# Patient Record
Sex: Male | Born: 1965 | Race: Black or African American | Hispanic: No | Marital: Married | Smoking: Never smoker
Health system: Southern US, Community
[De-identification: ages and names within clinical notes are randomized; demographics above are authoritative.]

## PROBLEM LIST (undated history)

## (undated) DIAGNOSIS — I10 Essential (primary) hypertension: Secondary | ICD-10-CM

## (undated) DIAGNOSIS — S83249A Other tear of medial meniscus, current injury, unspecified knee, initial encounter: Secondary | ICD-10-CM

## (undated) DIAGNOSIS — K219 Gastro-esophageal reflux disease without esophagitis: Secondary | ICD-10-CM

## (undated) HISTORY — PX: WISDOM TOOTH EXTRACTION: SHX21

---

## 2005-02-21 ENCOUNTER — Emergency Department (HOSPITAL_COMMUNITY): Admission: EM | Admit: 2005-02-21 | Discharge: 2005-02-21 | Payer: Self-pay | Admitting: Emergency Medicine
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
## 2007-05-17 ENCOUNTER — Ambulatory Visit: Payer: Self-pay | Admitting: Family Medicine

## 2007-05-17 DIAGNOSIS — G43009 Migraine without aura, not intractable, without status migrainosus: Secondary | ICD-10-CM | POA: Insufficient documentation

## 2009-08-15 ENCOUNTER — Ambulatory Visit: Payer: Self-pay | Admitting: Family Medicine

## 2012-02-21 ENCOUNTER — Emergency Department (HOSPITAL_COMMUNITY)
Admission: EM | Admit: 2012-02-21 | Discharge: 2012-02-21 | Disposition: A | Discharge status: Home / Self Care | Payer: BC Managed Care – PPO | Source: Home / Self Care | Attending: Emergency Medicine | Admitting: Emergency Medicine

## 2012-02-21 ENCOUNTER — Encounter (HOSPITAL_COMMUNITY): Payer: Self-pay | Admitting: *Deleted

## 2012-02-21 DIAGNOSIS — S40269A Insect bite (nonvenomous) of unspecified shoulder, initial encounter: Secondary | ICD-10-CM

## 2012-02-21 DIAGNOSIS — Z0000 Encounter for general adult medical examination without abnormal findings: Secondary | ICD-10-CM

## 2012-02-21 HISTORY — DX: Essential (primary) hypertension: I10

## 2012-02-21 NOTE — Discharge Instructions (Signed)
You do need to find a primary care physician. Below are practices accepting new patients. Return to the ED for any of the symptoms we talked about.   [facility]: [address] [place] [address] [phone]  [facility]: [address] [place] [address]   [phone]  [facility]: [address] [place] [address] [phone]  [facility] : [address] [place] [address] [phone]  [facility]: [address] [place] [address] [phone]  [facility]: [address] [place] [address] [phone]  Dr. Oneal Grout [address] [facility] [place] [address] [phone]

## 2012-02-21 NOTE — ED Notes (Signed)
Reports noticing tick to posterior right shoulder this morning "almost embedded in my skin".  States wife removed tick with tweezers.  Upon assessment of area, unable to determine exact location of tick bite.  Pt c/o some nausea today.

## 2012-02-21 NOTE — ED Provider Notes (Signed)
History     CSN: [id number]  Arrival date & time 02/21/12  1309   First MD Initiated Contact with Patient 02/21/12 1358      Chief Complaint  Patient presents with  . Tick Removal    (Consider location/radiation/quality/duration/timing/severity/associated sxs/prior treatment) HPI Comments: Patient presents for a variation of a possible tick bite. He states that he found a large, non-engorged tick embedded in his posterior right shoulder earlier today. His wife removed it with tweezers. He wants to make sure that she got "everything out". He does not know where the tick was exactly. Reports some nausea after having the tick removed, but no vomiting. No fevers, redness, pain at the site where the tick was. Tetanus is up-to-date.   ROS as noted in HPI. All other ROS negative.   The history is provided by the patient. No language interpreter was used.    Past Medical History  Diagnosis Date  . Hypertension     Was told "pre-hypertension" - no meds needed    History reviewed. No pertinent past surgical history.  History reviewed. No pertinent family history.  History  Substance Use Topics  . Smoking status: Never Smoker   . Smokeless tobacco: Not on file  . Alcohol Use: Yes     occasional      Review of Systems  Allergies  Review of patient's allergies indicates no known allergies.  Home Medications  No current outpatient prescriptions on file.  BP 130/90  Pulse 52  Temp 97.9 F (36.6 C) (Oral)  Resp 16  SpO2 98%  Physical Exam  Nursing note and vitals reviewed. Constitutional: He is oriented to person, place, and time. He appears well-developed and well-nourished.  HENT:  Head: Normocephalic and atraumatic.  Eyes: Conjunctivae and EOM are normal.  Neck: Normal range of motion.  Cardiovascular: Normal rate.   Pulmonary/Chest: Effort normal. No respiratory distress.  Abdominal: He exhibits no distension.  Musculoskeletal: Normal range of motion.   Right shoulder: Normal.       Skin intact, no signs of tick bite of right shoulder, or in right upper back  Neurological: He is alert and oriented to person, place, and time.  Skin: Skin is warm and dry.  Psychiatric: He has a normal mood and affect. His behavior is normal.    ED Course  Procedures (including critical care time)  Labs Reviewed - No data to display No results found.   1. General medical exam      MDM  Unable to find evidence of tick bite. Skin intact. Pt otherwise well. Discussed signs/symotoims that should prompt his return. Patient agrees with plan.  Luiz Blare, MD 02/21/12 [phone]

## 2015-12-13 ENCOUNTER — Ambulatory Visit (HOSPITAL_COMMUNITY)
Admission: EM | Admit: 2015-12-13 | Discharge: 2015-12-13 | Disposition: A | Discharge status: Home / Self Care | Payer: BLUE CROSS/BLUE SHIELD | Attending: Emergency Medicine | Admitting: Emergency Medicine

## 2015-12-13 ENCOUNTER — Encounter (HOSPITAL_COMMUNITY): Payer: Self-pay

## 2015-12-13 DIAGNOSIS — S86811A Strain of other muscle(s) and tendon(s) at lower leg level, right leg, initial encounter: Secondary | ICD-10-CM

## 2015-12-13 DIAGNOSIS — S86111A Strain of other muscle(s) and tendon(s) of posterior muscle group at lower leg level, right leg, initial encounter: Secondary | ICD-10-CM

## 2015-12-13 MED ORDER — TRAMADOL HCL 50 MG PO TABS: 50.0000 mg | ORAL_TABLET | Freq: Four times a day (QID) | ORAL | Status: DC | PRN

## 2015-12-13 NOTE — Discharge Instructions (Signed)
You have a partially torn your calf muscle. There is no sign of broken bone or completely ruptured muscle or tendon. Keep her leg elevated and apply ice as much as you can. This will help with the swelling. Take ibuprofen every 6 hours for pain. Keep it wrapped with Ace wrap for some compression. You can remove this to shower. Use the crutches for the next several days. You will likely be able to start bearing more weight in 2-3 days. This will take 2 weeks to fully heal. If things are not improving by next week, please follow-up with Dr. Lorin Mercy, an orthopedic specialist.
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
## 2016-02-14 ENCOUNTER — Encounter (HOSPITAL_COMMUNITY): Payer: Self-pay | Admitting: Emergency Medicine

## 2016-02-14 ENCOUNTER — Emergency Department (HOSPITAL_COMMUNITY)
Admission: EM | Admit: 2016-02-14 | Discharge: 2016-02-14 | Disposition: A | Discharge status: Home / Self Care | Payer: BLUE CROSS/BLUE SHIELD | Attending: Emergency Medicine | Admitting: Emergency Medicine

## 2016-02-14 DIAGNOSIS — Y92410 Unspecified street and highway as the place of occurrence of the external cause: Secondary | ICD-10-CM | POA: Insufficient documentation

## 2016-02-14 DIAGNOSIS — I10 Essential (primary) hypertension: Secondary | ICD-10-CM | POA: Insufficient documentation

## 2016-02-14 DIAGNOSIS — M542 Cervicalgia: Secondary | ICD-10-CM | POA: Insufficient documentation

## 2016-02-14 DIAGNOSIS — M25512 Pain in left shoulder: Secondary | ICD-10-CM | POA: Insufficient documentation

## 2016-02-14 DIAGNOSIS — Y939 Activity, unspecified: Secondary | ICD-10-CM | POA: Insufficient documentation

## 2016-02-14 DIAGNOSIS — Y999 Unspecified external cause status: Secondary | ICD-10-CM | POA: Diagnosis not present

## 2016-02-14 MED ORDER — METHOCARBAMOL 500 MG PO TABS: 500.0000 mg | ORAL_TABLET | Freq: Two times a day (BID) | ORAL | Status: DC

## 2016-02-14 MED ORDER — NAPROXEN 500 MG PO TABS: 500.0000 mg | ORAL_TABLET | Freq: Two times a day (BID) | ORAL | Status: DC
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
## 2017-08-12 DIAGNOSIS — M542 Cervicalgia: Secondary | ICD-10-CM | POA: Diagnosis not present

## 2017-08-12 DIAGNOSIS — M5416 Radiculopathy, lumbar region: Secondary | ICD-10-CM | POA: Diagnosis not present

## 2017-11-11 ENCOUNTER — Ambulatory Visit (INDEPENDENT_AMBULATORY_CARE_PROVIDER_SITE_OTHER): Payer: 59 | Admitting: Family Medicine

## 2017-11-11 ENCOUNTER — Encounter: Payer: Self-pay | Admitting: Family Medicine

## 2017-11-11 ENCOUNTER — Other Ambulatory Visit: Payer: Self-pay

## 2017-11-11 VITALS — BP 124/81 | HR 68 | Temp 98.7°F | Resp 16 | Ht 66.25 in | Wt 187.8 lb

## 2017-11-11 DIAGNOSIS — H1032 Unspecified acute conjunctivitis, left eye: Secondary | ICD-10-CM

## 2017-11-11 MED ORDER — POLYMYXIN B-TRIMETHOPRIM 10000-0.1 UNIT/ML-% OP SOLN: 1.0000 [drp] | Freq: Four times a day (QID) | OPHTHALMIC | 0 refills | Status: DC

## 2017-11-11 NOTE — Patient Instructions (Signed)
     IF you received an x-ray today, you will receive an invoice from [facility]. Please contact [facility] at [phone] with questions or concerns regarding your invoice.   IF you received labwork today, you will receive an invoice from [facility]. Please contact [facility] at [phone] with questions or concerns regarding your invoice.   Our billing staff will not be able to assist you with questions regarding bills from these companies.  You will be contacted with the lab results as soon as they are available. The fastest way to get your results is to activate your My Chart account. Instructions are located on the last page of this paperwork. If you have not heard from us regarding the results in 2 weeks, please contact this office.     

## 2017-11-11 NOTE — Progress Notes (Signed)
   3/14/201910:14 AM  Jared Park [birth year], 52 y.o. male [id number]  Chief Complaint  Patient presents with  . New Patient (Initial Visit)    ? conjunctivitis, st x 1 week.  no fevers noticed. Eyes red with mucus drainage this morning.    HPI:   Patient is a 52 y.o. male  who presents today for 1 day of red crusty eye. Has been having a sore throat and nasal congestion for past several days. He denies any vision changes, photophobia, pain in the eye, FB sensation. He denies any fever, chills, cough, ear pain, SOB.   Depression screen PHQ 2/9 11/11/2017  Decreased Interest 0  Down, Depressed, Hopeless 0  PHQ - 2 Score 0    No Known Allergies  Prior to Admission medications   Not on File    Past Medical History:  Diagnosis Date  . Hypertension    Was told "pre-hypertension" - no meds needed    History reviewed. No pertinent surgical history.  Social History   Tobacco Use  . Smoking status: Never Smoker  . Smokeless tobacco: Never Used  Substance Use Topics  . Alcohol use: Yes    Comment: occasional    Family History  Problem Relation Age of Onset  . Diabetes Mother   . Early death Mother   . Hyperlipidemia Mother   . Hypertension Mother   . Stroke Father     ROS Per hpi  OBJECTIVE:  Blood pressure 124/81, pulse 68, temperature 98.7 F (37.1 C), temperature source Oral, resp. rate 16, height 5' 6.25" (1.683 m), weight 187 lb 12.8 oz (85.2 kg), SpO2 96 %.   Physical Exam  Constitutional: He is oriented to person, place, and time and well-developed, well-nourished, and in no distress.  HENT:  Head: Normocephalic and atraumatic.  Right Ear: Hearing, tympanic membrane, external ear and ear canal normal.  Left Ear: Hearing, tympanic membrane, external ear and ear canal normal.  Mouth/Throat: Oropharynx is clear and moist. No oropharyngeal exudate.  Eyes: EOM are normal. Pupils are equal, round, and reactive to light. Right eye exhibits chemosis and  discharge. Left eye exhibits no chemosis and no discharge. Right conjunctiva is injected. Left conjunctiva is not injected.  Neck: Neck supple.  Cardiovascular: Normal rate and regular rhythm. Exam reveals no gallop and no friction rub.  No murmur heard. Pulmonary/Chest: Effort normal and breath sounds normal. He has no wheezes. He has no rales.  Lymphadenopathy:    He has no cervical adenopathy.  Neurological: He is alert and oriented to person, place, and time. Gait normal.  Skin: Skin is warm and dry.     ASSESSMENT and PLAN  1. Acute conjunctivitis of left eye, unspecified acute conjunctivitis type Discussed supportive measures, new meds r/se/b and RTC precautions. - trimethoprim-polymyxin b (POLYTRIM) ophthalmic solution; Place 1 drop into the left eye every 6 (six) hours.  Return if symptoms worsen or fail to improve.    Rutherford Guys, MD Primary Care at [place], [place] [address] Ph.  [phone] Fax [phone]

## 2017-11-29 ENCOUNTER — Other Ambulatory Visit: Payer: Self-pay | Admitting: Family Medicine

## 2017-11-29 MED ORDER — POLYMYXIN B-TRIMETHOPRIM 10000-0.1 UNIT/ML-% OP SOLN: 1.0000 [drp] | Freq: Four times a day (QID) | OPHTHALMIC | 1 refills | Status: DC

## 2018-02-23 ENCOUNTER — Other Ambulatory Visit: Payer: Self-pay

## 2018-02-23 ENCOUNTER — Ambulatory Visit (INDEPENDENT_AMBULATORY_CARE_PROVIDER_SITE_OTHER): Payer: 59 | Admitting: Family Medicine

## 2018-02-23 ENCOUNTER — Encounter: Payer: Self-pay | Admitting: Family Medicine

## 2018-02-23 VITALS — BP 120/86 | HR 63 | Temp 98.7°F | Ht 67.72 in | Wt 185.0 lb

## 2018-02-23 DIAGNOSIS — Z1329 Encounter for screening for other suspected endocrine disorder: Secondary | ICD-10-CM

## 2018-02-23 DIAGNOSIS — Z23 Encounter for immunization: Secondary | ICD-10-CM

## 2018-02-23 DIAGNOSIS — Z13220 Encounter for screening for lipoid disorders: Secondary | ICD-10-CM | POA: Diagnosis not present

## 2018-02-23 DIAGNOSIS — Z13228 Encounter for screening for other metabolic disorders: Secondary | ICD-10-CM

## 2018-02-23 DIAGNOSIS — Z130 Encounter for screening for diseases of the blood and blood-forming organs and certain disorders involving the immune mechanism: Secondary | ICD-10-CM

## 2018-02-23 DIAGNOSIS — Z125 Encounter for screening for malignant neoplasm of prostate: Secondary | ICD-10-CM | POA: Diagnosis not present

## 2018-02-23 DIAGNOSIS — Z0000 Encounter for general adult medical examination without abnormal findings: Secondary | ICD-10-CM | POA: Diagnosis not present

## 2018-02-23 NOTE — Progress Notes (Signed)
6/26/201910:43 AM  Jared Park 10-28-1965, 52 y.o. male [id number]  Chief Complaint  Patient presents with  . Annual Exam    HPI:   Patient is a 52 y.o. male who presents today for CPE  Colorectal Cancer Screening: not of age Prostate Cancer Screening: needs today Seasonal Influenza Vaccination: will need this season Td/Tdap Vaccination: needs today Pneumococcal Vaccination: not of age Zoster Vaccination: not of age Frequency of Dental evaluation: Q6 months Frequency of Eye evaluation: none, no eye concerns   Fall Risk  02/23/2018 11/11/2017  Falls in the past year? No No     Depression screen [facility] 2/9 02/23/2018 11/11/2017  Decreased Interest 0 0  Down, Depressed, Hopeless 0 0  PHQ - 2 Score 0 0    No Known Allergies  Prior to Admission medications   Not on File    Past Medical History:  Diagnosis Date  . Hypertension    Was told "pre-hypertension" - no meds needed    History reviewed. No pertinent surgical history.  Social History   Tobacco Use  . Smoking status: Never Smoker  . Smokeless tobacco: Never Used  Substance Use Topics  . Alcohol use: Yes    Comment: occasional    Family History  Problem Relation Age of Onset  . Diabetes Mother   . Hyperlipidemia Mother   . Hypertension Mother   . Stroke Father   . Healthy Sister   . Healthy Brother     Review of Systems  Constitutional: Negative for chills and fever.  HENT: Negative for congestion, ear pain, hearing loss and sore throat.   Eyes: Negative for blurred vision and double vision.  Respiratory: Negative for cough and shortness of breath.   Cardiovascular: Negative for chest pain, palpitations and leg swelling.  Gastrointestinal: Negative for abdominal pain, nausea and vomiting.  Genitourinary: Negative for dysuria, frequency and hematuria.  Musculoskeletal: Negative for falls, joint pain and myalgias.  Neurological: Negative for dizziness, tingling and headaches.    Psychiatric/Behavioral: Negative for depression. The patient is not nervous/anxious and does not have insomnia.   All other systems reviewed and are negative.    OBJECTIVE:  Blood pressure 120/86, pulse 63, temperature 98.7 F (37.1 C), temperature source Oral, height 5' 7.72" (1.72 m), weight 185 lb (83.9 kg), SpO2 98 %.  Body mass index is 28.36 kg/m.    Visual Acuity Screening   Right eye Left eye Both eyes  Without correction: 20/15 20/20 20/20   With correction:       Physical Exam  Constitutional: He is oriented to person, place, and time. He appears well-developed and well-nourished.  HENT:  Head: Normocephalic and atraumatic.  Right Ear: Hearing, tympanic membrane, external ear and ear canal normal.  Left Ear: Hearing, tympanic membrane, external ear and ear canal normal.  Mouth/Throat: Oropharynx is clear and moist. No oropharyngeal exudate.  Eyes: Pupils are equal, round, and reactive to light. Conjunctivae and EOM are normal.  Neck: Neck supple. No thyromegaly present.  Cardiovascular: Normal rate, regular rhythm, normal heart sounds and intact distal pulses. Exam reveals no gallop and no friction rub.  No murmur heard. Pulmonary/Chest: Effort normal and breath sounds normal. He has no wheezes. He has no rhonchi. He has no rales.  Abdominal: Soft. Bowel sounds are normal. He exhibits no distension and no mass. There is no tenderness.  Musculoskeletal: Normal range of motion. He exhibits no edema.  Lymphadenopathy:    He has no cervical adenopathy.  Neurological: He is alert  and oriented to person, place, and time. He has normal strength and normal reflexes. No cranial nerve deficit. Coordination and gait normal.  Skin: Skin is warm and dry.  Psychiatric: He has a normal mood and affect.  Nursing note and vitals reviewed.   ASSESSMENT and PLAN  1. Annual physical exam No concerns per history or exam. Routine HCM labs ordered. HCM reviewed/discussed. Anticipatory  guidance regarding healthy weight, lifestyle and choices given.   2. Screening for metabolic disorder - JPE16+KOEC  3. Screening for prostate cancer - PSA  4. Screening for lipid disorders - Lipid panel  5. Screening for deficiency anemia - CBC with Differential/Platelet  6. Screening for thyroid disorder - TSH  7. Need for vaccination - Td vaccine greater than or equal to 7yo preservative free IM  Return in about 1 year (around 02/24/2019).    Rutherford Guys, MD Primary Care at [name], [place] [address] Ph.  [phone] Fax [phone]

## 2018-02-23 NOTE — Patient Instructions (Addendum)
   IF you received an x-ray today, you will receive an invoice from [facility]. Please contact [facility] at [phone] with questions or concerns regarding your invoice.   IF you received labwork today, you will receive an invoice from [facility]. Please contact [facility] at [phone] with questions or concerns regarding your invoice.   Our billing staff will not be able to assist you with questions regarding bills from these companies.  You will be contacted with the lab results as soon as they are available. The fastest way to get your results is to activate your My Chart account. Instructions are located on the last page of this paperwork. If you have not heard from us regarding the results in 2 weeks, please contact this office.     Preventive Care 40-64 Years, Male Preventive care refers to lifestyle choices and visits with your health care provider that can promote health and wellness. What does preventive care include?  A yearly physical exam. This is also called an annual well check.  Dental exams once or twice a year.  Routine eye exams. Ask your health care provider how often you should have your eyes checked.  Personal lifestyle choices, including: ? Daily care of your teeth and gums. ? Regular physical activity. ? Eating a healthy diet. ? Avoiding tobacco and drug use. ? Limiting alcohol use. ? Practicing safe sex. ? Taking low-dose aspirin every day starting at age 50. What happens during an annual well check? The services and screenings done by your health care provider during your annual well check will depend on your age, overall health, lifestyle risk factors, and family history of disease. Counseling Your health care provider may ask you questions about your:  Alcohol use.  Tobacco use.  Drug use.  Emotional well-being.  Home and relationship well-being.  Sexual activity.  Eating habits.  Work and work  environment.  Screening You may have the following tests or measurements:  Height, weight, and BMI.  Blood pressure.  Lipid and cholesterol levels. These may be checked every 5 years, or more frequently if you are over 50 years old.  Skin check.  Lung cancer screening. You may have this screening every year starting at age 55 if you have a 30-pack-year history of smoking and currently smoke or have quit within the past 15 years.  Fecal occult blood test (FOBT) of the stool. You may have this test every year starting at age 50.  Flexible sigmoidoscopy or colonoscopy. You may have a sigmoidoscopy every 5 years or a colonoscopy every 10 years starting at age 50.  Prostate cancer screening. Recommendations will vary depending on your family history and other risks.  Hepatitis C blood test.  Hepatitis B blood test.  Sexually transmitted disease (STD) testing.  Diabetes screening. This is done by checking your blood sugar (glucose) after you have not eaten for a while (fasting). You may have this done every 1-3 years.  Discuss your test results, treatment options, and if necessary, the need for more tests with your health care provider. Vaccines Your health care provider may recommend certain vaccines, such as:  Influenza vaccine. This is recommended every year.  Tetanus, diphtheria, and acellular pertussis (Tdap, Td) vaccine. You may need a Td booster every 10 years.  Varicella vaccine. You may need this if you have not been vaccinated.  Zoster vaccine. You may need this after age 60.  Measles, mumps, and rubella (MMR) vaccine. You may need at least one dose of   MMR if you were born in 1957 or later. You may also need a second dose.  Pneumococcal 13-valent conjugate (PCV13) vaccine. You may need this if you have certain conditions and have not been vaccinated.  Pneumococcal polysaccharide (PPSV23) vaccine. You may need one or two doses if you smoke cigarettes or if you have  certain conditions.  Meningococcal vaccine. You may need this if you have certain conditions.  Hepatitis A vaccine. You may need this if you have certain conditions or if you travel or work in places where you may be exposed to hepatitis A.  Hepatitis B vaccine. You may need this if you have certain conditions or if you travel or work in places where you may be exposed to hepatitis B.  Haemophilus influenzae type b (Hib) vaccine. You may need this if you have certain risk factors.  Talk to your health care provider about which screenings and vaccines you need and how often you need them. This information is not intended to replace advice given to you by your health care provider. Make sure you discuss any questions you have with your health care provider. Document Released: 09/13/2015 Document Revised: 05/06/2016 Document Reviewed: 06/18/2015 Elsevier Interactive Patient Education  2018 [employer]  

## 2018-02-24 LAB — CMP14+EGFR
ALT: 23 IU/L (ref 0–44)
AST: 16 IU/L (ref 0–40)
Albumin/Globulin Ratio: 1.7 (ref 1.2–2.2)
Albumin: 4.2 g/dL (ref 3.5–5.5)
Alkaline Phosphatase: 72 IU/L (ref 39–117)
BUN/Creatinine Ratio: 9 (ref 9–20)
BUN: 10 mg/dL (ref 6–24)
Bilirubin Total: 0.4 mg/dL (ref 0.0–1.2)
CO2: 23 mmol/L (ref 20–29)
Calcium: 9 mg/dL (ref 8.7–10.2)
Chloride: 102 mmol/L (ref 96–106)
Creatinine, Ser: 1.11 mg/dL (ref 0.76–1.27)
GFR calc Af Amer: 90 mL/min/{1.73_m2} (ref >59)
GFR calc non Af Amer: 78 mL/min/{1.73_m2} (ref >59)
Globulin, Total: 2.5 g/dL (ref 1.5–4.5)
Glucose: 102 mg/dL — ABNORMAL HIGH (ref 65–99)
Potassium: 4.1 mmol/L (ref 3.5–5.2)
Sodium: 140 mmol/L (ref 134–144)
Total Protein: 6.7 g/dL (ref 6.0–8.5)

## 2018-02-24 LAB — CBC WITH DIFFERENTIAL/PLATELET
Basophils Absolute: 0 10*3/uL (ref 0.0–0.2)
Basos: 1 %
EOS (ABSOLUTE): 0 10*3/uL (ref 0.0–0.4)
Eos: 1 %
Hematocrit: 42 % (ref 37.5–51.0)
Hemoglobin: 13.7 g/dL (ref 13.0–17.7)
Immature Grans (Abs): 0 10*3/uL (ref 0.0–0.1)
Immature Granulocytes: 0 %
Lymphocytes Absolute: 1.5 10*3/uL (ref 0.7–3.1)
Lymphs: 39 %
MCH: 27.8 pg (ref 26.6–33.0)
MCHC: 32.6 g/dL (ref 31.5–35.7)
MCV: 85 fL (ref 79–97)
Monocytes Absolute: 0.4 10*3/uL (ref 0.1–0.9)
Monocytes: 9 %
Neutrophils Absolute: 1.9 10*3/uL (ref 1.4–7.0)
Neutrophils: 50 %
Platelets: 310 10*3/uL (ref 150–450)
RBC: 4.93 x10E6/uL (ref 4.14–5.80)
RDW: 13.8 % (ref 12.3–15.4)
WBC: 3.8 10*3/uL (ref 3.4–10.8)

## 2018-02-24 LAB — LIPID PANEL
Chol/HDL Ratio: 3.8 ratio (ref 0.0–5.0)
Cholesterol, Total: 204 mg/dL — ABNORMAL HIGH (ref 100–199)
HDL: 53 mg/dL (ref >39)
LDL Calculated: 131 mg/dL — ABNORMAL HIGH (ref 0–99)
Triglycerides: 101 mg/dL (ref 0–149)
VLDL Cholesterol Cal: 20 mg/dL (ref 5–40)

## 2018-02-24 LAB — PSA: Prostate Specific Ag, Serum: 0.4 ng/mL (ref 0.0–4.0)

## 2018-02-24 LAB — TSH: TSH: 1.45 u[IU]/mL (ref 0.450–4.500)

## 2018-03-09 ENCOUNTER — Encounter: Payer: Self-pay | Admitting: Family Medicine

## 2018-05-09 DIAGNOSIS — S335XXA Sprain of ligaments of lumbar spine, initial encounter: Secondary | ICD-10-CM | POA: Diagnosis not present

## 2018-06-28 DIAGNOSIS — M25512 Pain in left shoulder: Secondary | ICD-10-CM | POA: Diagnosis not present

## 2018-06-28 DIAGNOSIS — S139XXA Sprain of joints and ligaments of unspecified parts of neck, initial encounter: Secondary | ICD-10-CM | POA: Diagnosis not present

## 2018-08-01 DIAGNOSIS — M25522 Pain in left elbow: Secondary | ICD-10-CM | POA: Diagnosis not present

## 2018-08-08 DIAGNOSIS — M7712 Lateral epicondylitis, left elbow: Secondary | ICD-10-CM | POA: Diagnosis not present

## 2019-04-19 ENCOUNTER — Telehealth: Payer: Self-pay

## 2019-04-19 NOTE — Telephone Encounter (Signed)

## 2019-04-20 ENCOUNTER — Encounter: Payer: Self-pay | Admitting: Family Medicine

## 2019-04-20 ENCOUNTER — Encounter: Payer: Self-pay | Admitting: Gastroenterology

## 2019-04-20 ENCOUNTER — Ambulatory Visit (INDEPENDENT_AMBULATORY_CARE_PROVIDER_SITE_OTHER): Payer: 59 | Admitting: Family Medicine

## 2019-04-20 VITALS — BP 128/78 | HR 66 | Temp 96.5°F | Ht 67.72 in | Wt 191.4 lb

## 2019-04-20 DIAGNOSIS — Z23 Encounter for immunization: Secondary | ICD-10-CM

## 2019-04-20 DIAGNOSIS — Z0000 Encounter for general adult medical examination without abnormal findings: Secondary | ICD-10-CM

## 2019-04-20 LAB — CBC
HCT: 43.7 % (ref 39.0–52.0)
Hemoglobin: 13.9 g/dL (ref 13.0–17.0)
MCHC: 31.8 g/dL (ref 30.0–36.0)
MCV: 88.4 fl (ref 78.0–100.0)
Platelets: 309 10*3/uL (ref 150.0–400.0)
RBC: 4.94 Mil/uL (ref 4.22–5.81)
RDW: 14.1 % (ref 11.5–15.5)
WBC: 4.1 10*3/uL (ref 4.0–10.5)

## 2019-04-20 LAB — LIPID PANEL
Cholesterol: 251 mg/dL — ABNORMAL HIGH (ref 0–200)
HDL: 50.5 mg/dL (ref >39.00)
LDL Cholesterol: 170 mg/dL — ABNORMAL HIGH (ref 0–99)
NonHDL: 200.78
Total CHOL/HDL Ratio: 5
Triglycerides: 154 mg/dL — ABNORMAL HIGH (ref 0.0–149.0)
VLDL: 30.8 mg/dL (ref 0.0–40.0)

## 2019-04-20 LAB — COMPREHENSIVE METABOLIC PANEL
ALT: 21 U/L (ref 0–53)
AST: 14 U/L (ref 0–37)
Albumin: 4.3 g/dL (ref 3.5–5.2)
Alkaline Phosphatase: 60 U/L (ref 39–117)
BUN: 14 mg/dL (ref 6–23)
CO2: 26 mEq/L (ref 19–32)
Calcium: 9.1 mg/dL (ref 8.4–10.5)
Chloride: 103 mEq/L (ref 96–112)
Creatinine, Ser: 1.13 mg/dL (ref 0.40–1.50)
GFR: 82.83 mL/min (ref >60.00)
Glucose, Bld: 103 mg/dL — ABNORMAL HIGH (ref 70–99)
Potassium: 4.2 mEq/L (ref 3.5–5.1)
Sodium: 140 mEq/L (ref 135–145)
Total Bilirubin: 0.7 mg/dL (ref 0.2–1.2)
Total Protein: 7 g/dL (ref 6.0–8.3)

## 2019-04-20 LAB — URINALYSIS, ROUTINE W REFLEX MICROSCOPIC
Bilirubin Urine: NEGATIVE
Hgb urine dipstick: NEGATIVE
Ketones, ur: NEGATIVE
Leukocytes,Ua: NEGATIVE
Nitrite: NEGATIVE
RBC / HPF: NONE SEEN (ref 0–?)
Specific Gravity, Urine: 1.025 (ref 1.000–1.030)
Total Protein, Urine: NEGATIVE
Urine Glucose: NEGATIVE
Urobilinogen, UA: 0.2 (ref 0.0–1.0)
pH: 5.5 (ref 5.0–8.0)

## 2019-04-20 LAB — PSA: PSA: 0.36 ng/mL (ref 0.10–4.00)

## 2019-04-20 NOTE — Progress Notes (Addendum)
Established Patient Office Visit  Subjective:  Patient ID: Jared Schlabach., male    DOB: 10/22/1965  Age: 53 y.o. MRN: [id number]  CC:  Chief Complaint  Patient presents with  . Annual Exam    HPI Jared S Shisler Jr. presents for a complete physical.  He is fasting this morning.  He has no health problems that he is aware of.  He is married and lives with his wife.  Works at [employer].  Unable to see the dentist this year.  Has never had an eye check or colonoscopy.  Interested in having a flu shot this year.  Has not been exercising recently.  He does not smoke.  He does drink 2-3 beers 4 to 5 days a week.  Sometimes more on weekends.  Father is 93 and sounds like he is suffering from dementia.  Mom passed at an earlier age and had multiple core issues.  Past Medical History:  Diagnosis Date  . Hypertension    Was told "pre-hypertension" - no meds needed    History reviewed. No pertinent surgical history.  Family History  Problem Relation Age of Onset  . Diabetes Mother   . Hyperlipidemia Mother   . Hypertension Mother   . Stroke Father   . Healthy Sister   . Healthy Brother     Social History   Socioeconomic History  . Marital status: Married    Spouse name: Not on file  . Number of children: Not on file  . Years of education: Not on file  . Highest education level: Not on file  Occupational History  . Not on file  Social Needs  . Financial resource strain: Not on file  . Food insecurity    Worry: Not on file    Inability: Not on file  . Transportation needs    Medical: Not on file    Non-medical: Not on file  Tobacco Use  . Smoking status: Never Smoker  . Smokeless tobacco: Never Used  Substance and Sexual Activity  . Alcohol use: Yes    Comment: 2-3 beers 4-5 days weekly  . Drug use: No  . Sexual activity: Yes  Lifestyle  . Physical activity    Days per week: Not on file    Minutes per session: Not on file  . Stress: Not on file   Relationships  . Social Herbalist on phone: Not on file    Gets together: Not on file    Attends religious service: Not on file    Active member of club or organization: Not on file    Attends meetings of clubs or organizations: Not on file    Relationship status: Not on file  . Intimate partner violence    Fear of current or ex partner: Not on file    Emotionally abused: Not on file    Physically abused: Not on file    Forced sexual activity: Not on file  Other Topics Concern  . Not on file  Social History Narrative  . Not on file    No outpatient medications prior to visit.   No facility-administered medications prior to visit.     No Known Allergies  ROS Review of Systems  Constitutional: Negative.   HENT: Negative.   Eyes: Negative for photophobia and visual disturbance.  Respiratory: Negative.   Cardiovascular: Negative.   Gastrointestinal: Negative.   Endocrine: Negative for polyphagia and polyuria.  Genitourinary: Negative.   Musculoskeletal:  Negative.   Skin: Negative for pallor and rash.  Allergic/Immunologic: Negative for immunocompromised state.  Neurological: Negative for tremors, speech difficulty and weakness.  Hematological: Negative.   Psychiatric/Behavioral: Negative.       Objective:    Physical Exam  Constitutional: He is oriented to person, place, and time. He appears well-developed and well-nourished. No distress.  HENT:  Head: Normocephalic and atraumatic.  Right Ear: External ear normal.  Left Ear: External ear normal.  Mouth/Throat: Oropharynx is clear and moist. No oropharyngeal exudate.  Eyes: Pupils are equal, round, and reactive to light. Conjunctivae are normal. Right eye exhibits no discharge. Left eye exhibits no discharge. No scleral icterus.  Neck: Neck supple. No JVD present. No tracheal deviation present. No thyromegaly present.  Cardiovascular: Normal rate, regular rhythm and normal heart sounds.   Pulmonary/Chest: Effort normal and breath sounds normal. No stridor.  Abdominal: Soft. Bowel sounds are normal. He exhibits no distension. There is no abdominal tenderness. There is no rebound and no guarding.  Genitourinary: Rectum:     Guaiac result negative.     No rectal mass, anal fissure, tenderness, external hemorrhoid, internal hemorrhoid or abnormal anal tone.  Prostate is enlarged. Prostate is not tender.  Musculoskeletal:        General: No edema.     Right shoulder: He exhibits normal range of motion.     Left shoulder: He exhibits normal range of motion.     Lumbar back: He exhibits normal range of motion and no bony tenderness.  Lymphadenopathy:    He has no cervical adenopathy.  Neurological: He is alert and oriented to person, place, and time. He has normal strength. He is not disoriented.  Skin: Skin is warm and dry. He is not diaphoretic.  Psychiatric: He has a normal mood and affect. His behavior is normal.    BP 128/78   Pulse 66   Temp (!) 96.5 F (35.8 C) (Temporal)   Ht 5' 7.72" (1.72 m)   Wt 191 lb 6 oz (86.8 kg)   SpO2 98%   BMI 29.34 kg/m  Wt Readings from Last 3 Encounters:  04/20/19 191 lb 6 oz (86.8 kg)  02/23/18 185 lb (83.9 kg)  11/11/17 187 lb 12.8 oz (85.2 kg)   BP Readings from Last 3 Encounters:  04/20/19 128/78  02/23/18 120/86  11/11/17 124/81   Guideline developer:  UpToDate (see UpToDate for funding source) Date Released: June 2014  Health Maintenance Due  Topic Date Due  . COLONOSCOPY  06/12/2018    There are no preventive care reminders to display for this patient.  Lab Results  Component Value Date   TSH 1.450 02/23/2018   Lab Results  Component Value Date   WBC 4.1 04/20/2019   HGB 13.9 04/20/2019   HCT 43.7 04/20/2019   MCV 88.4 04/20/2019   PLT 309.0 04/20/2019   Lab Results  Component Value Date   NA 140 04/20/2019   K 4.2 04/20/2019   CO2 26 04/20/2019   GLUCOSE 103 (H) 04/20/2019   BUN 14 04/20/2019    CREATININE 1.13 04/20/2019   BILITOT 0.7 04/20/2019   ALKPHOS 60 04/20/2019   AST 14 04/20/2019   ALT 21 04/20/2019   PROT 7.0 04/20/2019   ALBUMIN 4.3 04/20/2019   CALCIUM 9.1 04/20/2019   GFR 82.83 04/20/2019   Lab Results  Component Value Date   CHOL 251 (H) 04/20/2019   Lab Results  Component Value Date   HDL 50.50 04/20/2019  Lab Results  Component Value Date   LDLCALC 170 (H) 04/20/2019   Lab Results  Component Value Date   TRIG 154.0 (H) 04/20/2019   Lab Results  Component Value Date   CHOLHDL 5 04/20/2019   No results found for: HGBA1C    Assessment & Plan:   Problem List Items Addressed This Visit    None    Visit Diagnoses    Healthcare maintenance    -  Primary   Relevant Orders   CBC (Completed)   Comprehensive metabolic panel (Completed)   Lipid panel (Completed)   PSA (Completed)   Urinalysis, Routine w reflex microscopic (Completed)   Ambulatory referral to Gastroenterology   Ambulatory referral to Ophthalmology   Need for immunization against influenza       Relevant Orders   Flu Vaccine QUAD 36+ mos IM (Completed)      No orders of the defined types were placed in this encounter.   Follow-up: Return in about 6 months (around 10/21/2019), or if symptoms worsen or fail to improve.   Patient has dental exam scheduled for November.  Encouraged him to restart his exercise program.  Discussed normal alcohol usage that would be no more than 2 servings in a given setting.  Agrees to go for colonoscopy and an eye check.  Given information on health maintenance and disease prevention.  8/21: Ldl cholesterol is significantly elevated. Have asked patient to lower fat and cholesterol in diet and fu in 6 mos for a recheck.

## 2019-04-20 NOTE — Patient Instructions (Addendum)
Health Maintenance, Male Adopting a healthy lifestyle and getting preventive care are important in promoting health and wellness. Ask your health care provider about:  The right schedule for you to have regular tests and exams.  Things you can do on your own to prevent diseases and keep yourself healthy. What should I know about diet, weight, and exercise? Eat a healthy diet   Eat a diet that includes plenty of vegetables, fruits, low-fat dairy products, and lean protein.  Do not eat a lot of foods that are high in solid fats, added sugars, or sodium. Maintain a healthy weight Body mass index (BMI) is a measurement that can be used to identify possible weight problems. It estimates body fat based on height and weight. Your health care provider can help determine your BMI and help you achieve or maintain a healthy weight. Get regular exercise Get regular exercise. This is one of the most important things you can do for your health. Most adults should:  Exercise for at least 150 minutes each week. The exercise should increase your heart rate and make you sweat (moderate-intensity exercise).  Do strengthening exercises at least twice a week. This is in addition to the moderate-intensity exercise.  Spend less time sitting. Even light physical activity can be beneficial. Watch cholesterol and blood lipids Have your blood tested for lipids and cholesterol at 51 years of age, then have this test every 5 years. You may need to have your cholesterol levels checked more often if:  Your lipid or cholesterol levels are high.  You are older than 51 years of age.  You are at high risk for heart disease. What should I know about cancer screening? Many types of cancers can be detected early and may often be prevented. Depending on your health history and family history, you may need to have cancer screening at various ages. This may include screening for:  Colorectal cancer.  Prostate cancer.   Skin cancer.  Lung cancer. What should I know about heart disease, diabetes, and high blood pressure? Blood pressure and heart disease  High blood pressure causes heart disease and increases the risk of stroke. This is more likely to develop in people who have high blood pressure readings, are of African descent, or are overweight.  Talk with your health care provider about your target blood pressure readings.  Have your blood pressure checked: ? Every 3-5 years if you are 18-39 years of age. ? Every year if you are 40 years old or older.  If you are between the ages of 65 and 75 and are a current or former smoker, ask your health care provider if you should have a one-time screening for abdominal aortic aneurysm (AAA). Diabetes Have regular diabetes screenings. This checks your fasting blood sugar level. Have the screening done:  Once every three years after age 45 if you are at a normal weight and have a low risk for diabetes.  More often and at a younger age if you are overweight or have a high risk for diabetes. What should I know about preventing infection? Hepatitis B If you have a higher risk for hepatitis B, you should be screened for this virus. Talk with your health care provider to find out if you are at risk for hepatitis B infection. Hepatitis C Blood testing is recommended for:  Everyone born from 1945 through 1965.  Anyone with known risk factors for hepatitis C. Sexually transmitted infections (STIs)  You should be screened each year   for STIs, including gonorrhea and chlamydia, if: ? You are sexually active and are younger than 51 years of age. ? You are older than 51 years of age and your health care provider tells you that you are at risk for this type of infection. ? Your sexual activity has changed since you were last screened, and you are at increased risk for chlamydia or gonorrhea. Ask your health care provider if you are at risk.  Ask your health care  provider about whether you are at high risk for HIV. Your health care provider may recommend a prescription medicine to help prevent HIV infection. If you choose to take medicine to prevent HIV, you should first get tested for HIV. You should then be tested every 3 months for as long as you are taking the medicine. Follow these instructions at home: Lifestyle  Do not use any products that contain nicotine or tobacco, such as cigarettes, e-cigarettes, and chewing tobacco. If you need help quitting, ask your health care provider.  Do not use street drugs.  Do not share needles.  Ask your health care provider for help if you need support or information about quitting drugs. Alcohol use  Do not drink alcohol if your health care provider tells you not to drink.  If you drink alcohol: ? Limit how much you have to 0-2 drinks a day. ? Be aware of how much alcohol is in your drink. In the [place] one drink equals one 12 oz bottle of beer (355 mL), one 5 oz glass of wine (148 mL), or one 1 oz glass of hard liquor (44 mL). General instructions  Schedule regular health, dental, and eye exams.  Stay current with your vaccines.  Tell your health care provider if: ? You often feel depressed. ? You have ever been abused or do not feel safe at home. Summary  Adopting a healthy lifestyle and getting preventive care are important in promoting health and wellness.  Follow your health care provider's instructions about healthy diet, exercising, and getting tested or screened for diseases.  Follow your health care provider's instructions on monitoring your cholesterol and blood pressure. This information is not intended to replace advice given to you by your health care provider. Make sure you discuss any questions you have with your health care provider. Document Released: 02/13/2008 Document Revised: 08/10/2018 Document Reviewed: 08/10/2018 Elsevier Patient Education  2020 [employer]  Preventive  Care 83-25 Years Old, Male Preventive care refers to lifestyle choices and visits with your health care provider that can promote health and wellness. This includes:  A yearly physical exam. This is also called an annual well check.  Regular dental and eye exams.  Immunizations.  Screening for certain conditions.  Healthy lifestyle choices, such as eating a healthy diet, getting regular exercise, not using drugs or products that contain nicotine and tobacco, and limiting alcohol use. What can I expect for my preventive care visit? Physical exam Your health care provider will check:  Height and weight. These may be used to calculate body mass index (BMI), which is a measurement that tells if you are at a healthy weight.  Heart rate and blood pressure.  Your skin for abnormal spots. Counseling Your health care provider may ask you questions about:  Alcohol, tobacco, and drug use.  Emotional well-being.  Home and relationship well-being.  Sexual activity.  Eating habits.  Work and work Statistician. What immunizations do I need?  Influenza (flu) vaccine  This is recommended every  year. Tetanus, diphtheria, and pertussis (Tdap) vaccine  You may need a Td booster every 10 years. Varicella (chickenpox) vaccine  You may need this vaccine if you have not already been vaccinated. Zoster (shingles) vaccine  You may need this after age 70. Measles, mumps, and rubella (MMR) vaccine  You may need at least one dose of MMR if you were born in 1957 or later. You may also need a second dose. Pneumococcal conjugate (PCV13) vaccine  You may need this if you have certain conditions and were not previously vaccinated. Pneumococcal polysaccharide (PPSV23) vaccine  You may need one or two doses if you smoke cigarettes or if you have certain conditions. Meningococcal conjugate (MenACWY) vaccine  You may need this if you have certain conditions. Hepatitis A vaccine  You may need  this if you have certain conditions or if you travel or work in places where you may be exposed to hepatitis A. Hepatitis B vaccine  You may need this if you have certain conditions or if you travel or work in places where you may be exposed to hepatitis B. Haemophilus influenzae type b (Hib) vaccine  You may need this if you have certain risk factors. Human papillomavirus (HPV) vaccine  If recommended by your health care provider, you may need three doses over 6 months. You may receive vaccines as individual doses or as more than one vaccine together in one shot (combination vaccines). Talk with your health care provider about the risks and benefits of combination vaccines. What tests do I need? Blood tests  Lipid and cholesterol levels. These may be checked every 5 years, or more frequently if you are over 25 years old.  Hepatitis C test.  Hepatitis B test. Screening  Lung cancer screening. You may have this screening every year starting at age 40 if you have a 30-pack-year history of smoking and currently smoke or have quit within the past 15 years.  Prostate cancer screening. Recommendations will vary depending on your family history and other risks.  Colorectal cancer screening. All adults should have this screening starting at age 77 and continuing until age 10. Your health care provider may recommend screening at age 33 if you are at increased risk. You will have tests every 1-10 years, depending on your results and the type of screening test.  Diabetes screening. This is done by checking your blood sugar (glucose) after you have not eaten for a while (fasting). You may have this done every 1-3 years.  Sexually transmitted disease (STD) testing. Follow these instructions at home: Eating and drinking  Eat a diet that includes fresh fruits and vegetables, whole grains, lean protein, and low-fat dairy products.  Take vitamin and mineral supplements as recommended by your health  care provider.  Do not drink alcohol if your health care provider tells you not to drink.  If you drink alcohol: ? Limit how much you have to 0-2 drinks a day. ? Be aware of how much alcohol is in your drink. In the [place] one drink equals one 12 oz bottle of beer (355 mL), one 5 oz glass of wine (148 mL), or one 1 oz glass of hard liquor (44 mL). Lifestyle  Take daily care of your teeth and gums.  Stay active. Exercise for at least 30 minutes on 5 or more days each week.  Do not use any products that contain nicotine or tobacco, such as cigarettes, e-cigarettes, and chewing tobacco. If you need help quitting, ask your health care  provider.  If you are sexually active, practice safe sex. Use a condom or other form of protection to prevent STIs (sexually transmitted infections).  Talk with your health care provider about taking a low-dose aspirin every day starting at age 19. What's next?  Go to your health care provider once a year for a well check visit.  Ask your health care provider how often you should have your eyes and teeth checked.  Stay up to date on all vaccines. This information is not intended to replace advice given to you by your health care provider. Make sure you discuss any questions you have with your health care provider. Document Released: 09/13/2015 Document Revised: 08/11/2018 Document Reviewed: 08/11/2018 Elsevier Patient Education  2020 [place] and Cholesterol Restricted Eating Plan Eating a diet that limits fat and cholesterol may help lower your risk for heart disease and other conditions. Your body needs fat and cholesterol for basic functions, but eating too much of these things can be harmful to your health. Your health care provider may order lab tests to check your blood fat (lipid) and cholesterol levels. This helps your health care provider understand your risk for certain conditions and whether you need to make diet changes. Work with  your health care provider or dietitian to make an eating plan that is right for you. Your plan includes:  Limit your fat intake to ______% or less of your total calories a day.  Limit your saturated fat intake to ______% or less of your total calories a day.  Limit the amount of cholesterol in your diet to less than _________mg a day.  Eat ___________ g of fiber a day. What are tips for following this plan? General guidelines   If you are overweight, work with your health care provider to lose weight safely. Losing just 5-10% of your body weight can improve your overall health and help prevent diseases such as diabetes and heart disease.  Avoid: ? Foods with added sugar. ? Fried foods. ? Foods that contain partially hydrogenated oils, including stick margarine, some tub margarines, cookies, crackers, and other baked goods.  Limit alcohol intake to no more than 1 drink a day for nonpregnant women and 2 drinks a day for men. One drink equals 12 oz of beer, 5 oz of wine, or 1 oz of hard liquor. Reading food labels  Check food labels for: ? Trans fats, partially hydrogenated oils, or high amounts of saturated fat. Avoid foods that contain saturated fat and trans fat. ? The amount of cholesterol in each serving. Try to eat no more than 200 mg of cholesterol each day. ? The amount of fiber in each serving. Try to eat at least 20-30 g of fiber each day.  Choose foods with healthy fats, such as: ? Monounsaturated and polyunsaturated fats. These include olive and canola oil, flaxseeds, walnuts, almonds, and seeds. ? Omega-3 fats. These are found in foods such as salmon, mackerel, sardines, tuna, flaxseed oil, and ground flaxseeds.  Choose grain products that have whole grains. Look for the word "whole" as the first word in the ingredient list. Cooking  Cook foods using methods other than frying. Baking, boiling, grilling, and broiling are some healthy options.  Eat more home-cooked food  and less restaurant, buffet, and fast food.  Avoid cooking using saturated fats. ? Animal sources of saturated fats include meats, butter, and cream. ? Plant sources of saturated fats include palm oil, palm kernel oil, and coconut oil.  Meal planning   At meals, imagine dividing your plate into fourths: ? Fill one-half of your plate with vegetables and green salads. ? Fill one-fourth of your plate with whole grains. ? Fill one-fourth of your plate with lean protein foods.  Eat fish that is high in omega-3 fats at least two times a week.  Eat more foods that contain fiber, such as whole grains, beans, apples, broccoli, carrots, peas, and barley. These foods help promote healthy cholesterol levels in the blood. Recommended foods Grains  Whole grains, such as whole wheat or whole grain breads, crackers, cereals, and pasta. Unsweetened oatmeal, bulgur, barley, quinoa, or brown rice. Corn or whole wheat flour tortillas. Vegetables  Fresh or frozen vegetables (raw, steamed, roasted, or grilled). Green salads. Fruits  All fresh, canned (in natural juice), or frozen fruits. Meats and other protein foods  Ground beef (85% or leaner), grass-fed beef, or beef trimmed of fat. Skinless chicken or [place]. Ground chicken or [place]. Pork trimmed of fat. All fish and seafood. Egg whites. Dried beans, peas, or lentils. Unsalted nuts or seeds. Unsalted canned beans. Natural nut butters without added sugar and oil. Dairy  Low-fat or nonfat dairy products, such as skim or 1% milk, 2% or reduced-fat cheeses, low-fat and fat-free ricotta or cottage cheese, or plain low-fat and nonfat yogurt. Fats and oils  Tub margarine without trans fats. Light or reduced-fat mayonnaise and salad dressings. Avocado. Olive, canola, sesame, or safflower oils. The items listed above may not be a complete list of recommended foods or beverages. Contact your dietitian for more options. Foods to avoid Grains  White bread.  White pasta. White rice. Cornbread. Bagels, pastries, and croissants. Crackers and snack foods that contain trans fat and hydrogenated oils. Vegetables  Vegetables cooked in cheese, cream, or butter sauce. Fried vegetables. Fruits  Canned fruit in heavy syrup. Fruit in cream or butter sauce. Fried fruit. Meats and other protein foods  Fatty cuts of meat. Ribs, chicken wings, bacon, sausage, bologna, salami, chitterlings, fatback, hot dogs, bratwurst, and packaged lunch meats. Liver and organ meats. Whole eggs and egg yolks. Chicken and [place] with skin. Fried meat. Dairy  Whole or 2% milk, cream, half-and-half, and cream cheese. Whole milk cheeses. Whole-fat or sweetened yogurt. Full-fat cheeses. Nondairy creamers and whipped toppings. Processed cheese, cheese spreads, and cheese curds. Beverages  Alcohol. Sugar-sweetened drinks such as sodas, lemonade, and fruit drinks. Fats and oils  Butter, stick margarine, lard, shortening, ghee, or bacon fat. Coconut, palm kernel, and palm oils. Sweets and desserts  Corn syrup, sugars, honey, and molasses. Candy. Jam and jelly. Syrup. Sweetened cereals. Cookies, pies, cakes, donuts, muffins, and ice cream. The items listed above may not be a complete list of foods and beverages to avoid. Contact your dietitian for more information. Summary  Your body needs fat and cholesterol for basic functions. However, eating too much of these things can be harmful to your health.  Work with your health care provider and dietitian to follow a diet low in fat and cholesterol. Doing this may help lower your risk for heart disease and other conditions.  Choose healthy fats, such as monounsaturated and polyunsaturated fats, and foods high in omega-3 fatty acids.  Eat fiber-rich foods, such as whole grains, beans, peas, fruits, and vegetables.  Limit or avoid alcohol, fried foods, and foods high in saturated fats, partially hydrogenated oils, and sugar. This  information is not intended to replace advice given to you by your health care provider. Make sure  you discuss any questions you have with your health care provider. Document Released: 08/17/2005 Document Revised: 07/30/2017 Document Reviewed: 05/04/2017 Elsevier Patient Education  2020 [employer].

## 2019-04-21 NOTE — Addendum Note (Signed)
Addended by: Jon Billings on: 04/21/2019 09:19 AM   Modules accepted: Miquel Dunn

## 2019-05-11 ENCOUNTER — Encounter: Payer: Self-pay | Admitting: Gastroenterology

## 2019-05-11 ENCOUNTER — Ambulatory Visit (INDEPENDENT_AMBULATORY_CARE_PROVIDER_SITE_OTHER): Payer: 59 | Admitting: *Deleted

## 2019-05-11 ENCOUNTER — Other Ambulatory Visit: Payer: Self-pay

## 2019-05-11 VITALS — Temp 98.2°F | Ht 66.0 in | Wt 190.4 lb

## 2019-05-11 DIAGNOSIS — Z1211 Encounter for screening for malignant neoplasm of colon: Secondary | ICD-10-CM

## 2019-05-11 MED ORDER — NA SULFATE-K SULFATE-MG SULF 17.5-3.13-1.6 GM/177ML PO SOLN
1.0000 | Freq: Once | ORAL | 0 refills | Status: AC
Start: 2019-05-11 — End: 2019-05-11

## 2019-05-11 NOTE — Progress Notes (Signed)
No egg or soy allergy known to patient  No issues with past sedation with any surgeries  or procedures, no intubation problems  No diet pills per patient No home 02 use per patient  No blood thinners per patient  Pt denies issues with constipation  No A fib or A flutter  EMMI video sent to pt's e mail  

## 2019-05-24 ENCOUNTER — Telehealth: Payer: Self-pay

## 2019-05-24 NOTE — Telephone Encounter (Signed)
Covid-19 screening questions   Do you now or have you had a fever in the last 14 days? NO   Do you have any respiratory symptoms of shortness of breath or cough now or in the last 14 days? NO  Do you have any family members or close contacts with diagnosed or suspected Covid-19 in the past 14 days? NO  Have you been tested for Covid-19 and found to be positive? NO        

## 2019-05-25 ENCOUNTER — Ambulatory Visit (AMBULATORY_SURGERY_CENTER): Payer: 59 | Admitting: Internal Medicine

## 2019-05-25 ENCOUNTER — Encounter: Payer: 59 | Admitting: Gastroenterology

## 2019-05-25 ENCOUNTER — Encounter: Payer: Self-pay | Admitting: Internal Medicine

## 2019-05-25 ENCOUNTER — Other Ambulatory Visit: Payer: Self-pay

## 2019-05-25 VITALS — BP 116/78 | HR 57 | Temp 98.2°F | Resp 15 | Ht 66.0 in | Wt 190.0 lb

## 2019-05-25 DIAGNOSIS — D122 Benign neoplasm of ascending colon: Secondary | ICD-10-CM

## 2019-05-25 DIAGNOSIS — D128 Benign neoplasm of rectum: Secondary | ICD-10-CM

## 2019-05-25 DIAGNOSIS — Z1211 Encounter for screening for malignant neoplasm of colon: Secondary | ICD-10-CM | POA: Diagnosis not present

## 2019-05-25 DIAGNOSIS — D123 Benign neoplasm of transverse colon: Secondary | ICD-10-CM

## 2019-05-25 DIAGNOSIS — K621 Rectal polyp: Secondary | ICD-10-CM

## 2019-05-25 MED ORDER — SODIUM CHLORIDE 0.9 % IV SOLN: 500.0000 mL | Freq: Once | INTRAVENOUS | Status: DC

## 2019-05-25 NOTE — Progress Notes (Signed)
Report to PACU, RN, vss, BBS= Clear.  

## 2019-05-25 NOTE — Progress Notes (Signed)
Called to room to assist during endoscopic procedure.  Patient ID and intended procedure confirmed with present staff. Received instructions for my participation in the procedure from the performing physician.  

## 2019-05-25 NOTE — Op Note (Signed)
[facility] Patient Name: Jared Park Procedure Date: 05/25/2019 2:49 PM MRN: [id number] Endoscopist: Docia Chuck. Henrene Pastor , MD Age: 53 Referring MD:  Date of Birth: September 11, 1965 Gender: Male Account #: [id number] Procedure:                Colonoscopy with cold snare polypectomy x 3 Indications:              Screening for colorectal malignant neoplasm Medicines:                Monitored Anesthesia Care Procedure:                Pre-Anesthesia Assessment:                           - Prior to the procedure, a History and Physical                            was performed, and patient medications and                            allergies were reviewed. The patient's tolerance of                            previous anesthesia was also reviewed. The risks                            and benefits of the procedure and the sedation                            options and risks were discussed with the patient.                            All questions were answered, and informed consent                            was obtained. Prior Anticoagulants: The patient has                            taken no previous anticoagulant or antiplatelet                            agents. ASA Grade Assessment: I - A normal, healthy                            patient. After reviewing the risks and benefits,                            the patient was deemed in satisfactory condition to                            undergo the procedure.                           After obtaining informed consent, the colonoscope  was passed under direct vision. Throughout the                            procedure, the patient's blood pressure, pulse, and                            oxygen saturations were monitored continuously. The                            Colonoscope was introduced through the anus and                            advanced to the the cecum, identified by                            appendiceal  orifice and ileocecal valve. The                            ileocecal valve, appendiceal orifice, and rectum                            were photographed. The quality of the bowel                            preparation was excellent. The colonoscopy was                            performed without difficulty. The patient tolerated                            the procedure well. The bowel preparation used was                            SUPREP via split dose instruction. Scope In: 3:06:29 PM Scope Out: 3:25:36 PM Scope Withdrawal Time: 0 hours 15 minutes 29 seconds  Total Procedure Duration: 0 hours 19 minutes 7 seconds  Findings:                 Three polyps were found in the rectum, transverse                            colon and ascending colon. The polyps were 1 to 3                            mm in size. These polyps were removed with a cold                            snare. Resection and retrieval were complete.                           A few small-mouthed diverticula were found in the                            entire colon.  The exam was otherwise without abnormality on                            direct and retroflexion views. Complications:            No immediate complications. Estimated blood loss:                            None. Estimated Blood Loss:     Estimated blood loss: none. Impression:               - Three 1 to 3 mm polyps in the rectum, in the                            transverse colon and in the ascending colon,                            removed with a cold snare. Resected and retrieved.                           - Diverticulosis in the entire examined colon.                           - The examination was otherwise normal on direct                            and retroflexion views. Recommendation:           - Repeat colonoscopy in [place] years for                            surveillance.                           - Patient has a contact  number available for                            emergencies. The signs and symptoms of potential                            delayed complications were discussed with the                            patient. Return to normal activities tomorrow.                            Written discharge instructions were provided to the                            patient.                           - Resume previous diet.                           - Continue present medications.                           -  Await pathology results. Docia Chuck. Henrene Pastor, MD 05/25/2019 3:38:40 PM This report has been signed electronically.

## 2019-05-25 NOTE — Patient Instructions (Signed)
Handouts given for polyps and diverticulosis.  YOU HAD AN ENDOSCOPIC PROCEDURE TODAY AT THE [facility]:   Refer to the procedure report that was given to you for any specific questions about what was found during the examination.  If the procedure report does not answer your questions, please call your gastroenterologist to clarify.  If you requested that your care partner not be given the details of your procedure findings, then the procedure report has been included in a sealed envelope for you to review at your convenience later.  YOU SHOULD EXPECT: Some feelings of bloating in the abdomen. Passage of more gas than usual.  Walking can help get rid of the air that was put into your GI tract during the procedure and reduce the bloating. If you had a lower endoscopy (such as a colonoscopy or flexible sigmoidoscopy) you may notice spotting of blood in your stool or on the toilet paper. If you underwent a bowel prep for your procedure, you may not have a normal bowel movement for a few days.  Please Note:  You might notice some irritation and congestion in your nose or some drainage.  This is from the oxygen used during your procedure.  There is no need for concern and it should clear up in a day or so.  SYMPTOMS TO REPORT IMMEDIATELY:   Following lower endoscopy (colonoscopy or flexible sigmoidoscopy):  Excessive amounts of blood in the stool  Significant tenderness or worsening of abdominal pains  Swelling of the abdomen that is new, acute  Fever of 100F or higher  For urgent or emergent issues, a gastroenterologist can be reached at any hour by calling [phone].   DIET:  We do recommend a small meal at first, but then you may proceed to your regular diet.  Drink plenty of fluids but you should avoid alcoholic beverages for 24 hours.  ACTIVITY:  You should plan to take it easy for the rest of today and you should NOT DRIVE or use heavy machinery until tomorrow (because of  the sedation medicines used during the test).    FOLLOW UP: Our staff will call the number listed on your records 48-72 hours following your procedure to check on you and address any questions or concerns that you may have regarding the information given to you following your procedure. If we do not reach you, we will leave a message.  We will attempt to reach you two times.  During this call, we will ask if you have developed any symptoms of COVID 19. If you develop any symptoms (ie: fever, flu-like symptoms, shortness of breath, cough etc.) before then, please call [phone].  If you test positive for Covid 19 in the 2 weeks post procedure, please call and report this information to us.    If any biopsies were taken you will be contacted by phone or by letter within the next 1-3 weeks.  Please call us at [phone] if you have not heard about the biopsies in 3 weeks.    SIGNATURES/CONFIDENTIALITY: You and/or your care partner have signed paperwork which will be entered into your electronic medical record.  These signatures attest to the fact that that the information above on your After Visit Summary has been reviewed and is understood.  Full responsibility of the confidentiality of this discharge information lies with you and/or your care-partner. 

## 2019-05-25 NOTE — Progress Notes (Signed)
Pt's states no medical or surgical changes since previsit or office visit.  Covid- [place]

## 2019-05-29 ENCOUNTER — Telehealth: Payer: Self-pay

## 2019-05-29 NOTE — Telephone Encounter (Signed)
  Follow up Call-  Call back number 05/25/2019  Post procedure Call Back phone  # [phone]  Permission to leave phone message Yes  Some recent data might be hidden     Patient questions:  Do you have a fever, pain , or abdominal swelling? No. Pain Score  0 *  Have you tolerated food without any problems? Yes.    Have you been able to return to your normal activities? Yes.    Do you have any questions about your discharge instructions: Diet   No. Medications  No. Follow up visit  No.  Do you have questions or concerns about your Care? No.  Actions: * If pain score is 4 or above: No action needed, pain <4.  1. Have you developed a fever since your procedure? no  2.   Have you had an respiratory symptoms (SOB or cough) since your procedure? no  3.   Have you tested positive for COVID 19 since your procedure no  4.   Have you had any family members/close contacts diagnosed with the COVID 19 since your procedure?  no   If yes to any of these questions please route to Joylene John, RN and Alphonsa Gin, Therapist, sports.

## 2019-05-30 ENCOUNTER — Encounter: Payer: Self-pay | Admitting: Internal Medicine

## 2019-06-29 DIAGNOSIS — S93402A Sprain of unspecified ligament of left ankle, initial encounter: Secondary | ICD-10-CM | POA: Diagnosis not present

## 2019-10-18 DIAGNOSIS — M25461 Effusion, right knee: Secondary | ICD-10-CM | POA: Diagnosis not present

## 2019-10-18 DIAGNOSIS — M25561 Pain in right knee: Secondary | ICD-10-CM | POA: Diagnosis not present

## 2020-02-15 ENCOUNTER — Ambulatory Visit: Payer: 59 | Admitting: Family Medicine

## 2020-03-11 ENCOUNTER — Other Ambulatory Visit: Payer: Self-pay

## 2020-03-12 ENCOUNTER — Ambulatory Visit (INDEPENDENT_AMBULATORY_CARE_PROVIDER_SITE_OTHER): Payer: 59

## 2020-03-12 ENCOUNTER — Ambulatory Visit (INDEPENDENT_AMBULATORY_CARE_PROVIDER_SITE_OTHER): Payer: 59 | Admitting: Family Medicine

## 2020-03-12 ENCOUNTER — Other Ambulatory Visit: Payer: Self-pay

## 2020-03-12 ENCOUNTER — Encounter: Payer: Self-pay | Admitting: Family Medicine

## 2020-03-12 VITALS — BP 122/76 | HR 60 | Temp 97.0°F | Ht 66.0 in | Wt 187.4 lb

## 2020-03-12 DIAGNOSIS — S161XXA Strain of muscle, fascia and tendon at neck level, initial encounter: Secondary | ICD-10-CM | POA: Insufficient documentation

## 2020-03-12 DIAGNOSIS — E7800 Pure hypercholesterolemia, unspecified: Secondary | ICD-10-CM | POA: Diagnosis not present

## 2020-03-12 LAB — LDL CHOLESTEROL, DIRECT: Direct LDL: 132 mg/dL

## 2020-03-12 IMAGING — DX DG CERVICAL SPINE COMPLETE 4+V
5 series · 5 of 5 positions shown · non-contrast
Comparison: Remote radiograph [DATE]

CLINICAL DATA: Cervical neck pain with spasm. Neck pain for 1
month. No known injury.

EXAM:
CERVICAL SPINE - COMPLETE 4+ VIEW

[cervical spine ap]
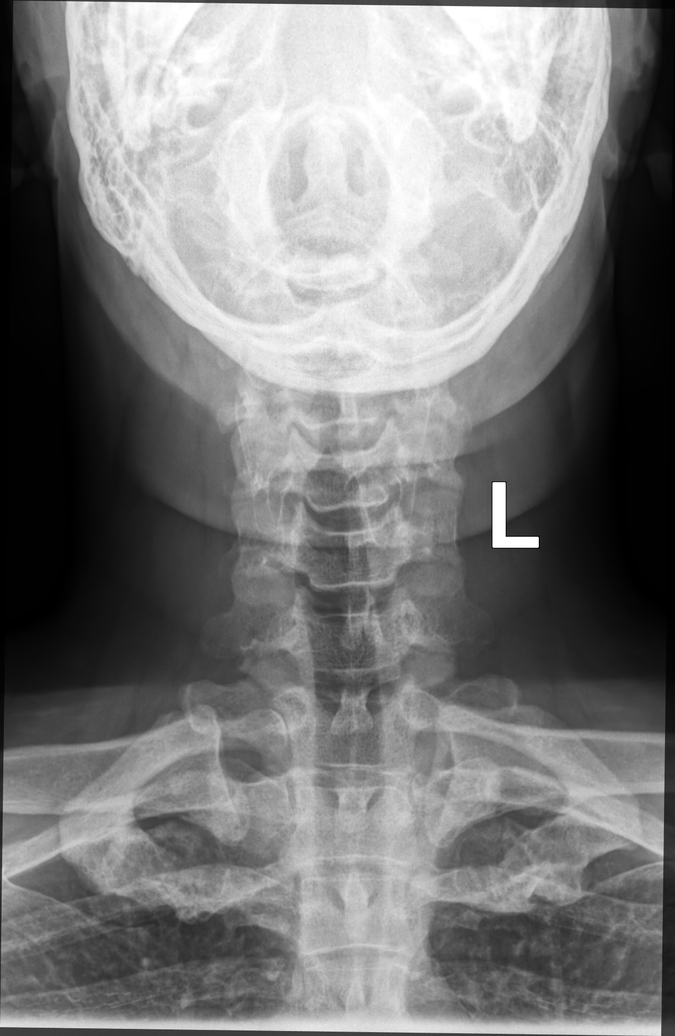

[cervical spine oblique (1 of 2)]
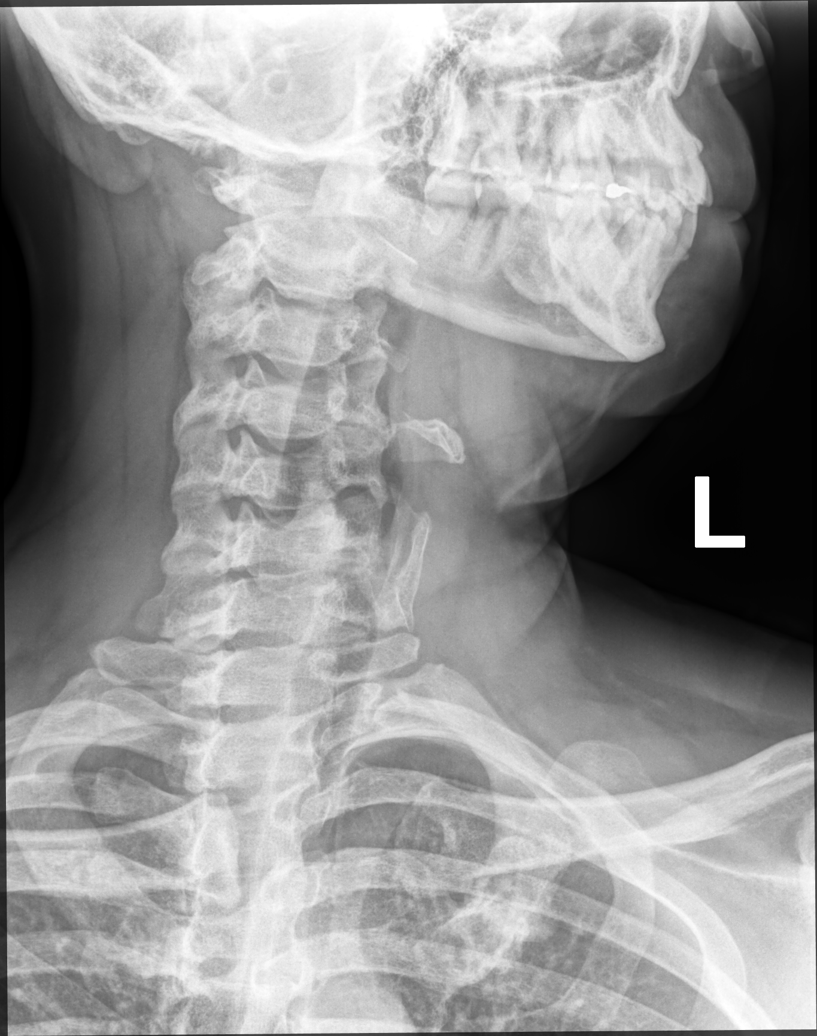

[cervical spine oblique (2 of 2)]
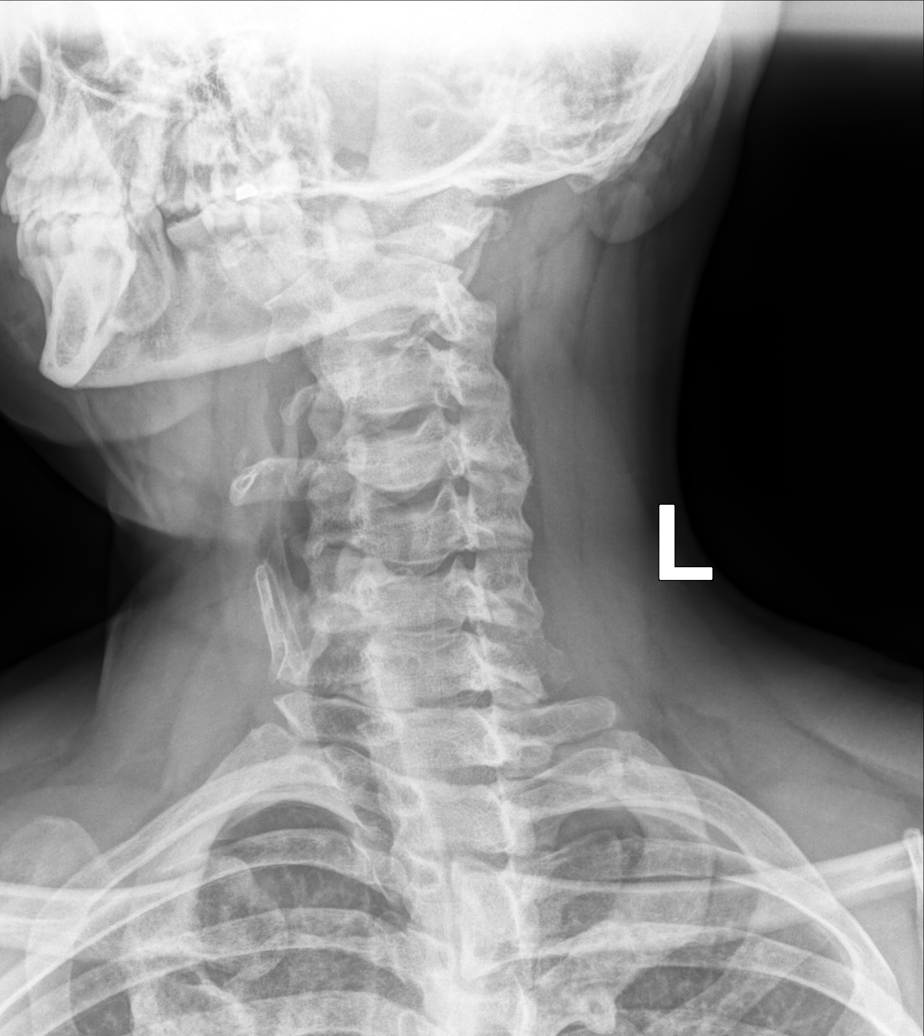

[cervical spine lat]
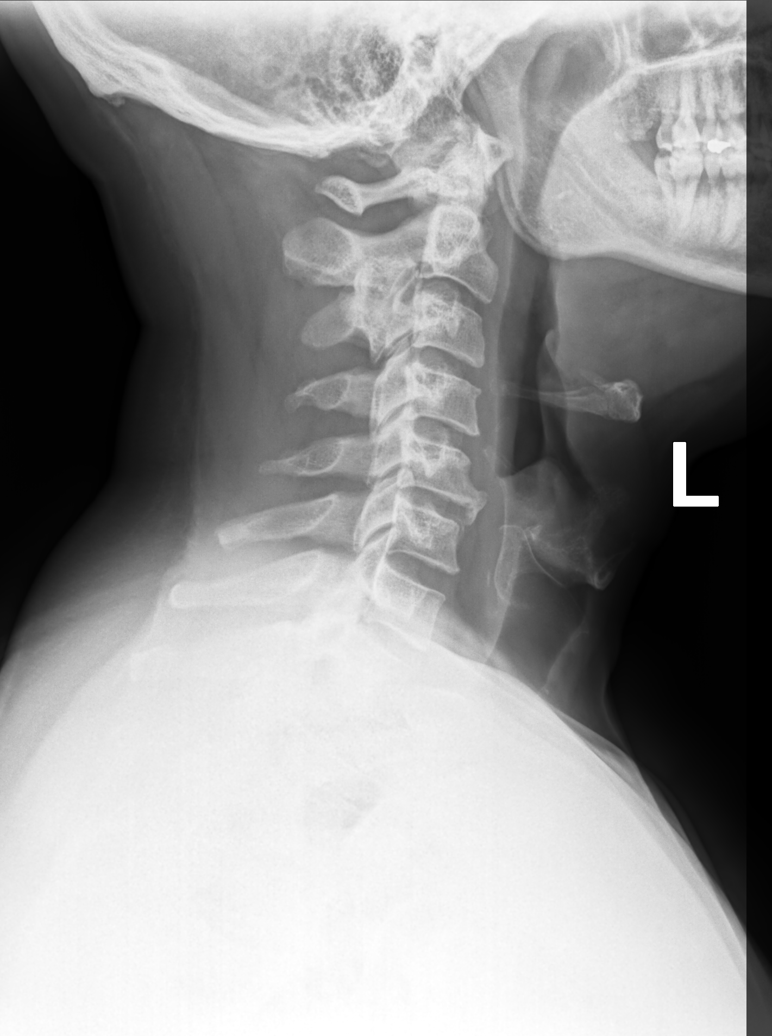

[cervical spine open mouth ap]
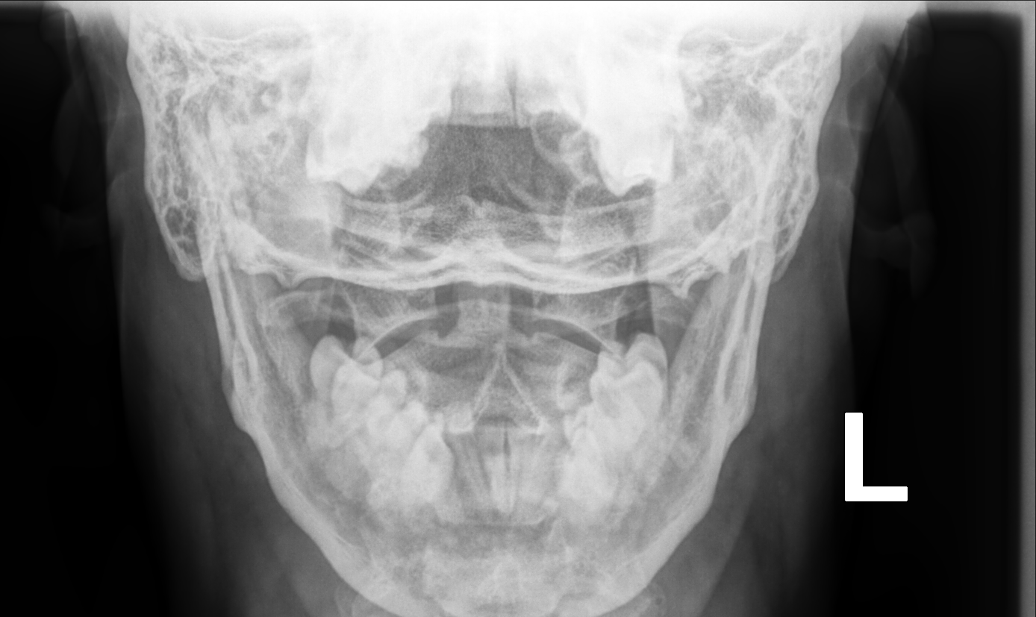

[5 of 5 positions shown; findings below may reference images not displayed]

FINDINGS: No listhesis. Slight straightening of normal lordosis which is
similar to prior exam. Vertebral body heights are preserved. Disc
spaces are preserved, there is endplate spurring at C5-C6 and to a
lesser extent additional levels. Lateral masses of C1 well aligned
on C2. Limited neural foraminal assessment due to positioning.
Suspect mild scattered facet hypertrophy. No prevertebral soft
tissue edema. No evidence of focal bone lesion.
IMPRESSION: 1. Degenerative endplate spurring at C5-C6 and to a lesser extent
additional levels. Scattered facet hypertrophy.
2. Slight straightening of normal lordosis, may be muscle spasm or
positioning.

## 2020-03-12 MED ORDER — METHOCARBAMOL 500 MG PO TABS: 500.0000 mg | ORAL_TABLET | Freq: Three times a day (TID) | ORAL | 0 refills | Status: DC | PRN

## 2020-03-12 MED ORDER — MELOXICAM 7.5 MG PO TABS: 7.5000 mg | ORAL_TABLET | Freq: Every day | ORAL | 0 refills | Status: DC

## 2020-03-12 NOTE — Progress Notes (Signed)
Established Patient Office Visit  Subjective:  Patient ID: Jared Louvier., male    DOB: 06-07-1966  Age: 54 y.o. MRN: [id number]  CC:  Chief Complaint  Patient presents with  . Pain    c/o neck pain x 1 month pt states that he can not sleep due to the pain.     HPI Jared S Ceballos Jr. presents for for neck pain.  This is been going on for about a month now.  Denies injury fevers or prior history of neck problems.  Denies stress.  Has bought new pillows to try to help.  There is pain with range of motion to the left and to the right.  Pain seems to be in the muscles in the back of the neck on either side.  Denies any weakness or paresthesias in his upper extremities.  Past Medical History:  Diagnosis Date  . Hypertension    Was told "pre-hypertension" - no meds needed    Past Surgical History:  Procedure Laterality Date  . WISDOM TOOTH EXTRACTION     age 75    Family History  Problem Relation Age of Onset  . Diabetes Mother   . Hyperlipidemia Mother   . Hypertension Mother   . Stroke Father   . Healthy Sister   . Healthy Brother   . Colon polyps Neg Hx   . Colon cancer Neg Hx   . Esophageal cancer Neg Hx   . Rectal cancer Neg Hx   . Stomach cancer Neg Hx     Social History   Socioeconomic History  . Marital status: Married    Spouse name: Not on file  . Number of children: Not on file  . Years of education: Not on file  . Highest education level: Not on file  Occupational History  . Not on file  Tobacco Use  . Smoking status: Never Smoker  . Smokeless tobacco: Never Used  Substance and Sexual Activity  . Alcohol use: Yes    Comment: 2-3 beers 4-5 days weekly  . Drug use: No  . Sexual activity: Yes  Other Topics Concern  . Not on file  Social History Narrative  . Not on file   Social Determinants of Health   Financial Resource Strain:   . Difficulty of Paying Living Expenses:   Food Insecurity:   . Worried About Charity fundraiser in the Last  Year:   . Arboriculturist in the Last Year:   Transportation Needs:   . Film/video editor (Medical):   Marland Kitchen Lack of Transportation (Non-Medical):   Physical Activity:   . Days of Exercise per Week:   . Minutes of Exercise per Session:   Stress:   . Feeling of Stress :   Social Connections:   . Frequency of Communication with Friends and Family:   . Frequency of Social Gatherings with Friends and Family:   . Attends Religious Services:   . Active Member of Clubs or Organizations:   . Attends Archivist Meetings:   Marland Kitchen Marital Status:   Intimate Partner Violence:   . Fear of Current or Ex-Partner:   . Emotionally Abused:   Marland Kitchen Physically Abused:   . Sexually Abused:     No outpatient medications prior to visit.   No facility-administered medications prior to visit.    No Known Allergies  ROS Review of Systems  Constitutional: Negative.   HENT: Negative.   Eyes: Negative for photophobia and  visual disturbance.  Respiratory: Negative.   Cardiovascular: Negative.   Gastrointestinal: Negative.   Musculoskeletal: Positive for myalgias, neck pain and neck stiffness.  Neurological: Negative for weakness and numbness.  Psychiatric/Behavioral: Negative.       Objective:    Physical Exam Vitals and nursing note reviewed.  Constitutional:      General: He is not in acute distress.    Appearance: Normal appearance. He is not ill-appearing, toxic-appearing or diaphoretic.  HENT:     Head: Normocephalic and atraumatic.     Right Ear: External ear normal.     Left Ear: External ear normal.  Eyes:     General: No scleral icterus.       Right eye: No discharge.        Left eye: No discharge.     Conjunctiva/sclera: Conjunctivae normal.  Pulmonary:     Effort: Pulmonary effort is normal.  Musculoskeletal:     Right shoulder: Normal.     Left shoulder: Normal.     Cervical back: No swelling, deformity, signs of trauma, spasms, tenderness or bony tenderness. Pain  with movement present. Decreased range of motion (near full range of motion with discomfort).  Neurological:     Mental Status: He is alert.     Motor: No weakness or atrophy.     Deep Tendon Reflexes:     Reflex Scores:      Tricep reflexes are 1+ on the right side and 1+ on the left side.      Bicep reflexes are 1+ on the right side and 1+ on the left side.      Brachioradialis reflexes are 1+ on the right side and 1+ on the left side.    BP 122/76   Pulse 60   Temp (!) 97 F (36.1 C) (Tympanic)   Ht 5\' 6"  (1.676 m)   Wt 187 lb 6.4 oz (85 kg)   SpO2 96%   BMI 30.25 kg/m  Wt Readings from Last 3 Encounters:  03/12/20 187 lb 6.4 oz (85 kg)  05/25/19 190 lb (86.2 kg)  05/11/19 190 lb 6.4 oz (86.4 kg)     Health Maintenance Due  Topic Date Due  . Hepatitis C Screening  Never done  . COVID-19 Vaccine (1) Never done    There are no preventive care reminders to display for this patient.  Lab Results  Component Value Date   TSH 1.450 02/23/2018   Lab Results  Component Value Date   WBC 4.1 04/20/2019   HGB 13.9 04/20/2019   HCT 43.7 04/20/2019   MCV 88.4 04/20/2019   PLT 309.0 04/20/2019   Lab Results  Component Value Date   NA 140 04/20/2019   K 4.2 04/20/2019   CO2 26 04/20/2019   GLUCOSE 103 (H) 04/20/2019   BUN 14 04/20/2019   CREATININE 1.13 04/20/2019   BILITOT 0.7 04/20/2019   ALKPHOS 60 04/20/2019   AST 14 04/20/2019   ALT 21 04/20/2019   PROT 7.0 04/20/2019   ALBUMIN 4.3 04/20/2019   CALCIUM 9.1 04/20/2019   GFR 82.83 04/20/2019   Lab Results  Component Value Date   CHOL 251 (H) 04/20/2019   Lab Results  Component Value Date   HDL 50.50 04/20/2019   Lab Results  Component Value Date   LDLCALC 170 (H) 04/20/2019   Lab Results  Component Value Date   TRIG 154.0 (H) 04/20/2019   Lab Results  Component Value Date   CHOLHDL 5 04/20/2019  No results found for: HGBA1C    Assessment & Plan:   Problem List Items Addressed This Visit       Musculoskeletal and Integument   Cervical strain   Relevant Medications   meloxicam (MOBIC) 7.5 MG tablet   methocarbamol (ROBAXIN) 500 MG tablet   Other Relevant Orders   DG Cervical Spine Complete     Other   Elevated LDL cholesterol level - Primary   Relevant Orders   LDL cholesterol, direct      Meds ordered this encounter  Medications  . meloxicam (MOBIC) 7.5 MG tablet    Sig: Take 1 tablet (7.5 mg total) by mouth daily.    Dispense:  30 tablet    Refill:  0  . methocarbamol (ROBAXIN) 500 MG tablet    Sig: Take 1 tablet (500 mg total) by mouth every 8 (eight) hours as needed for muscle spasms.    Dispense:  40 tablet    Refill:  0    Follow-up: Return in about 3 months (around 06/12/2020), or if symptoms worsen or fail to improve, for Consider Sports med referral if not improving in a few weeks on given therapy. .  Given information also on managing elevated cholesterol.     [name], MD

## 2020-03-12 NOTE — Patient Instructions (Signed)
Preventing High Cholesterol Cholesterol is a white, waxy substance similar to fat that the human body needs to help build cells. The liver makes all the cholesterol that a person's body needs. Having high cholesterol (hypercholesterolemia) increases a person's risk for heart disease and stroke. Extra (excess) cholesterol comes from the food the person eats. High cholesterol can often be prevented with diet and lifestyle changes. If you already have high cholesterol, you can control it with diet and lifestyle changes and with medicine. How can high cholesterol affect me? If you have high cholesterol, deposits (plaques) may build up on the walls of your arteries. The arteries are the blood vessels that carry blood away from your heart. Plaques make the arteries narrower and stiffer. This can limit or block blood flow and cause blood clots to form. Blood clots:  Are tiny balls of cells that form in your blood.  Can move to the heart or brain, causing a heart attack or stroke. Plaques in arteries greatly increase your risk for heart attack and stroke.Making diet and lifestyle changes can reduce your risk for these conditions that may threaten your life. What can increase my risk? This condition is more likely to develop in people who:  Eat foods that are high in saturated fat or cholesterol. Saturated fat is mostly found in: ? Foods that contain animal fat, such as red meat and some dairy products. ? Certain fatty foods made from plants, such as tropical oils.  Are overweight.  Are not getting enough exercise.  Have a family history of high cholesterol. What actions can I take to prevent this? Nutrition   Eat less saturated fat.  Avoid trans fats (partially hydrogenated oils). These are often found in margarine and in some baked goods, fried foods, and snacks bought in packages.  Avoid precooked or cured meat, such as sausages or meat loaves.  Avoid foods and drinks that have added  sugars.  Eat more fruits, vegetables, and whole grains.  Choose healthy sources of protein, such as fish, poultry, lean cuts of red meat, beans, peas, lentils, and nuts.  Choose healthy sources of fat, such as: ? Nuts. ? Vegetable oils, especially olive oil. ? Fish that have healthy fats (omega-3 fatty acids), such as mackerel or salmon. The items listed above may not be a complete list of recommended foods and beverages. Contact a dietitian for more information. Lifestyle  Lose weight if you are overweight. Losing 5-10 lb (2.3-4.5 kg) can help prevent or control high cholesterol. It can also lower your risk for diabetes and high blood pressure. Ask your health care provider to help you with a diet and exercise plan to lose weight safely.  Do not use any products that contain nicotine or tobacco, such as cigarettes, e-cigarettes, and chewing tobacco. If you need help quitting, ask your health care provider.  Limit your alcohol intake. ? Do not drink alcohol if:  Your health care provider tells you not to drink.  You are pregnant, may be pregnant, or are planning to become pregnant. ? If you drink alcohol:  Limit how much you use to:  0-1 drink a day for women.  0-2 drinks a day for men.  Be aware of how much alcohol is in your drink. In the [place] one drink equals one 12 oz bottle of beer (355 mL), one 5 oz glass of wine (148 mL), or one 1 oz glass of hard liquor (44 mL). Activity   Get enough exercise. Each week, do at   least 150 minutes of exercise that takes a medium level of effort (moderate-intensity exercise). ? This is exercise that:  Makes your heart beat faster and makes you breathe harder than usual.  Allows you to still be able to talk. ? You could exercise in short sessions several times a day or longer sessions a few times a week. For example, on 5 days each week, you could walk fast or ride your bike 3 times a day for 10 minutes each time.  Do exercises as told  by your health care provider. Medicines  In addition to diet and lifestyle changes, your health care provider may recommend medicines to help lower cholesterol. This may be a medicine to lower the amount of cholesterol your liver makes. You may need medicine if: ? Diet and lifestyle changes do not lower your cholesterol enough. ? You have high cholesterol and other risk factors for heart disease or stroke.  Take over-the-counter and prescription medicines only as told by your health care provider. General information  Manage your risk factors for high cholesterol. Talk with your health care provider about all your risk factors and how to lower your risk.  Manage other conditions that you have, such as diabetes or high blood pressure (hypertension).  Have blood tests to check your cholesterol levels at regular points in time as told by your health care provider.  Keep all follow-up visits as told by your health care provider. This is important. Where to find more information  American Heart Association: www.heart.org  National Heart, Lung, and Blood Institute: https://wilson-eaton.com/ Summary  High cholesterol increases your risk for heart disease and stroke. By keeping your cholesterol level low, you can reduce your risk for these conditions.  High cholesterol can often be prevented with diet and lifestyle changes.  Work with your health care provider to manage your risk factors, and have your blood tested regularly. This information is not intended to replace advice given to you by your health care provider. Make sure you discuss any questions you have with your health care provider. Document Revised: 12/09/2018 Document Reviewed: 04/25/2016 Elsevier Patient Education  [place].  Cervical Strain and Sprain Rehab Ask your health care provider which exercises are safe for you. Do exercises exactly as told by your health care provider and adjust them as directed. It is normal to  feel mild stretching, pulling, tightness, or discomfort as you do these exercises. Stop right away if you feel sudden pain or your pain gets worse. Do not begin these exercises until told by your health care provider. Stretching and range-of-motion exercises Cervical side bending  1. Using good posture, sit on a stable chair or stand up. 2. Without moving your shoulders, slowly tilt your left / right ear to your shoulder until you feel a stretch in the opposite side neck muscles. You should be looking straight ahead. 3. Hold for __________ seconds. 4. Repeat with the other side of your neck. Repeat __________ times. Complete this exercise __________ times a day. Cervical rotation  1. Using good posture, sit on a stable chair or stand up. 2. Slowly turn your head to the side as if you are looking over your left / right shoulder. ? Keep your eyes level with the ground. ? Stop when you feel a stretch along the side and the back of your neck. 3. Hold for __________ seconds. 4. Repeat this by turning to your other side. Repeat __________ times. Complete this exercise __________ times a day. Thoracic  extension and pectoral stretch 1. Roll a towel or a small blanket so it is about 4 inches (10 cm) in diameter. 2. Lie down on your back on a firm surface. 3. Put the towel lengthwise, under your spine in the middle of your back. It should not be under your shoulder blades. The towel should line up with your spine from your middle back to your lower back. 4. Put your hands behind your head and let your elbows fall out to your sides. 5. Hold for __________ seconds. Repeat __________ times. Complete this exercise __________ times a day. Strengthening exercises Isometric upper cervical flexion 1. Lie on your back with a thin pillow behind your head and a small rolled-up towel under your neck. 2. Gently tuck your chin toward your chest and nod your head down to look toward your feet. Do not lift your  head off the pillow. 3. Hold for __________ seconds. 4. Release the tension slowly. Relax your neck muscles completely before you repeat this exercise. Repeat __________ times. Complete this exercise __________ times a day. Isometric cervical extension  1. Stand about 6 inches (15 cm) away from a wall, with your back facing the wall. 2. Place a soft object, about 6-8 inches (15-20 cm) in diameter, between the back of your head and the wall. A soft object could be a small pillow, a ball, or a folded towel. 3. Gently tilt your head back and press into the soft object. Keep your jaw and forehead relaxed. 4. Hold for __________ seconds. 5. Release the tension slowly. Relax your neck muscles completely before you repeat this exercise. Repeat __________ times. Complete this exercise __________ times a day. Posture and body mechanics Body mechanics refers to the movements and positions of your body while you do your daily activities. Posture is part of body mechanics. Good posture and healthy body mechanics can help to relieve stress in your body's tissues and joints. Good posture means that your spine is in its natural S-curve position (your spine is neutral), your shoulders are pulled back slightly, and your head is not tipped forward. The following are general guidelines for applying improved posture and body mechanics to your everyday activities. Sitting  1. When sitting, keep your spine neutral and keep your feet flat on the floor. Use a footrest, if necessary, and keep your thighs parallel to the floor. Avoid rounding your shoulders, and avoid tilting your head forward. 2. When working at a desk or a computer, keep your desk at a height where your hands are slightly lower than your elbows. Slide your chair under your desk so you are close enough to maintain good posture. 3. When working at a computer, place your monitor at a height where you are looking straight ahead and you do not have to tilt  your head forward or downward to look at the screen. Standing   When standing, keep your spine neutral and keep your feet about hip-width apart. Keep a slight bend in your knees. Your ears, shoulders, and hips should line up.  When you do a task in which you stand in one place for a long time, place one foot up on a stable object that is 2-4 inches (5-10 cm) high, such as a footstool. This helps keep your spine neutral. Resting When lying down and resting, avoid positions that are most painful for you. Try to support your neck in a neutral position. You can use a contour pillow or a small rolled-up towel. Your  pillow should support your neck but not push on it. This information is not intended to replace advice given to you by your health care provider. Make sure you discuss any questions you have with your health care provider. Document Revised: 12/07/2018 Document Reviewed: 05/18/2018 Elsevier Patient Education  [place].

## 2020-03-31 DIAGNOSIS — U071 COVID-19: Secondary | ICD-10-CM

## 2020-03-31 HISTORY — DX: COVID-19: U07.1

## 2020-04-20 ENCOUNTER — Encounter (HOSPITAL_COMMUNITY): Payer: Self-pay

## 2020-04-20 ENCOUNTER — Emergency Department (HOSPITAL_COMMUNITY)
Admission: EM | Admit: 2020-04-20 | Discharge: 2020-04-20 | Disposition: A | Discharge status: Home / Self Care | Payer: 59 | Attending: Emergency Medicine | Admitting: Emergency Medicine

## 2020-04-20 ENCOUNTER — Other Ambulatory Visit: Payer: Self-pay

## 2020-04-20 DIAGNOSIS — Z5321 Procedure and treatment not carried out due to patient leaving prior to being seen by health care provider: Secondary | ICD-10-CM | POA: Diagnosis not present

## 2020-04-20 DIAGNOSIS — R509 Fever, unspecified: Secondary | ICD-10-CM | POA: Diagnosis not present

## 2020-04-20 DIAGNOSIS — U071 COVID-19: Secondary | ICD-10-CM | POA: Diagnosis not present

## 2020-04-20 DIAGNOSIS — R519 Headache, unspecified: Secondary | ICD-10-CM | POA: Diagnosis present

## 2020-04-20 NOTE — ED Notes (Signed)
Pt called multiple times, no answer 

## 2020-04-20 NOTE — ED Triage Notes (Signed)
Patient complains of ongoing headache and fever since being diagnosed with Covid on Tuesday. Alert and oriented, NAD

## 2020-04-20 NOTE — ED Notes (Signed)
Pt called for recheck vitals, no answer 

## 2020-04-21 ENCOUNTER — Emergency Department (HOSPITAL_BASED_OUTPATIENT_CLINIC_OR_DEPARTMENT_OTHER): Payer: 59

## 2020-04-21 ENCOUNTER — Emergency Department (HOSPITAL_BASED_OUTPATIENT_CLINIC_OR_DEPARTMENT_OTHER)
Admission: EM | Admit: 2020-04-21 | Discharge: 2020-04-21 | Disposition: A | Discharge status: Home / Self Care | Payer: 59 | Attending: Emergency Medicine | Admitting: Emergency Medicine

## 2020-04-21 ENCOUNTER — Encounter (HOSPITAL_BASED_OUTPATIENT_CLINIC_OR_DEPARTMENT_OTHER): Payer: Self-pay | Admitting: Emergency Medicine

## 2020-04-21 DIAGNOSIS — U071 COVID-19: Secondary | ICD-10-CM | POA: Insufficient documentation

## 2020-04-21 DIAGNOSIS — Z79899 Other long term (current) drug therapy: Secondary | ICD-10-CM | POA: Diagnosis not present

## 2020-04-21 DIAGNOSIS — R519 Headache, unspecified: Secondary | ICD-10-CM

## 2020-04-21 DIAGNOSIS — R0602 Shortness of breath: Secondary | ICD-10-CM

## 2020-04-21 DIAGNOSIS — I10 Essential (primary) hypertension: Secondary | ICD-10-CM | POA: Diagnosis not present

## 2020-04-21 LAB — URINALYSIS, ROUTINE W REFLEX MICROSCOPIC
Bilirubin Urine: NEGATIVE
Glucose, UA: 100 mg/dL — AB
Hgb urine dipstick: NEGATIVE
Ketones, ur: NEGATIVE mg/dL
Leukocytes,Ua: NEGATIVE
Nitrite: NEGATIVE
Protein, ur: 100 mg/dL — AB
Specific Gravity, Urine: 1.025 (ref 1.005–1.030)
pH: 6 (ref 5.0–8.0)

## 2020-04-21 LAB — CBC WITH DIFFERENTIAL/PLATELET
Abs Immature Granulocytes: 0.05 10*3/uL (ref 0.00–0.07)
Basophils Absolute: 0 10*3/uL (ref 0.0–0.1)
Basophils Relative: 0 %
Eosinophils Absolute: 0 10*3/uL (ref 0.0–0.5)
Eosinophils Relative: 0 %
HCT: 45.1 % (ref 39.0–52.0)
Hemoglobin: 14.4 g/dL (ref 13.0–17.0)
Immature Granulocytes: 1 %
Lymphocytes Relative: 15 %
Lymphs Abs: 0.5 10*3/uL — ABNORMAL LOW (ref 0.7–4.0)
MCH: 27.8 pg (ref 26.0–34.0)
MCHC: 31.9 g/dL (ref 30.0–36.0)
MCV: 87.1 fL (ref 80.0–100.0)
Monocytes Absolute: 0.1 10*3/uL (ref 0.1–1.0)
Monocytes Relative: 3 %
Neutro Abs: 2.9 10*3/uL (ref 1.7–7.7)
Neutrophils Relative %: 81 %
Platelets: 207 10*3/uL (ref 150–400)
RBC: 5.18 MIL/uL (ref 4.22–5.81)
RDW: 13.3 % (ref 11.5–15.5)
Smear Review: NORMAL
WBC: 3.5 10*3/uL — ABNORMAL LOW (ref 4.0–10.5)
nRBC: 0 % (ref 0.0–0.2)

## 2020-04-21 LAB — URINALYSIS, MICROSCOPIC (REFLEX)

## 2020-04-21 LAB — COMPREHENSIVE METABOLIC PANEL
ALT: 72 U/L — ABNORMAL HIGH (ref 0–44)
AST: 75 U/L — ABNORMAL HIGH (ref 15–41)
Albumin: 3.6 g/dL (ref 3.5–5.0)
Alkaline Phosphatase: 70 U/L (ref 38–126)
Anion gap: 10 (ref 5–15)
BUN: 10 mg/dL (ref 6–20)
CO2: 25 mmol/L (ref 22–32)
Calcium: 8.3 mg/dL — ABNORMAL LOW (ref 8.9–10.3)
Chloride: 98 mmol/L (ref 98–111)
Creatinine, Ser: 1.01 mg/dL (ref 0.61–1.24)
GFR calc Af Amer: >60 mL/min (ref >60)
GFR calc non Af Amer: >60 mL/min (ref >60)
Glucose, Bld: 114 mg/dL — ABNORMAL HIGH (ref 70–99)
Potassium: 3.8 mmol/L (ref 3.5–5.1)
Sodium: 133 mmol/L — ABNORMAL LOW (ref 135–145)
Total Bilirubin: 0.5 mg/dL (ref 0.3–1.2)
Total Protein: 7.5 g/dL (ref 6.5–8.1)

## 2020-04-21 LAB — LACTIC ACID, PLASMA: Lactic Acid, Venous: 1.1 mmol/L (ref 0.5–1.9)

## 2020-04-21 IMAGING — DX DG CHEST 1V PORT
1 series · 1 of 1 positions shown · non-contrast
Comparison: No priors.

CLINICAL DATA: 53-year-old male with history of fever and shortness
of breath. Tested positive for COVID six days ago.

EXAM:
PORTABLE CHEST 1 VIEW

[chest ap]
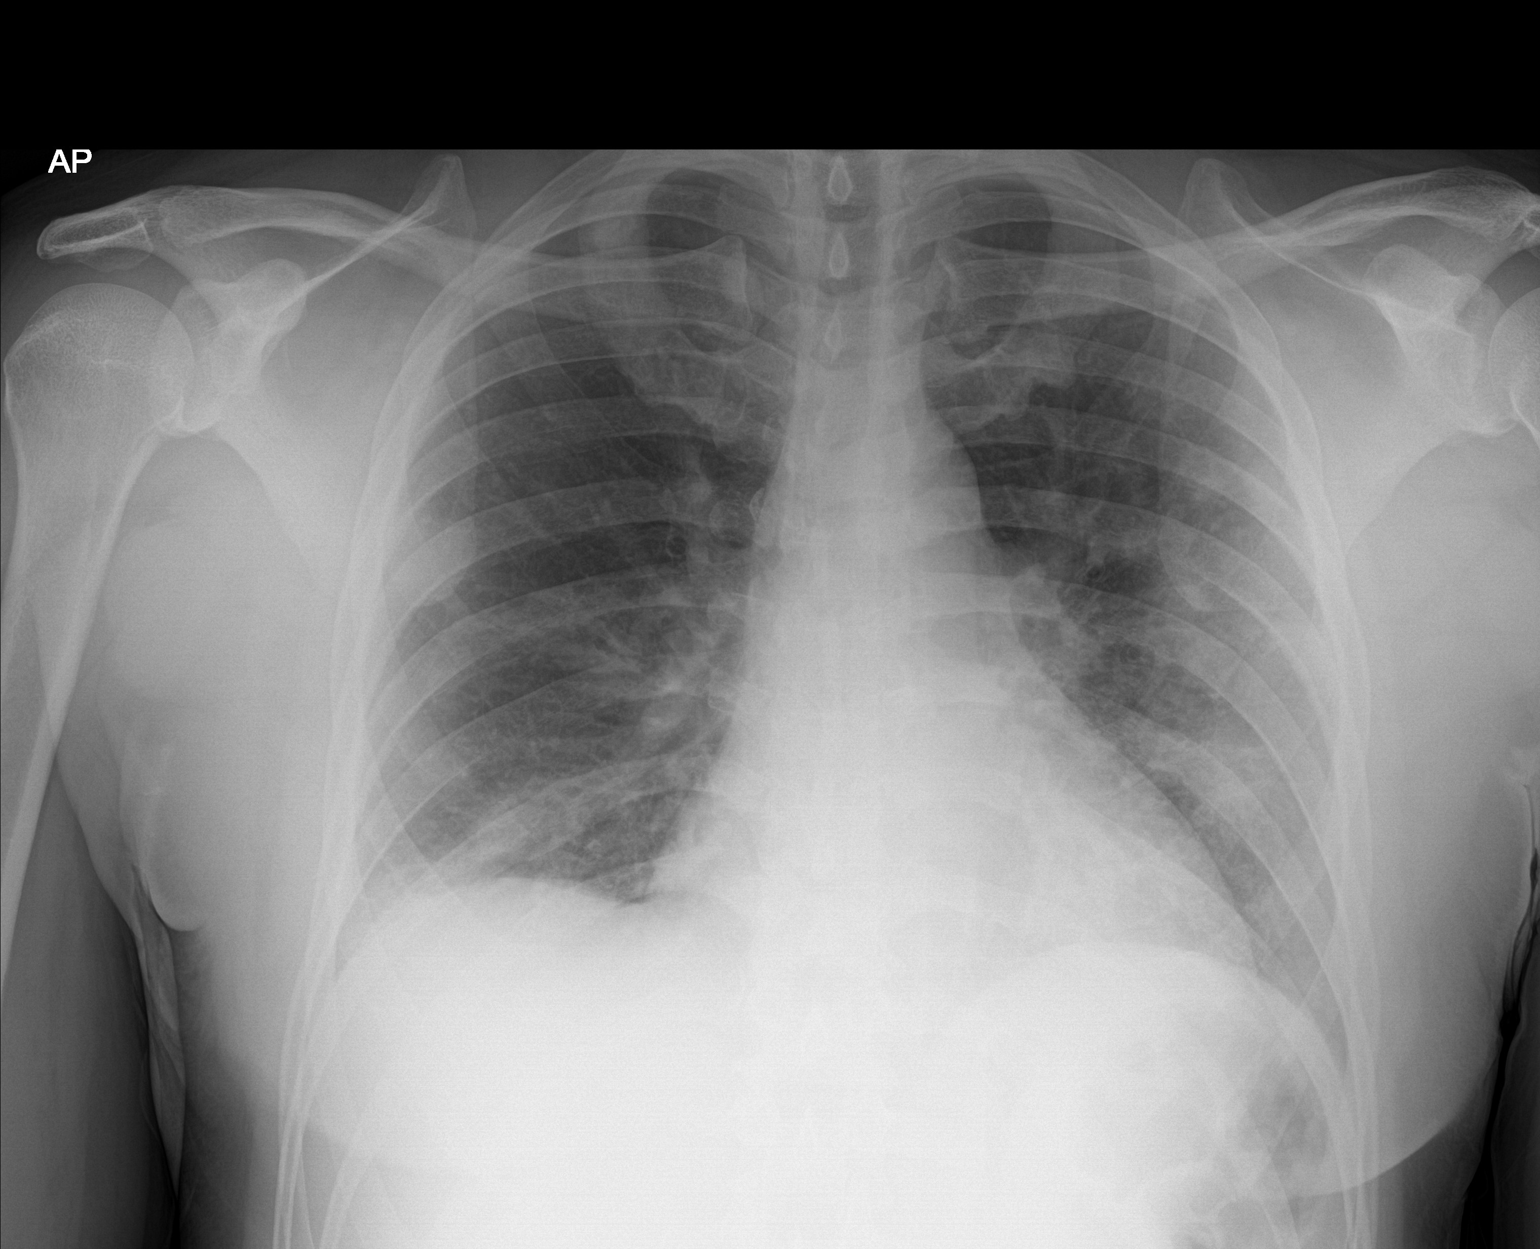

[1 of 1 positions shown; findings below may reference images not displayed]

FINDINGS: Patchy areas of interstitial prominence an ill-defined opacities are
noted throughout the mid to lower lungs bilaterally, compatible with
multilobar bilateral pneumonia. No pleural effusions. No evidence of
pulmonary edema. Heart size is normal. Upper mediastinal contours
are within normal limits.
IMPRESSION: 1. The appearance of the chest is compatible with multilobar
bilateral pneumonia, as above.

## 2020-04-21 MED ORDER — ACETAMINOPHEN 325 MG PO TABS
650.0000 mg | ORAL_TABLET | Freq: Once | ORAL | Status: AC | PRN
  Administered 2020-04-21: 650 mg via ORAL
  Filled 2020-04-21: qty 2

## 2020-04-21 MED ORDER — SODIUM CHLORIDE 0.9 % IV BOLUS
1000.0000 mL | Freq: Once | INTRAVENOUS | Status: AC
  Administered 2020-04-21: 1000 mL via INTRAVENOUS

## 2020-04-21 MED ORDER — PROCHLORPERAZINE EDISYLATE 10 MG/2ML IJ SOLN
10.0000 mg | Freq: Once | INTRAMUSCULAR | Status: AC
  Administered 2020-04-21: 10 mg via INTRAVENOUS
  Filled 2020-04-21: qty 2

## 2020-04-21 MED ORDER — DIPHENHYDRAMINE HCL 50 MG/ML IJ SOLN
25.0000 mg | Freq: Once | INTRAMUSCULAR | Status: AC
  Administered 2020-04-21: 25 mg via INTRAVENOUS
  Filled 2020-04-21: qty 1

## 2020-04-21 NOTE — ED Notes (Signed)
ED Provider at bedside. 

## 2020-04-21 NOTE — ED Provider Notes (Signed)
Jared Park   CSN: [id number] Arrival date & time: 04/21/20  5956     History Chief Complaint  Patient presents with  . Shortness of Breath    COVID+    Jared Dave. is a 54 y.o. male.  The history is provided by the patient and medical records. No language interpreter was used.  Shortness of Breath Severity:  Moderate Onset quality:  Gradual Duration:  3 days Timing:  Constant Progression:  Waxing and waning Context: URI   Relieved by:  Nothing Worsened by:  Coughing Ineffective treatments:  None tried Associated symptoms: cough, fever, headaches and sputum production   Associated symptoms: no abdominal pain, no chest pain, no diaphoresis, no neck pain, no rash, no syncope, no vomiting and no wheezing        Past Medical History:  Diagnosis Date  . Hypertension    Was told "pre-hypertension" - no meds needed    Patient Active Problem List   Diagnosis Date Noted  . Elevated LDL cholesterol level 03/12/2020  . Cervical strain 03/12/2020  . COMMON MIGRAINE 05/17/2007    Past Surgical History:  Procedure Laterality Date  . WISDOM TOOTH EXTRACTION     age 68       Family History  Problem Relation Age of Onset  . Diabetes Mother   . Hyperlipidemia Mother   . Hypertension Mother   . Stroke Father   . Healthy Sister   . Healthy Brother   . Colon polyps Neg Hx   . Colon cancer Neg Hx   . Esophageal cancer Neg Hx   . Rectal cancer Neg Hx   . Stomach cancer Neg Hx     Social History   Tobacco Use  . Smoking status: Never Smoker  . Smokeless tobacco: Never Used  Substance Use Topics  . Alcohol use: Yes    Comment: 2-3 beers 4-5 days weekly  . Drug use: No    Home Medications Prior to Admission medications   Medication Sig Start Date End Date Taking? Authorizing Provider  meloxicam (MOBIC) 7.5 MG tablet Take 1 tablet (7.5 mg total) by mouth daily. 03/12/20   Jared Maw, Jared Park    methocarbamol (ROBAXIN) 500 MG tablet Take 1 tablet (500 mg total) by mouth every 8 (eight) hours as needed for muscle spasms. 03/12/20   Jared Maw, Jared Park    Allergies    Patient has no known allergies.  Review of Systems   Review of Systems  Constitutional: Positive for chills, fatigue and fever. Negative for diaphoresis.  HENT: Negative for congestion and rhinorrhea.   Eyes: Negative for visual disturbance.  Respiratory: Positive for cough, sputum production and shortness of breath. Negative for chest tightness and wheezing.   Cardiovascular: Negative for chest pain, palpitations, leg swelling and syncope.  Gastrointestinal: Negative for abdominal pain, constipation, diarrhea, nausea and vomiting.  Genitourinary: Negative for dysuria, flank pain and frequency.  Musculoskeletal: Negative for back pain, neck pain and neck stiffness.  Skin: Negative for rash and wound.  Neurological: Positive for headaches. Negative for dizziness and light-headedness.  Psychiatric/Behavioral: Negative for agitation and confusion.  All other systems reviewed and are negative.   Physical Exam Updated Vital Signs BP 129/72 (BP Location: Right Arm)   Pulse 84   Temp 100 F (37.8 C) (Oral)   Resp 20   Ht 5\' 6"  (1.676 m)   Wt 84.4 kg   SpO2 96%   BMI 30.02 kg/m  Physical Exam Vitals and nursing Park reviewed.  Constitutional:      General: He is not in acute distress.    Appearance: He is well-developed. He is not ill-appearing, toxic-appearing or diaphoretic.  HENT:     Head: Normocephalic and atraumatic.  Eyes:     Conjunctiva/sclera: Conjunctivae normal.     Pupils: Pupils are equal, round, and reactive to light.  Cardiovascular:     Rate and Rhythm: Normal rate and regular rhythm.     Heart sounds: No murmur heard.   Pulmonary:     Effort: Pulmonary effort is normal. No tachypnea or respiratory distress.     Breath sounds: Normal breath sounds. No decreased breath sounds,  wheezing, rhonchi or rales.  Chest:     Chest wall: No tenderness.  Abdominal:     Palpations: Abdomen is soft.     Tenderness: There is no abdominal tenderness.  Musculoskeletal:     Cervical back: Neck supple.     Right lower leg: No tenderness. No edema.     Left lower leg: No tenderness. No edema.  Skin:    General: Skin is warm and dry.     Capillary Refill: Capillary refill takes less than 2 seconds.     Findings: No erythema.  Neurological:     General: No focal deficit present.     Mental Status: He is alert.  Psychiatric:        Mood and Affect: Mood normal.     ED Results / Procedures / Treatments   Labs (all labs ordered are listed, but only abnormal results are displayed) Labs Reviewed  COMPREHENSIVE METABOLIC PANEL - Abnormal; Notable for the following components:      Result Value   Sodium 133 (*)    Glucose, Bld 114 (*)    Calcium 8.3 (*)    AST 75 (*)    ALT 72 (*)    All other components within normal limits  CBC WITH DIFFERENTIAL/PLATELET - Abnormal; Notable for the following components:   WBC 3.5 (*)    Lymphs Abs 0.5 (*)    All other components within normal limits  URINALYSIS, ROUTINE W REFLEX MICROSCOPIC - Abnormal; Notable for the following components:   Color, Urine AMBER (*)    Glucose, UA 100 (*)    Protein, ur 100 (*)    All other components within normal limits  URINALYSIS, MICROSCOPIC (REFLEX) - Abnormal; Notable for the following components:   Bacteria, UA FEW (*)    All other components within normal limits  LACTIC ACID, PLASMA  LACTIC ACID, PLASMA    EKG EKG Interpretation  Date/Time:  Sunday April 21 2020 09:06:58 EDT Ventricular Rate:  78 PR Interval:  166 QRS Duration: 86 QT Interval:  366 QTC Calculation: 417 R Axis:   84 Text Interpretation: Normal sinus rhythm Nonspecific T wave abnormality Abnormal ECG No prior ECG for comparison. NO STEMI Confirmed by Jared Park [phone]) on 04/21/2020 11:13:25 AM   Radiology DG  Chest Portable 1 View  Result Date: 04/21/2020 CLINICAL DATA:  54 year old male with history of fever and shortness of breath. Tested positive for COVID six days ago. EXAM: PORTABLE CHEST 1 VIEW COMPARISON:  No priors. FINDINGS: Patchy areas of interstitial prominence an ill-defined opacities are noted throughout the mid to lower lungs bilaterally, compatible with multilobar bilateral pneumonia. No pleural effusions. No evidence of pulmonary edema. Heart size is normal. Upper mediastinal contours are within normal limits. IMPRESSION: 1. The appearance of the chest is  compatible with multilobar bilateral pneumonia, as above. Electronically Signed   By: Jared Langton M.D.   On: 04/21/2020 10:19    Procedures Procedures (including critical care time)  Medications Ordered in ED Medications  acetaminophen (TYLENOL) tablet 650 mg (650 mg Oral Given 04/21/20 0829)  prochlorperazine (COMPAZINE) injection 10 mg (10 mg Intravenous Given 04/21/20 1259)  diphenhydrAMINE (BENADRYL) injection 25 mg (25 mg Intravenous Given 04/21/20 1257)  sodium chloride 0.9 % bolus 1,000 mL ( Intravenous Stopped 04/21/20 1405)   Jared Park. was evaluated in Emergency Department on 04/21/2020 for the symptoms described in the history of present illness. He was evaluated in the context of the global COVID-19 pandemic, which necessitated consideration that the patient might be at risk for infection with the SARS-CoV-2 virus that causes COVID-19. Institutional protocols and algorithms that pertain to the evaluation of patients at risk for COVID-19 are in a state of rapid change based on information released by regulatory bodies including the CDC and federal and state organizations. These policies and algorithms were followed during the patient's care in the ED.   ED Course  I have reviewed the triage vital signs and the nursing notes.  Pertinent labs & imaging results that were available during my care of the patient were  reviewed by me and considered in my medical decision making (see chart for details).    MDM Rules/Calculators/A&P                          Tighe Gitto. is a 54 y.o. male with a past medical history significant for hypertension and migraines who presents with shortness of breath, cough, fevers, chills, myalgias, and headache.  Patient reports he was diagnosed with COVID-19 last week and has been feeling worse over the last few days.  He reports he is having some shortness of breath and productive cough with a phlegm-like sputum.  He has been treating his fevers at home with Motrin and Tylenol.  He says that he has been having headaches over the last few days that feels similar to prior headaches.  He does not report any neck pain or neck stiffness.  He reports some nausea no vomiting.  He denies any GI symptoms otherwise.  He denies any leg pain or leg swelling or history of DVT or PE.  He reports that with his shortness of breath worsening and the myalgias and headache he wanted to get evaluated.  On exam, lungs have coarseness bilaterally.  No wheezing.  Chest and abdomen are nontender.  No focal neurologic deficits on initial exam.  Patient resting comfortably.  I ambulated patient with a normal gait and his oxygen saturations did not drop below 91% on room air.  Patient had imaging and labs initially showing he does have what appears to be multifocal pneumonia with no effusions or pneumothorax.  I suspect this is Covid pneumonia given his normal lactic acid and nonelevated white blood cell count.  No anemia seen.  Normal kidney function.  Given his reassuring exam and lack of hypoxia, I do feel he will be safe for discharge home for outpatient management of COVID-19.  We will give him a headache cocktail to try to help his headache and then reassess.  Dissipate discharge after reassessment.  3:21 PM Patient's headache improved drastically with headache cocktail.  His work-up remains reassuring  for COVID-19 infection.  His oxygen saturations remain over 94% on room air and we feel  he is safe for discharge home given improved symptoms.  He will follow up with PCP and understands return precautions.  He had no other questions or concerns and was discharged in good condition with improving symptoms.   Final Clinical Impression(s) / ED Diagnoses Final diagnoses:  COVID-19  Shortness of breath  Acute nonintractable headache, unspecified headache type    Clinical Impression: 1. COVID-19   2. Shortness of breath   3. Acute nonintractable headache, unspecified headache type     Disposition: Discharge  Condition: Good  I have discussed the results, Dx and Tx plan with the pt(& family if present). He/she/they expressed understanding and agree(s) with the plan. Discharge instructions discussed at great length. Strict return precautions discussed and pt &/or family have verbalized understanding of the instructions. No further questions at time of discharge.    New Prescriptions   No medications on file    Follow Up: Jared Maw, Jared Park [place] [address] [phone]     [facility] [address] [id number] [address] [place] [phone]        Jared Park, Jared Allegra, Jared Park 04/21/20 [phone]

## 2020-04-21 NOTE — Discharge Instructions (Signed)
Your work-up today was consistent with shortness of breath from your COVID-19 infection.  The x-ray showed inflammation and likely Covid pneumonia.  Your oxygen saturations remained above 90 even with ambulation and so we feel you are safe for discharge home.  Please rest and stay hydrated and follow-up with your primary doctor.  Please stay isolated so as not to spread the infection.  Your headache improved with medications, please use over-the-counter anti-inflammatory medication help with your symptoms.  If any symptoms change or worsen, please return to nearest emergency department.

## 2020-04-21 NOTE — ED Notes (Signed)
Pt made aware of the need for a urine specimen. States he went in the waiting room so unable to void at this moment.

## 2020-04-21 NOTE — ED Notes (Signed)
While doing labs, pt, got very diaphoretic, had a near syncopal episode with urine incontinence, became very dizzy, got "real hot".  I finished labwork, then performed EKG and was given to Dr. Sherry Ruffing.  Had pt change into paper scrubs, told him that if anything changed or got worse, to let us know asap. Covid +.

## 2020-04-21 NOTE — ED Triage Notes (Signed)
Headache, fever, and SOB since being dx with COVID on Tuesday.

## 2020-04-29 ENCOUNTER — Telehealth: Payer: Self-pay

## 2020-04-29 NOTE — Telephone Encounter (Signed)
Spoke with patients wife who states that both Jared Park and Jared Park are positive for Covod JaredPark is in the hospital right now (possible going home tomorrow) per Mrs. Millhouse the provider who is seeing her recommended that Dr. Ethelene Hal sends in a order for Jared Park to receive Remdesivir injection and also steroids to help with his symptoms. Please advise.

## 2020-04-29 NOTE — Telephone Encounter (Signed)
Needs to be over 55 with chronic medical illness. Chart review shows that it is 11 days since the onset of disease for him. If he is still sick he should consider going back the ER if he is having difficulty breathing or set up a virtual with me.

## 2020-04-30 NOTE — Telephone Encounter (Signed)
Family aware and will call to schedule an appointment if no improvement

## 2020-05-17 ENCOUNTER — Telehealth (INDEPENDENT_AMBULATORY_CARE_PROVIDER_SITE_OTHER): Payer: 59 | Admitting: Family Medicine

## 2020-05-17 ENCOUNTER — Encounter: Payer: Self-pay | Admitting: Family Medicine

## 2020-05-17 VITALS — Ht 66.0 in | Wt 177.0 lb

## 2020-05-17 DIAGNOSIS — Z8616 Personal history of COVID-19: Secondary | ICD-10-CM | POA: Diagnosis not present

## 2020-05-17 NOTE — Progress Notes (Signed)
Established Patient Office Visit  Subjective:  Patient ID: Jared Cerami., male    DOB: 05/04/1966  Age: 54 y.o. MRN: [id number]  CC:  Chief Complaint  Patient presents with  . Follow-up    hospital follow up on positive COVID patient states that he still has some SOB and fatigue a lot.     HPI Jared S Noy Jr. presents for follow-up of his recent COVID-19 infection.  He was diagnosed back on August 22.  He had developed a viral pneumonia.  He was maintaining his oxygen levels and was otherwise stable.  He was able to recover at home.  He did lose his senses of taste and smell for 3 to 4 days but they have returned.  He is no longer febrile.  Denies cough or sputum production.  Remains fatigued.  Denies shortness of breath, dyspnea or chest pain.  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  Past Medical History:  Diagnosis Date  . Hypertension    Was told "pre-hypertension" - no meds needed    Past Surgical History:  Procedure Laterality Date  . WISDOM TOOTH EXTRACTION     age 16    Family History  Problem Relation Age of Onset  . Diabetes Mother   . Hyperlipidemia Mother   . Hypertension Mother   . Stroke Father   . Healthy Sister   . Healthy Brother   . Colon polyps Neg Hx   . Colon cancer Neg Hx   . Esophageal cancer Neg Hx   . Rectal cancer Neg Hx   . Stomach cancer Neg Hx     Social History   Socioeconomic History  . Marital status: Married    Spouse name: Not on file  . Number of children: Not on file  . Years of education: Not on file  . Highest education level: Not on file  Occupational History  . Not on file  Tobacco Use  . Smoking status: Never Smoker  . Smokeless tobacco: Never Used  Substance and Sexual Activity  . Alcohol use: Yes    Comment: 2-3 beers 4-5 days weekly  . Drug use: No  . Sexual activity: Yes  Other Topics Concern  . Not on file  Social History Narrative  . Not on file   Social Determinants of  Health   Financial Resource Strain:   . Difficulty of Paying Living Expenses: Not on file  Food Insecurity:   . Worried About Charity fundraiser in the Last Year: Not on file  . Ran Out of Food in the Last Year: Not on file  Transportation Needs:   . Lack of Transportation (Medical): Not on file  . Lack of Transportation (Non-Medical): Not on file  Physical Activity:   . Days of Exercise per Week: Not on file  . Minutes of Exercise per Session: Not on file  Stress:   . Feeling of Stress : Not on file  Social Connections:   . Frequency of Communication with Friends and Family: Not on file  . Frequency of Social Gatherings with Friends and Family: Not on file  . Attends Religious Services: Not on file  . Active Member of Clubs or Organizations: Not on file  . Attends Archivist Meetings: Not on file  . Marital Status: Not on file  Intimate Partner Violence:   . Fear of Current or Ex-Partner: Not on file  . Emotionally Abused: Not on file  .  Physically Abused: Not on file  . Sexually Abused: Not on file    Outpatient Medications Prior to Visit  Medication Sig Dispense Refill  . meloxicam (MOBIC) 7.5 MG tablet Take 1 tablet (7.5 mg total) by mouth daily. (Patient not taking: Reported on 05/17/2020) 30 tablet 0  . methocarbamol (ROBAXIN) 500 MG tablet Take 1 tablet (500 mg total) by mouth every 8 (eight) hours as needed for muscle spasms. (Patient not taking: Reported on 05/17/2020) 40 tablet 0   No facility-administered medications prior to visit.    No Known Allergies  ROS Review of Systems  Constitutional: Negative for chills, diaphoresis, fatigue, fever and unexpected weight change.  HENT: Negative.   Eyes: Negative for photophobia and visual disturbance.  Respiratory: Negative.  Negative for cough, chest tightness and shortness of breath.   Cardiovascular: Negative.   Gastrointestinal: Negative.   Genitourinary: Negative.   Psychiatric/Behavioral: Negative.        Objective:    Physical Exam Vitals and nursing note reviewed.  Constitutional:      General: He is not in acute distress.    Appearance: Normal appearance. He is not ill-appearing, toxic-appearing or diaphoretic.  HENT:     Right Ear: External ear normal.     Left Ear: External ear normal.  Eyes:     General: No scleral icterus.       Right eye: No discharge.        Left eye: No discharge.     Conjunctiva/sclera: Conjunctivae normal.  Pulmonary:     Effort: Pulmonary effort is normal.  Neurological:     Mental Status: He is alert and oriented to person, place, and time.  Psychiatric:        Mood and Affect: Mood normal.        Behavior: Behavior normal.     Ht 5\' 6"  (1.676 m)   Wt 177 lb (80.3 kg)   BMI 28.57 kg/m  Wt Readings from Last 3 Encounters:  05/17/20 177 lb (80.3 kg)  04/21/20 186 lb (84.4 kg)  04/20/20 186 lb (84.4 kg)     Health Maintenance Due  Topic Date Due  . Hepatitis C Screening  Never done  . COVID-19 Vaccine (1) Never done  . INFLUENZA VACCINE  03/31/2020    There are no preventive care reminders to display for this patient.  Lab Results  Component Value Date   TSH 1.450 02/23/2018   Lab Results  Component Value Date   WBC 3.5 (L) 04/21/2020   HGB 14.4 04/21/2020   HCT 45.1 04/21/2020   MCV 87.1 04/21/2020   PLT 207 04/21/2020   Lab Results  Component Value Date   NA 133 (L) 04/21/2020   K 3.8 04/21/2020   CO2 25 04/21/2020   GLUCOSE 114 (H) 04/21/2020   BUN 10 04/21/2020   CREATININE 1.01 04/21/2020   BILITOT 0.5 04/21/2020   ALKPHOS 70 04/21/2020   AST 75 (H) 04/21/2020   ALT 72 (H) 04/21/2020   PROT 7.5 04/21/2020   ALBUMIN 3.6 04/21/2020   CALCIUM 8.3 (L) 04/21/2020   ANIONGAP 10 04/21/2020   GFR 82.83 04/20/2019   Lab Results  Component Value Date   CHOL 251 (H) 04/20/2019   Lab Results  Component Value Date   HDL 50.50 04/20/2019   Lab Results  Component Value Date   LDLCALC 170 (H) 04/20/2019    Lab Results  Component Value Date   TRIG 154.0 (H) 04/20/2019   Lab Results  Component Value  Date   CHOLHDL 5 04/20/2019   No results found for: HGBA1C    Assessment & Plan:   Problem List Items Addressed This Visit    None    Visit Diagnoses    History of COVID-19    -  Primary      No orders of the defined types were placed in this encounter.   Follow-up: Return if symptoms worsen or fail to improve.  Believe that he should remain at home to rest and recuperate.  Return to work on October 4.  He will go ahead and obtain his Covid vaccine.  Virtual Visit via Video Note  I connected with Trail on 05/17/20 at  3:00 PM EDT by a video enabled telemedicine application and verified that I am speaking with the correct person using two identifiers.  Location: Patient: home with wife.  Provider:    I discussed the limitations of evaluation and management by telemedicine and the availability of in person appointments. The patient expressed understanding and agreed to proceed.  History of Present Illness:    Observations/Objective:   Assessment and Plan:   Follow Up Instructions:    I discussed the assessment and treatment plan with the patient. The patient was provided an opportunity to ask questions and all were answered. The patient agreed with the plan and demonstrated an understanding of the instructions.   The patient was advised to call back or seek an in-person evaluation if the symptoms worsen or if the condition fails to improve as anticipated.  I provided 20 minutes of non-face-to-face time during this encounter.   [name], MD   [name], MD

## 2020-05-30 ENCOUNTER — Telehealth: Payer: Self-pay | Admitting: Family Medicine

## 2020-05-30 NOTE — Telephone Encounter (Signed)
Patient calling states that he still does not feel well after COVID he still has frequent shortness of breath and does not feel like he is able to return to work just yet. Would like a note to be out of work for a little longer. Please advise.

## 2020-05-30 NOTE — Telephone Encounter (Signed)
Patient states he is still short of breath and not able to go back to work next. He would like an updated letter regarding being out of work. Please call patient to advise.

## 2020-05-30 NOTE — Telephone Encounter (Signed)
Okay to go back Monday.

## 2020-06-10 ENCOUNTER — Other Ambulatory Visit: Payer: Self-pay

## 2020-06-11 ENCOUNTER — Encounter: Payer: Self-pay | Admitting: Family Medicine

## 2020-06-11 ENCOUNTER — Ambulatory Visit (INDEPENDENT_AMBULATORY_CARE_PROVIDER_SITE_OTHER): Payer: 59 | Admitting: Family Medicine

## 2020-06-11 ENCOUNTER — Ambulatory Visit (INDEPENDENT_AMBULATORY_CARE_PROVIDER_SITE_OTHER): Payer: 59

## 2020-06-11 VITALS — BP 120/72 | HR 84 | Temp 97.1°F | Ht 66.0 in | Wt 179.4 lb

## 2020-06-11 DIAGNOSIS — R7989 Other specified abnormal findings of blood chemistry: Secondary | ICD-10-CM | POA: Diagnosis not present

## 2020-06-11 DIAGNOSIS — R0600 Dyspnea, unspecified: Secondary | ICD-10-CM

## 2020-06-11 DIAGNOSIS — R0609 Other forms of dyspnea: Secondary | ICD-10-CM

## 2020-06-11 LAB — HEPATIC FUNCTION PANEL
ALT: 20 U/L (ref 0–53)
AST: 14 U/L (ref 0–37)
Albumin: 4.1 g/dL (ref 3.5–5.2)
Alkaline Phosphatase: 65 U/L (ref 39–117)
Bilirubin, Direct: 0.1 mg/dL (ref 0.0–0.3)
Total Bilirubin: 0.7 mg/dL (ref 0.2–1.2)
Total Protein: 7.1 g/dL (ref 6.0–8.3)

## 2020-06-11 LAB — BASIC METABOLIC PANEL
BUN: 12 mg/dL (ref 6–23)
CO2: 30 mEq/L (ref 19–32)
Calcium: 9.4 mg/dL (ref 8.4–10.5)
Chloride: 104 mEq/L (ref 96–112)
Creatinine, Ser: 1.08 mg/dL (ref 0.40–1.50)
GFR: 78.5 mL/min (ref >60.00)
Glucose, Bld: 80 mg/dL (ref 70–99)
Potassium: 4.2 mEq/L (ref 3.5–5.1)
Sodium: 140 mEq/L (ref 135–145)

## 2020-06-11 LAB — CBC
HCT: 41 % (ref 39.0–52.0)
Hemoglobin: 13.3 g/dL (ref 13.0–17.0)
MCHC: 32.4 g/dL (ref 30.0–36.0)
MCV: 86.6 fl (ref 78.0–100.0)
Platelets: 342 10*3/uL (ref 150.0–400.0)
RBC: 4.73 Mil/uL (ref 4.22–5.81)
RDW: 13.8 % (ref 11.5–15.5)
WBC: 4.2 10*3/uL (ref 4.0–10.5)

## 2020-06-11 IMAGING — DX DG CHEST 2V
2 series · 2 of 2 positions shown · non-contrast
Comparison: [DATE]

CLINICAL DATA: Shortness of breath with exertion

EXAM:
CHEST - 2 VIEW

[chest pa]
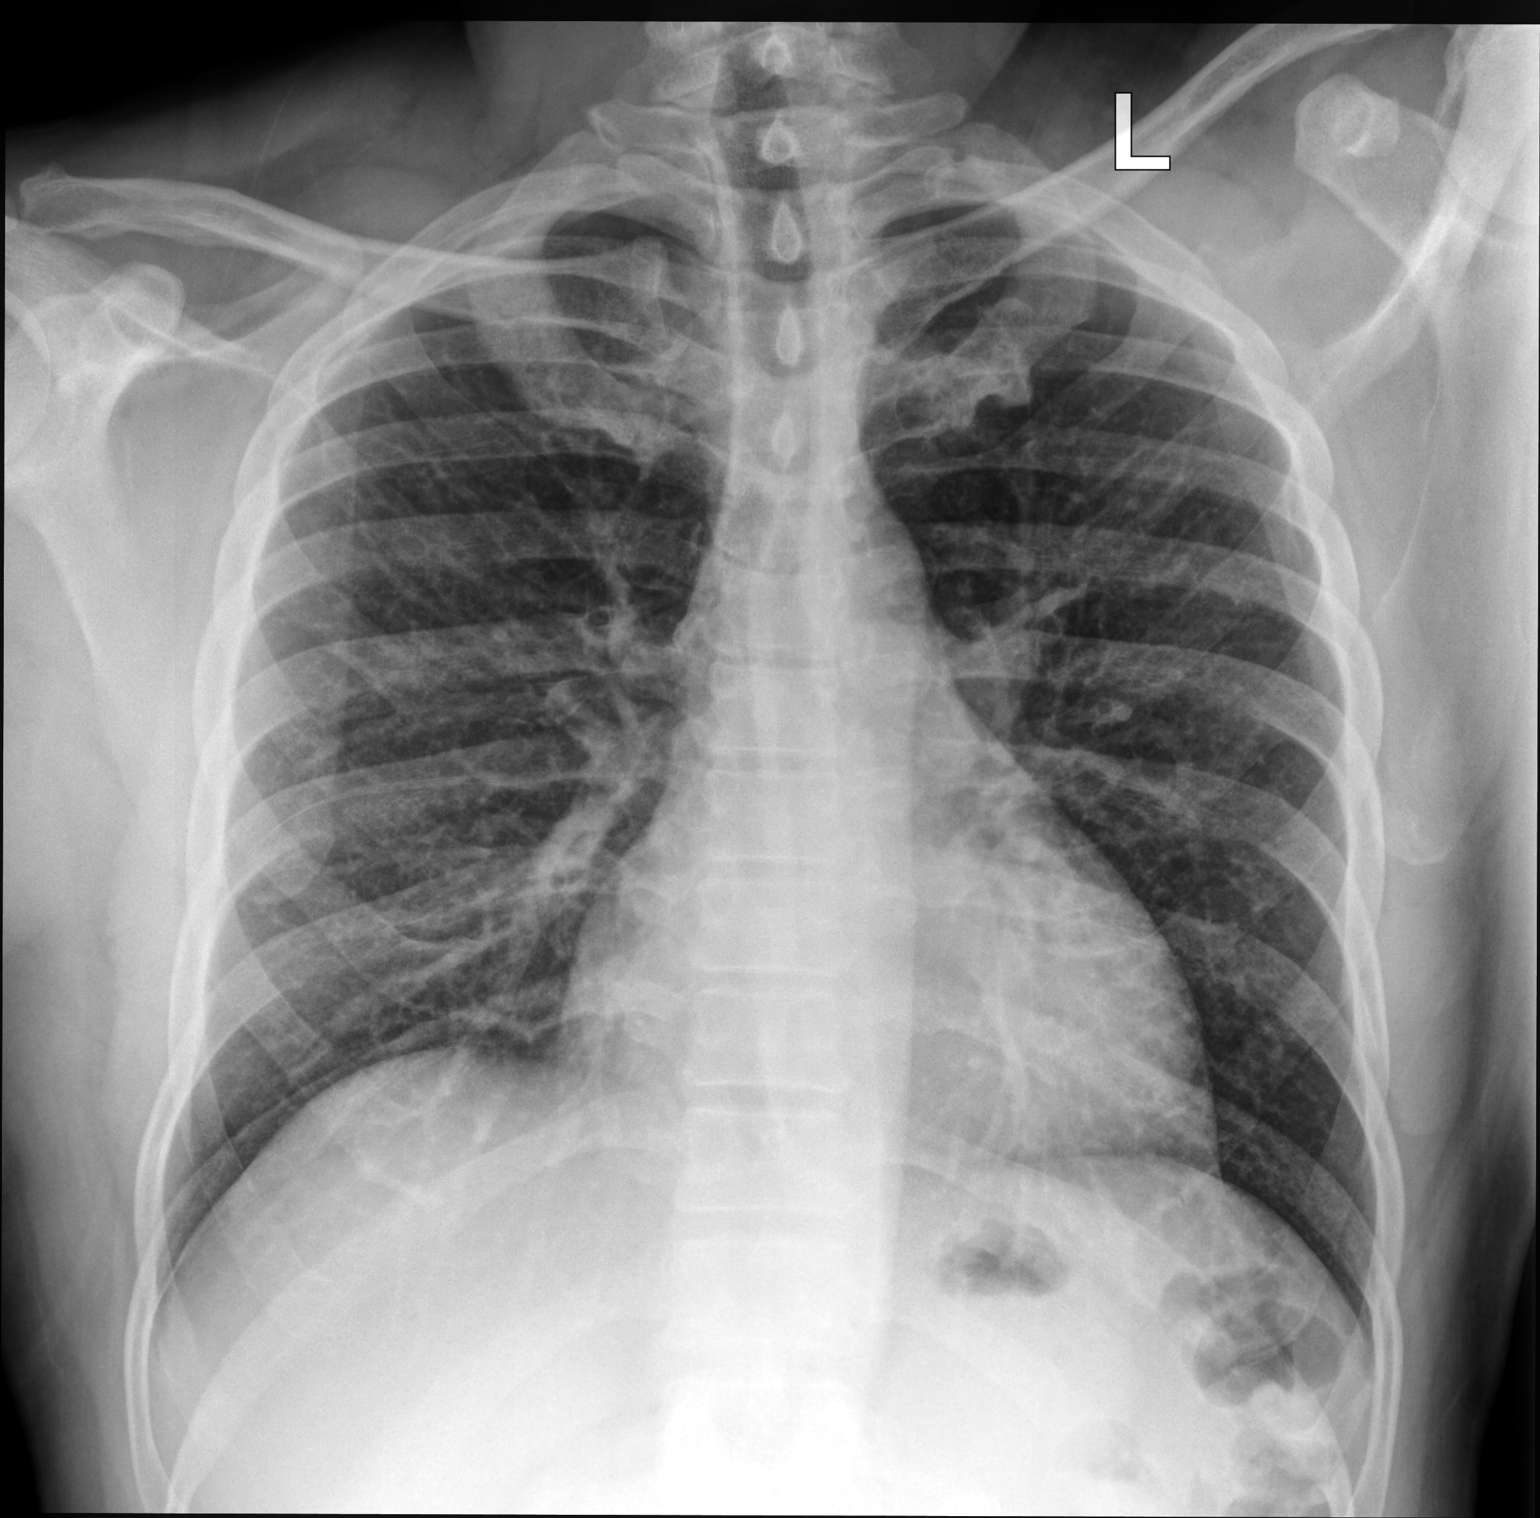

[chest lat]
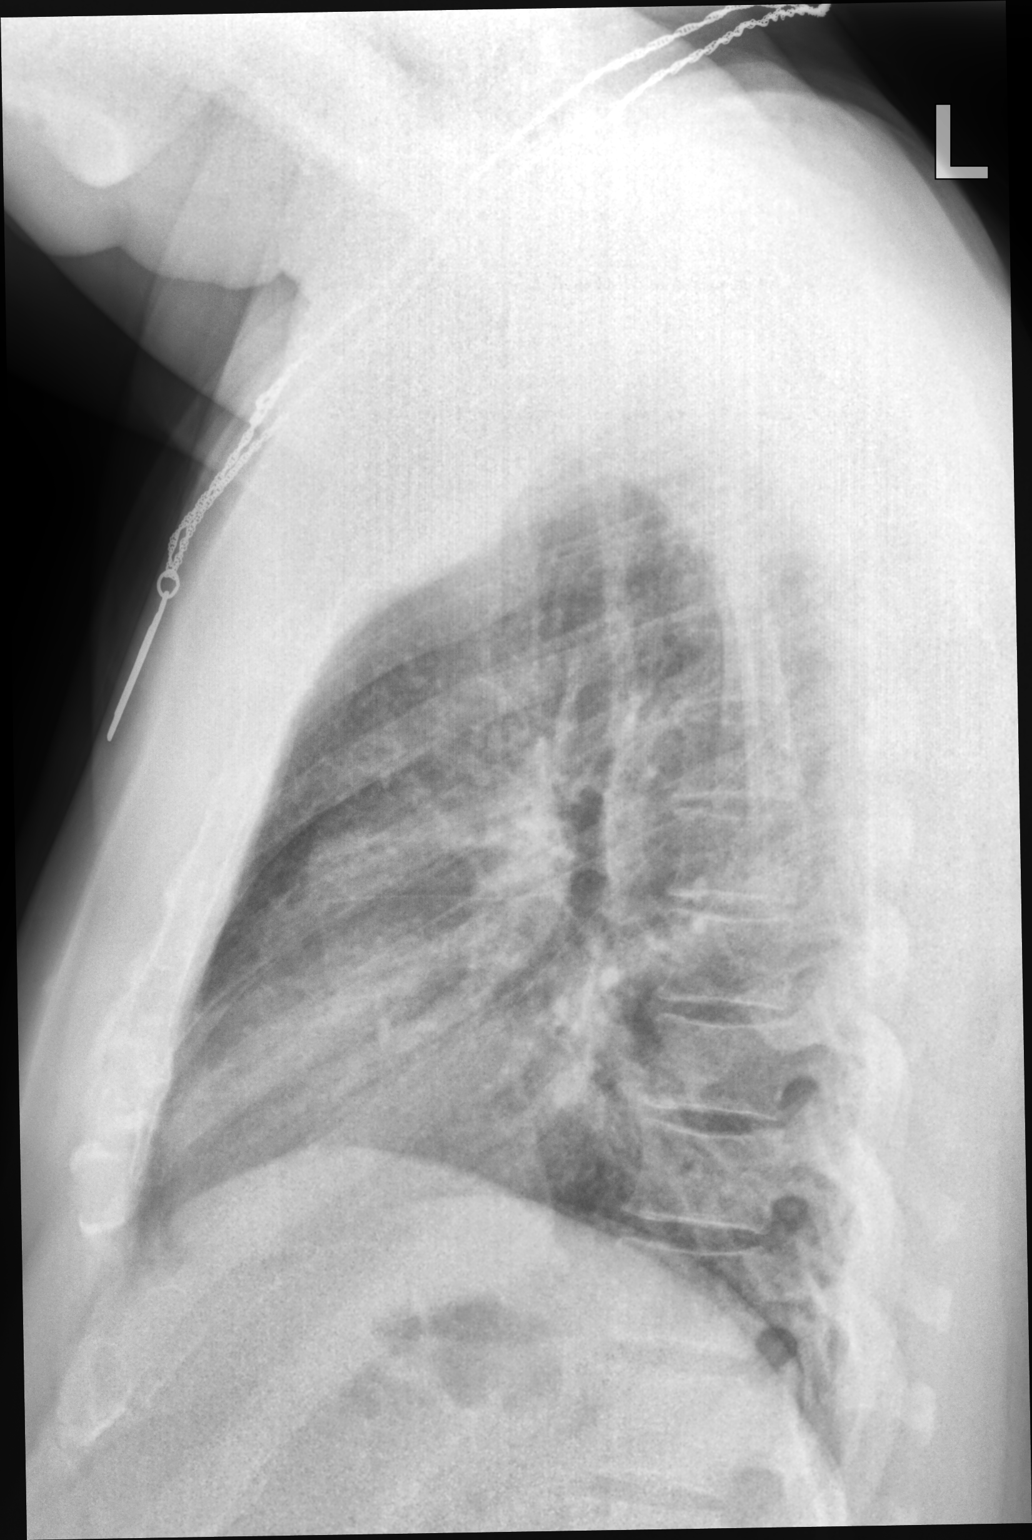

[2 of 2 positions shown; findings below may reference images not displayed]

FINDINGS: Heart and mediastinal contours are within normal limits. No focal
opacities or effusions. No acute bony abnormality.
IMPRESSION: No active cardiopulmonary disease.

## 2020-06-11 NOTE — Progress Notes (Signed)
Established Patient Office Visit  Subjective:  Patient ID: Jared Lupinacci., male    DOB: [birth year]  Age: 54 y.o. MRN: [id number]  CC:  Chief Complaint  Patient presents with  . Follow-up    follow up on post covid per patient he feels SOB a lot when active.     HPI Jared S Issac Jr. presents for ongoing dyspnea on exertion status post Covid in action this past August.  Chest x-ray obtained on August 22 showed multilobular bilateral pneumonia.  He had been diagnosed with Covid 6 days prior.  He has no history of asthma.  He does not smoke.  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  He denies chest pain nausea or diaphoresis.  Review chemistry panel obtained back in August did show elevated liver enzymes.  Denies any alcohol use since his diagnosis of Covid.  No history of MI.  Past Medical History:  Diagnosis Date  . Hypertension    Was told "pre-hypertension" - no meds needed    Past Surgical History:  Procedure Laterality Date  . WISDOM TOOTH EXTRACTION     age 39    Family History  Problem Relation Age of Onset  . Diabetes Mother   . Hyperlipidemia Mother   . Hypertension Mother   . Stroke Father   . Healthy Sister   . Healthy Brother   . Colon polyps Neg Hx   . Colon cancer Neg Hx   . Esophageal cancer Neg Hx   . Rectal cancer Neg Hx   . Stomach cancer Neg Hx     Social History   Socioeconomic History  . Marital status: Married    Spouse name: Not on file  . Number of children: Not on file  . Years of education: Not on file  . Highest education level: Not on file  Occupational History  . Not on file  Tobacco Use  . Smoking status: Never Smoker  . Smokeless tobacco: Never Used  Substance and Sexual Activity  . Alcohol use: Yes    Comment: 2-3 beers 4-5 days weekly  . Drug use: No  . Sexual activity: Yes  Other Topics Concern  . Not on file  Social History Narrative  . Not on file   Social Determinants of Health    Financial Resource Strain:   . Difficulty of Paying Living Expenses: Not on file  Food Insecurity:   . Worried About Charity fundraiser in the Last Year: Not on file  . Ran Out of Food in the Last Year: Not on file  Transportation Needs:   . Lack of Transportation (Medical): Not on file  . Lack of Transportation (Non-Medical): Not on file  Physical Activity:   . Days of Exercise per Week: Not on file  . Minutes of Exercise per Session: Not on file  Stress:   . Feeling of Stress : Not on file  Social Connections:   . Frequency of Communication with Friends and Family: Not on file  . Frequency of Social Gatherings with Friends and Family: Not on file  . Attends Religious Services: Not on file  . Active Member of Clubs or Organizations: Not on file  . Attends Archivist Meetings: Not on file  . Marital Status: Not on file  Intimate Partner Violence:   . Fear of Current or Ex-Partner: Not on file  . Emotionally Abused: Not on file  . Physically  Abused: Not on file  . Sexually Abused: Not on file    Outpatient Medications Prior to Visit  Medication Sig Dispense Refill  . meloxicam (MOBIC) 7.5 MG tablet Take 1 tablet (7.5 mg total) by mouth daily. (Patient not taking: Reported on 05/17/2020) 30 tablet 0  . methocarbamol (ROBAXIN) 500 MG tablet Take 1 tablet (500 mg total) by mouth every 8 (eight) hours as needed for muscle spasms. (Patient not taking: Reported on 05/17/2020) 40 tablet 0   No facility-administered medications prior to visit.    No Known Allergies  ROS Review of Systems  Constitutional: Negative for chills, diaphoresis, fatigue, fever and unexpected weight change.  HENT: Negative.   Eyes: Negative for photophobia and visual disturbance.  Respiratory: Positive for shortness of breath. Negative for chest tightness and wheezing.   Cardiovascular: Negative for chest pain, palpitations and leg swelling.  Gastrointestinal: Negative for diarrhea and  vomiting.  Genitourinary: Negative.   Musculoskeletal: Negative for gait problem.  Skin: Negative for pallor and rash.  Allergic/Immunologic: Negative for immunocompromised state.  Neurological: Negative for speech difficulty and headaches.  Psychiatric/Behavioral: Negative.       Objective:    Physical Exam Vitals and nursing note reviewed.  Constitutional:      General: He is not in acute distress.    Appearance: Normal appearance. He is not ill-appearing or toxic-appearing.  HENT:     Head: Atraumatic.     Right Ear: External ear normal.     Left Ear: External ear normal.     Mouth/Throat:     Mouth: Mucous membranes are moist.     Pharynx: Oropharynx is clear. No oropharyngeal exudate or posterior oropharyngeal erythema.  Eyes:     General: No scleral icterus.       Right eye: No discharge.        Left eye: No discharge.  Cardiovascular:     Rate and Rhythm: Normal rate and regular rhythm.  Pulmonary:     Effort: Pulmonary effort is normal. No respiratory distress.     Breath sounds: Normal breath sounds. No stridor. No wheezing, rhonchi or rales.  Abdominal:     General: Bowel sounds are normal.  Musculoskeletal:     Cervical back: No rigidity or tenderness.     Right lower leg: No edema.     Left lower leg: No edema.  Lymphadenopathy:     Cervical: No cervical adenopathy.  Neurological:     General: No focal deficit present.     Mental Status: He is alert and oriented to person, place, and time.  Psychiatric:        Mood and Affect: Mood normal.        Behavior: Behavior normal.     BP 120/72   Pulse 84   Temp (!) 97.1 F (36.2 C) (Tympanic)   Ht 5\' 6"  (1.676 m)   Wt 179 lb 6.4 oz (81.4 kg)   SpO2 95%   BMI 28.96 kg/m  Wt Readings from Last 3 Encounters:  06/11/20 179 lb 6.4 oz (81.4 kg)  05/17/20 177 lb (80.3 kg)  04/21/20 186 lb (84.4 kg)     Health Maintenance Due  Topic Date Due  . Hepatitis C Screening  Never done  . INFLUENZA VACCINE   03/31/2020    There are no preventive care reminders to display for this patient.  Lab Results  Component Value Date   TSH 1.450 02/23/2018   Lab Results  Component Value Date   WBC  3.5 (L) 04/21/2020   HGB 14.4 04/21/2020   HCT 45.1 04/21/2020   MCV 87.1 04/21/2020   PLT 207 04/21/2020   Lab Results  Component Value Date   NA 133 (L) 04/21/2020   K 3.8 04/21/2020   CO2 25 04/21/2020   GLUCOSE 114 (H) 04/21/2020   BUN 10 04/21/2020   CREATININE 1.01 04/21/2020   BILITOT 0.5 04/21/2020   ALKPHOS 70 04/21/2020   AST 75 (H) 04/21/2020   ALT 72 (H) 04/21/2020   PROT 7.5 04/21/2020   ALBUMIN 3.6 04/21/2020   CALCIUM 8.3 (L) 04/21/2020   ANIONGAP 10 04/21/2020   GFR 82.83 04/20/2019   Lab Results  Component Value Date   CHOL 251 (H) 04/20/2019   Lab Results  Component Value Date   HDL 50.50 04/20/2019   Lab Results  Component Value Date   LDLCALC 170 (H) 04/20/2019   Lab Results  Component Value Date   TRIG 154.0 (H) 04/20/2019   Lab Results  Component Value Date   CHOLHDL 5 04/20/2019   No results found for: HGBA1C    Assessment & Plan:   Problem List Items Addressed This Visit      Other   Elevated LFTs   Relevant Orders   CBC   Hepatic function panel   Hepatitis C antibody   Urinalysis, Routine w reflex microscopic   Hepatitis B Surface AntiGEN   DOE (dyspnea on exertion) - Primary   Relevant Orders   DG Chest 2 View   EKG 12-Lead (Completed)   CBC   Basic metabolic panel   Ambulatory referral to Pulmonology      No orders of the defined types were placed in this encounter.   Follow-up: No follow-ups on file.   Rechecking chest x-ray.  Pulmonology consultation for evaluation of DOE.  EKG has shown poor R wave progression in the lateral leads.  Patient has no history of MI.  [name], MD

## 2020-06-12 LAB — URINALYSIS, ROUTINE W REFLEX MICROSCOPIC
Bilirubin Urine: NEGATIVE
Hgb urine dipstick: NEGATIVE
Ketones, ur: NEGATIVE
Leukocytes,Ua: NEGATIVE
Nitrite: NEGATIVE
RBC / HPF: NONE SEEN (ref 0–?)
Specific Gravity, Urine: 1.02 (ref 1.000–1.030)
Total Protein, Urine: NEGATIVE
Urine Glucose: NEGATIVE
Urobilinogen, UA: 0.2 (ref 0.0–1.0)
pH: 8 (ref 5.0–8.0)

## 2020-06-12 LAB — HEPATITIS C ANTIBODY
Hepatitis C Ab: NONREACTIVE
SIGNAL TO CUT-OFF: 0.01 (ref <1.00)

## 2020-06-12 LAB — HEPATITIS B SURFACE ANTIGEN: Hepatitis B Surface Ag: NONREACTIVE

## 2020-06-14 ENCOUNTER — Ambulatory Visit: Payer: 59 | Admitting: Family Medicine

## 2020-06-20 ENCOUNTER — Ambulatory Visit (INDEPENDENT_AMBULATORY_CARE_PROVIDER_SITE_OTHER): Payer: 59 | Admitting: Pulmonary Disease

## 2020-06-20 ENCOUNTER — Encounter: Payer: Self-pay | Admitting: Pulmonary Disease

## 2020-06-20 ENCOUNTER — Other Ambulatory Visit: Payer: Self-pay

## 2020-06-20 VITALS — BP 116/80 | HR 68 | Temp 97.3°F | Ht 67.0 in | Wt 179.4 lb

## 2020-06-20 DIAGNOSIS — R0600 Dyspnea, unspecified: Secondary | ICD-10-CM | POA: Diagnosis not present

## 2020-06-20 DIAGNOSIS — R0609 Other forms of dyspnea: Secondary | ICD-10-CM

## 2020-06-20 NOTE — Progress Notes (Signed)
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 [DISCONTINUED] meloxicam (MOBIC) 7.5 MG tablet Take 1 tablet (7.5 mg total) by mouth daily. (Patient not taking: Reported on 05/17/2020)  . [DISCONTINUED] methocarbamol (ROBAXIN) 500 MG tablet Take 1 tablet (500 mg total) by mouth every 8 (eight) hours as needed for muscle spasms. (Patient not taking: Reported on 05/17/2020)   No facility-administered encounter medications on file as of 06/20/2020.    Allergies as of 06/20/2020  . (No Known Allergies)    Past Medical History:  Diagnosis Date  . Hypertension    Was told "pre-hypertension" - no meds needed    Past Surgical History:  Procedure Laterality Date  . WISDOM TOOTH EXTRACTION     age 13    Family History  Problem Relation Age of Onset  . Diabetes Mother   . Hyperlipidemia Mother   . Hypertension Mother   . Stroke Father   . Healthy Sister   . Healthy Brother   . Colon polyps Neg Hx   . Colon cancer Neg Hx   . Esophageal cancer Neg Hx   . Rectal cancer Neg Hx   . Stomach cancer Neg Hx     Social History   Socioeconomic History  . Marital status: Married    Spouse name: Not on file  . Number of  children: Not on file  . Years of education: Not on file  . Highest education level: Not on file  Occupational History  . Not on file  Tobacco Use  . Smoking status: Never Smoker  . Smokeless tobacco: Never Used  Substance and Sexual Activity  . Alcohol use: Yes    Comment: 2-3 beers 4-5 days weekly  . Drug use: No  . Sexual activity: Yes  Other Topics Concern  . Not on file  Social History Narrative  . Not on file   Social Determinants of Health   Financial Resource Strain:   . Difficulty of Paying Living Expenses: Not on file  Food Insecurity:   . Worried About Charity fundraiser in the Last Year: Not on file  . Ran Out of Food in the Last Year: Not on file  Transportation Needs:   . Lack of Transportation (Medical): Not on file  . Lack of Transportation (Non-Medical): Not on file  Physical Activity:   . Days of Exercise per Week: Not on file  . Minutes of Exercise per Session: Not on file  Stress:   . Feeling of Stress : Not on file  Social Connections:   . Frequency of Communication with Friends and Family: Not on file  . Frequency of Social Gatherings with Friends and Family: Not on file  . Attends  Religious Services: Not on file  . Active Member of Clubs or Organizations: Not on file  . Attends Archivist Meetings: Not on file  . Marital Status: Not on file  Intimate Partner Violence:   . Fear of Current or Ex-Partner: Not on file  . Emotionally Abused: Not on file  . Physically Abused: Not on file  . Sexually Abused: Not on file    Review of Systems  Respiratory: Positive for shortness of breath.     Vitals:   06/20/20 1004  BP: 116/80  Pulse: 68  Temp: (!) 97.3 F (36.3 C)  SpO2: 98%     Physical Exam Constitutional:      Appearance: Normal appearance.  HENT:     Mouth/Throat:     Mouth: Mucous membranes are moist.  Eyes:     General:        Right eye: No discharge.        Left eye: No discharge.  Cardiovascular:     Rate and  Rhythm: Normal rate and regular rhythm.     Heart sounds: No murmur heard.  No friction rub.  Pulmonary:     Effort: No respiratory distress.     Breath sounds: No stridor. No wheezing or rhonchi.  Musculoskeletal:     Cervical back: No rigidity or tenderness.  Neurological:     Mental Status: He is alert.  Psychiatric:        Mood and Affect: Mood normal.      Data Reviewed:  Chest x-ray reviewed by myself performed 04/21/2020-possible bibasilar pneumonia  06/14/2020-increased pulmonary vascular markings  Assessment:  Post Covid syndrome  Shortness of breath on exertion  Patient has no underlying lung disease  Plan/Recommendations:  With persistence of symptoms will order pulmonary function test  Repeat chest x-ray in 6 weeks  Regarding being able to go back to work-from a respiratory perspective I will say yes he may be able to go back to work however, he feels he still has significant enough limitations that he may not be able to function well at his current job -Should be able to return to work fine beginning of the month  I will follow up on chest x-ray and PFT   Sherrilyn Rist MD [facility] 06/20/2020, 10:34 AM  CC: [name],*

## 2020-06-20 NOTE — Patient Instructions (Addendum)
Shortness of breath Covid infection in August  .  Obtain breathing study .  Obtain chest x-ray in 6 weeks  .  Follow-up in 6 to 8 weeks  .  No treatment to put in place at present apart from just keep an eye on your symptoms. Your symptoms are improving, no indication for any treatment at present   May go back to work [employer]

## 2020-08-05 ENCOUNTER — Encounter (HOSPITAL_COMMUNITY): Payer: Self-pay

## 2020-08-05 ENCOUNTER — Ambulatory Visit (HOSPITAL_COMMUNITY)
Admission: EM | Admit: 2020-08-05 | Discharge: 2020-08-05 | Disposition: A | Discharge status: Home / Self Care | Payer: 59 | Attending: Internal Medicine | Admitting: Internal Medicine

## 2020-08-05 ENCOUNTER — Other Ambulatory Visit: Payer: Self-pay

## 2020-08-05 DIAGNOSIS — T148XXA Other injury of unspecified body region, initial encounter: Secondary | ICD-10-CM

## 2020-08-05 MED ORDER — IBUPROFEN 600 MG PO TABS: 600.0000 mg | ORAL_TABLET | Freq: Four times a day (QID) | ORAL | 0 refills | Status: DC | PRN

## 2020-08-05 MED ORDER — CYCLOBENZAPRINE HCL 10 MG PO TABS: 10.0000 mg | ORAL_TABLET | Freq: Two times a day (BID) | ORAL | 0 refills | Status: DC | PRN
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
## 2020-08-07 ENCOUNTER — Ambulatory Visit: Payer: 59 | Admitting: Pulmonary Disease
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
## 2020-10-04 ENCOUNTER — Telehealth: Payer: Self-pay | Admitting: Family Medicine

## 2020-10-04 NOTE — Telephone Encounter (Signed)
Pt wants to know if he can transfer care to [facility], Is this okay?

## 2020-10-05 NOTE — Telephone Encounter (Signed)
I have a new volume of new patients, so I am unable to accept transfers at this time

## 2020-10-07 NOTE — Telephone Encounter (Signed)
Pt said they are just going to find a different provider

## 2020-10-24 ENCOUNTER — Other Ambulatory Visit: Payer: Self-pay

## 2020-10-25 ENCOUNTER — Ambulatory Visit (INDEPENDENT_AMBULATORY_CARE_PROVIDER_SITE_OTHER): Payer: 59 | Admitting: Family Medicine

## 2020-10-25 ENCOUNTER — Encounter: Payer: Self-pay | Admitting: Family Medicine

## 2020-10-25 VITALS — BP 110/80 | HR 66 | Temp 96.9°F | Ht 67.0 in | Wt 179.0 lb

## 2020-10-25 DIAGNOSIS — N529 Male erectile dysfunction, unspecified: Secondary | ICD-10-CM | POA: Diagnosis not present

## 2020-10-25 DIAGNOSIS — S63502D Unspecified sprain of left wrist, subsequent encounter: Secondary | ICD-10-CM | POA: Diagnosis not present

## 2020-10-25 DIAGNOSIS — M2391 Unspecified internal derangement of right knee: Secondary | ICD-10-CM | POA: Diagnosis not present

## 2020-10-25 MED ORDER — TADALAFIL 20 MG PO TABS: 10.0000 mg | ORAL_TABLET | ORAL | 11 refills | Status: DC | PRN

## 2020-10-25 NOTE — Patient Instructions (Signed)
Health Maintenance Due  Topic Date Due  . INFLUENZA VACCINE  03/31/2020  . COVID-19 Vaccine (2 - Pfizer 3-dose series) 06/21/2020    Depression screen PHQ 2/9 02/23/2018 11/11/2017  Decreased Interest 0 0  Down, Depressed, Hopeless 0 0  PHQ - 2 Score 0 0
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
## 2020-10-30 ENCOUNTER — Telehealth: Payer: Self-pay | Admitting: Family Medicine

## 2020-10-30 DIAGNOSIS — M2391 Unspecified internal derangement of right knee: Secondary | ICD-10-CM

## 2020-10-30 NOTE — Telephone Encounter (Signed)
I called and discussed with Jared Park. He will cancel his appointment for the MRI in light of the lack of insurance authorization. I will refer him to orthopedics for their evaluation.

## 2020-10-31 ENCOUNTER — Ambulatory Visit (INDEPENDENT_AMBULATORY_CARE_PROVIDER_SITE_OTHER): Payer: 59 | Admitting: Rehabilitative and Restorative Service Providers"

## 2020-10-31 ENCOUNTER — Other Ambulatory Visit: Payer: Self-pay

## 2020-10-31 ENCOUNTER — Encounter: Payer: Self-pay | Admitting: Rehabilitative and Restorative Service Providers"

## 2020-10-31 DIAGNOSIS — M25532 Pain in left wrist: Secondary | ICD-10-CM

## 2020-10-31 DIAGNOSIS — M6281 Muscle weakness (generalized): Secondary | ICD-10-CM
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
## 2020-10-31 NOTE — Patient Instructions (Signed)
Access Code: [email or web address] URL: [email or web address] Date: 10/31/2020 Prepared by: Scot Jun  Exercises Forearm Supination with Resistance - 2 x daily - 7 x weekly - 3 sets - 10 reps Forearm Pronation with Resistance - 2 x daily - 7 x weekly - 3 sets - 10 reps Wrist Flexion with Resistance - 2 x daily - 7 x weekly - 3 sets - 10 reps Wrist Extension with Resistance - 2 x daily - 7 x weekly - 3 sets - 10 reps Seated Wrist Radial Deviation with Dumbbell - 2 x daily - 7 x weekly - 3 sets - 10 reps Seated Wrist Flexion Stretch - 2 x daily - 7 x weekly - 1 sets - 3-5 reps - 15 hold Seated Wrist Extension Stretch - 1 x daily - 7 x weekly - 1 sets - 3-5 reps - 15 hold

## 2020-11-04 ENCOUNTER — Ambulatory Visit: Payer: 59 | Admitting: Family Medicine

## 2020-11-07 ENCOUNTER — Ambulatory Visit (INDEPENDENT_AMBULATORY_CARE_PROVIDER_SITE_OTHER): Payer: 59

## 2020-11-07 ENCOUNTER — Encounter: Payer: Self-pay | Admitting: Orthopaedic Surgery

## 2020-11-07 ENCOUNTER — Ambulatory Visit (INDEPENDENT_AMBULATORY_CARE_PROVIDER_SITE_OTHER): Payer: 59 | Admitting: Orthopaedic Surgery

## 2020-11-07 DIAGNOSIS — G8929 Other chronic pain: Secondary | ICD-10-CM | POA: Diagnosis not present

## 2020-11-07 DIAGNOSIS — M25561 Pain in right knee: Secondary | ICD-10-CM | POA: Diagnosis not present

## 2020-11-07 MED ORDER — METHYLPREDNISOLONE ACETATE 40 MG/ML IJ SUSP
40.0000 mg | INTRAMUSCULAR | Status: AC | PRN
  Administered 2020-11-07: 40 mg via INTRA_ARTICULAR

## 2020-11-07 MED ORDER — BUPIVACAINE HCL 0.25 % IJ SOLN
2.0000 mL | INTRAMUSCULAR | Status: AC | PRN
  Administered 2020-11-07: 2 mL via INTRA_ARTICULAR

## 2020-11-07 MED ORDER — LIDOCAINE HCL 1 % IJ SOLN
2.0000 mL | INTRAMUSCULAR | Status: AC | PRN
  Administered 2020-11-07: 2 mL
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
## 2020-11-11 ENCOUNTER — Other Ambulatory Visit: Payer: 59

## 2020-11-12 ENCOUNTER — Other Ambulatory Visit: Payer: Self-pay

## 2020-11-12 ENCOUNTER — Encounter: Payer: Self-pay | Admitting: Rehabilitative and Restorative Service Providers"

## 2020-11-12 ENCOUNTER — Ambulatory Visit (INDEPENDENT_AMBULATORY_CARE_PROVIDER_SITE_OTHER): Payer: 59 | Admitting: Rehabilitative and Restorative Service Providers"

## 2020-11-12 DIAGNOSIS — M6281 Muscle weakness (generalized): Secondary | ICD-10-CM | POA: Diagnosis not present

## 2020-11-12 DIAGNOSIS — M25532 Pain in left wrist: Secondary | ICD-10-CM
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
## 2020-11-19 ENCOUNTER — Other Ambulatory Visit: Payer: Self-pay

## 2020-11-19 ENCOUNTER — Encounter: Payer: Self-pay | Admitting: Rehabilitative and Restorative Service Providers"

## 2020-11-19 ENCOUNTER — Ambulatory Visit (INDEPENDENT_AMBULATORY_CARE_PROVIDER_SITE_OTHER): Payer: 59 | Admitting: Rehabilitative and Restorative Service Providers"

## 2020-11-19 DIAGNOSIS — M25532 Pain in left wrist: Secondary | ICD-10-CM

## 2020-11-19 DIAGNOSIS — M6281 Muscle weakness (generalized): Secondary | ICD-10-CM
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
## 2020-11-25 ENCOUNTER — Ambulatory Visit: Payer: 59 | Admitting: Family Medicine

## 2020-11-26 ENCOUNTER — Ambulatory Visit (INDEPENDENT_AMBULATORY_CARE_PROVIDER_SITE_OTHER): Payer: 59 | Admitting: Physical Therapy

## 2020-11-26 ENCOUNTER — Other Ambulatory Visit: Payer: Self-pay

## 2020-11-26 ENCOUNTER — Encounter: Payer: Self-pay | Admitting: Physical Therapy

## 2020-11-26 DIAGNOSIS — M6281 Muscle weakness (generalized): Secondary | ICD-10-CM

## 2020-11-26 DIAGNOSIS — M25532 Pain in left wrist: Secondary | ICD-10-CM | POA: Diagnosis not present
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
## 2020-12-03 ENCOUNTER — Ambulatory Visit (INDEPENDENT_AMBULATORY_CARE_PROVIDER_SITE_OTHER): Payer: 59 | Admitting: Physical Therapy

## 2020-12-03 ENCOUNTER — Other Ambulatory Visit: Payer: Self-pay

## 2020-12-03 ENCOUNTER — Encounter: Payer: Self-pay | Admitting: Physical Therapy

## 2020-12-03 DIAGNOSIS — M25532 Pain in left wrist: Secondary | ICD-10-CM | POA: Diagnosis not present

## 2020-12-03 DIAGNOSIS — M6281 Muscle weakness (generalized): Secondary | ICD-10-CM
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
## 2020-12-31 ENCOUNTER — Ambulatory Visit (INDEPENDENT_AMBULATORY_CARE_PROVIDER_SITE_OTHER): Payer: 59 | Admitting: Orthopaedic Surgery

## 2020-12-31 ENCOUNTER — Other Ambulatory Visit: Payer: Self-pay

## 2020-12-31 ENCOUNTER — Encounter: Payer: Self-pay | Admitting: Orthopaedic Surgery

## 2020-12-31 DIAGNOSIS — G8929 Other chronic pain: Secondary | ICD-10-CM | POA: Diagnosis not present

## 2020-12-31 DIAGNOSIS — M25561 Pain in right knee: Secondary | ICD-10-CM | POA: Diagnosis not present

## 2020-12-31 NOTE — Progress Notes (Signed)
   Office Visit Note   Patient: Jared Park.           Date of Birth: 12/02/1965           MRN: [id number] Visit Date: 12/31/2020              Requested by: Jared Maw, MD [address] [place],  [place] [address] PCP: Jared Maw, MD   Assessment & Plan: Visit Diagnoses:  1. Chronic pain of right knee     Plan: Based on continued symptoms despite cortisone injection we will need to obtain MRI to rule out structural abnormalities mainly medial meniscal tear.  Follow-up after the MRI.  Follow-Up Instructions: Return for Follow-up after MRI..   Orders:  No orders of the defined types were placed in this encounter.  No orders of the defined types were placed in this encounter.     Procedures: No procedures performed   Clinical Data: No additional findings.   Subjective: Chief Complaint  Patient presents with  . Right Knee - Pain, Follow-up    [name] is following up today for continued right knee pain.  We saw him about 2 months ago and administered a cortisone injection.  Unfortunately the injection only helped about 2 weeks.  He continues to have pain on the medial side with mechanical symptoms.  He has also has been wearing a knee brace which has not helped.  He continues to take Motrin fairly regularly.   Review of Systems   Objective: Vital Signs: There were no vitals taken for this visit.  Physical Exam  Ortho Exam Right knee exam is unchanged. Specialty Comments:  No specialty comments available.  Imaging: No results found.   PMFS History: Patient Active Problem List   Diagnosis Date Noted  . Erectile dysfunction 10/25/2020  . Elevated LFTs 06/11/2020  . DOE (dyspnea on exertion) 06/11/2020  . Elevated LDL cholesterol level 03/12/2020  . Cervical strain 03/12/2020  . COMMON MIGRAINE 05/17/2007   Past Medical History:  Diagnosis Date  . Hypertension    Was told "pre-hypertension" - no meds needed    Family  History  Problem Relation Age of Onset  . Diabetes Mother   . Hyperlipidemia Mother   . Hypertension Mother   . Stroke Father   . Healthy Sister   . Healthy Brother   . Colon polyps Neg Hx   . Colon cancer Neg Hx   . Esophageal cancer Neg Hx   . Rectal cancer Neg Hx   . Stomach cancer Neg Hx     Past Surgical History:  Procedure Laterality Date  . WISDOM TOOTH EXTRACTION     age 62   Social History   Occupational History  . Not on file  Tobacco Use  . Smoking status: Never Smoker  . Smokeless tobacco: Never Used  Substance and Sexual Activity  . Alcohol use: Yes    Comment: 2-3 beers 4-5 days weekly  . Drug use: No  . Sexual activity: Yes

## 2020-12-31 NOTE — Addendum Note (Signed)
Addended by: Precious Bard on: 12/31/2020 03:11 PM   Modules accepted: Orders

## 2021-01-06 ENCOUNTER — Telehealth: Payer: Self-pay | Admitting: Orthopaedic Surgery

## 2021-01-06 ENCOUNTER — Other Ambulatory Visit: Payer: Self-pay

## 2021-01-06 DIAGNOSIS — M25532 Pain in left wrist: Secondary | ICD-10-CM

## 2021-01-06 NOTE — Telephone Encounter (Signed)
Order made

## 2021-01-06 NOTE — Telephone Encounter (Signed)
Ok MR arthrogram

## 2021-01-06 NOTE — Telephone Encounter (Signed)
Can this be added?

## 2021-01-06 NOTE — Telephone Encounter (Signed)
Patient called requesting that left wrist be added to MRI on next week appt. Please call patient about this matter at

## 2021-01-07 NOTE — Telephone Encounter (Signed)
Order has been sent to [facility] scheduling where they will contact pt to scheudle appt

## 2021-01-07 NOTE — Telephone Encounter (Signed)
It can be added but not sure if he can have it done same time as his appt for next week. He will have to call to reschedule to get both done

## 2021-01-08 ENCOUNTER — Telehealth: Payer: Self-pay

## 2021-01-08 ENCOUNTER — Telehealth: Payer: Self-pay | Admitting: Orthopaedic Surgery

## 2021-01-08 NOTE — Telephone Encounter (Signed)
Patient called back to set MRI. He states he iwll call back and set MRI review with Dr. Erlinda Hong after 5/24. Will wait for patient's call

## 2021-01-13 ENCOUNTER — Other Ambulatory Visit: Payer: Self-pay

## 2021-01-13 ENCOUNTER — Ambulatory Visit (HOSPITAL_COMMUNITY)
Admission: RE | Admit: 2021-01-13 | Discharge: 2021-01-13 | Disposition: A | Discharge status: Home / Self Care | Payer: 59 | Source: Ambulatory Visit | Attending: Orthopaedic Surgery | Admitting: Orthopaedic Surgery

## 2021-01-13 DIAGNOSIS — M25561 Pain in right knee: Secondary | ICD-10-CM | POA: Insufficient documentation

## 2021-01-13 DIAGNOSIS — G8929 Other chronic pain: Secondary | ICD-10-CM | POA: Diagnosis present

## 2021-01-13 IMAGING — MR MR KNEE*R* W/O CM
6 of 10 series · 22 of 40 positions shown · non-contrast
Comparison: X-ray [DATE]

CLINICAL DATA: Chronic right knee pain.  No known injury.

EXAM:
MRI OF THE RIGHT KNEE WITHOUT CONTRAST
TECHNIQUE: Multiplanar, multisequence MR imaging of the knee was performed. No
intravenous contrast was administered.

[Series 5: T2 fat-sat · axial · right · 4.0mm · 0.50mm/px · z∈[-87,+53]mm · 4 of 33 slices shown (1 of 3)]
[im 1/33]
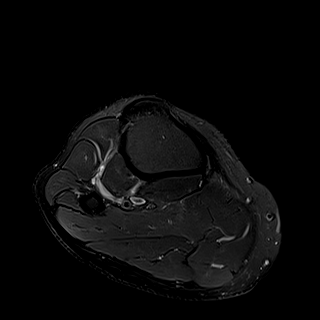
[im 11/33]
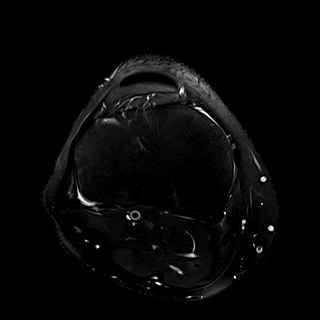
[im 22/33]
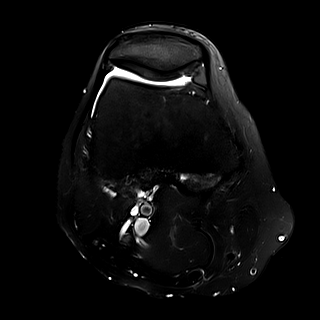
[im 33/33]
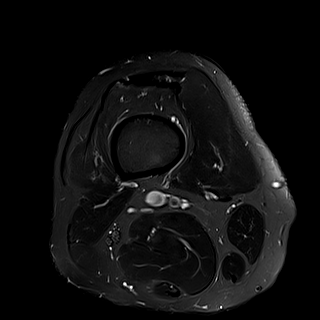

[Series 6: T1 · coronal · right · 4.0mm · 0.33mm/px · 4 of 30 slices shown]
[im 1/30]
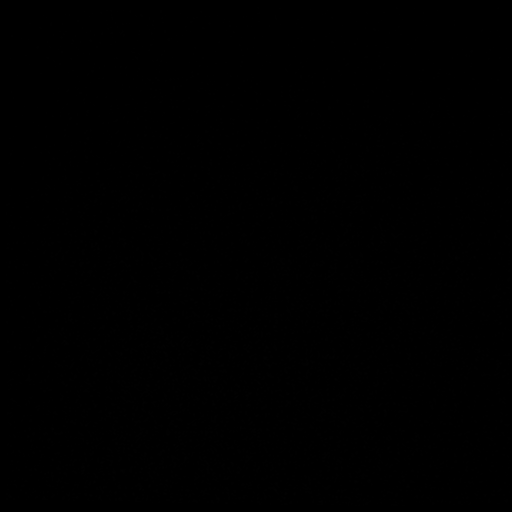
[im 10/30]
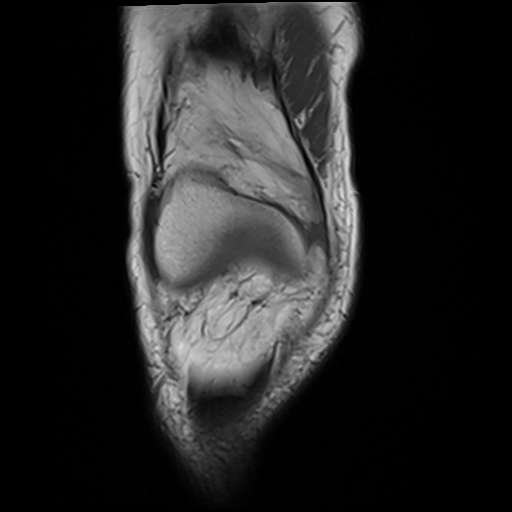
[im 20/30]
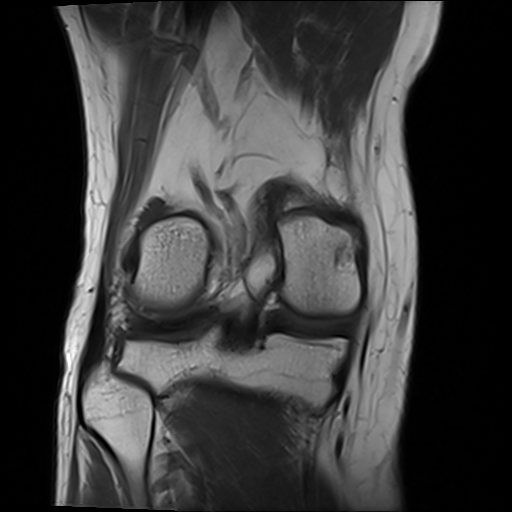
[im 30/30]
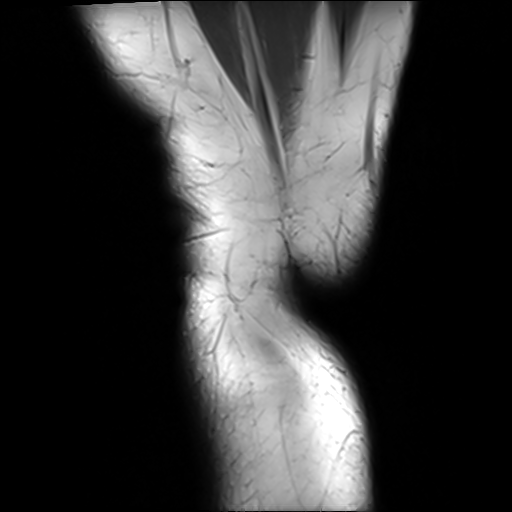

[Series 7: T2 fat-sat · coronal · right · 4.0mm · 0.59mm/px · 4 of 30 slices shown (2 of 3)]
[im 1/30]
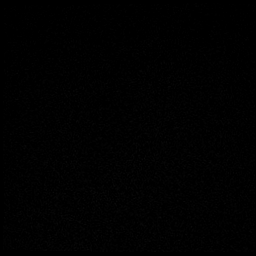
[im 10/30]
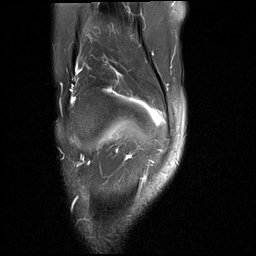
[im 20/30]
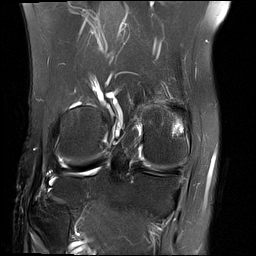
[im 30/30]
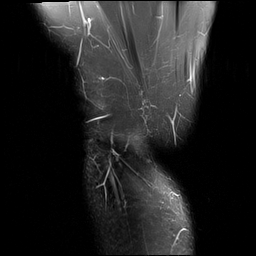

[Series 8: T2 fat-sat · sagittal · right · 4.0mm · 0.53mm/px · 3 of 25 slices shown (3 of 3)]
[im 1/25]
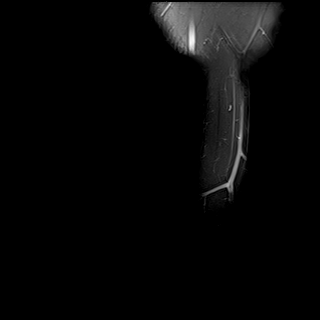
[im 13/25]
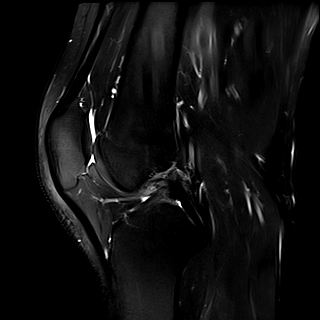
[im 25/25]
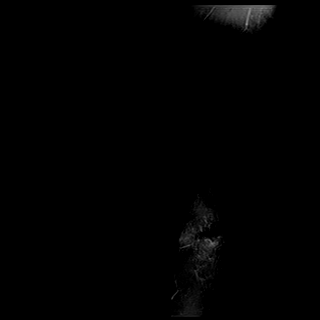

[Series 9: PD fat-sat · coronal · right · 3.0mm · 0.53mm/px · 4 of 32 slices shown (1 of 2)]
[im 1/32]
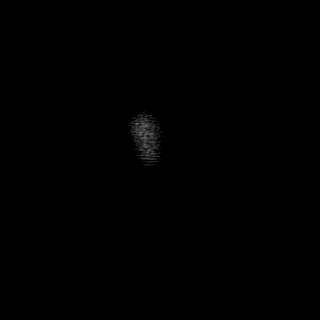
[im 11/32]
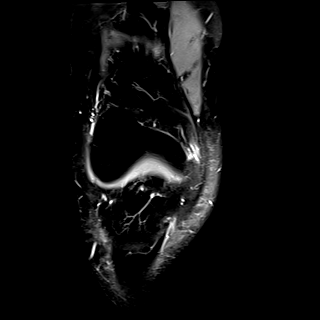
[im 21/32]
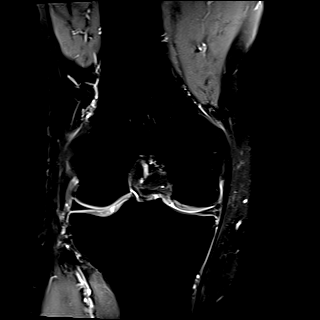
[im 32/32]
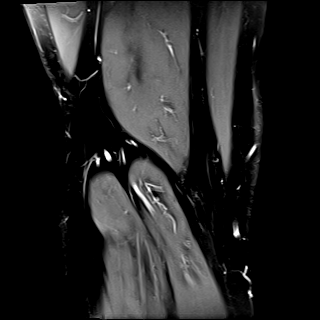

[Series 10: PD fat-sat · sagittal · right · 4.0mm · 0.53mm/px · 3 of 25 slices shown (2 of 2)]
[im 1/25]
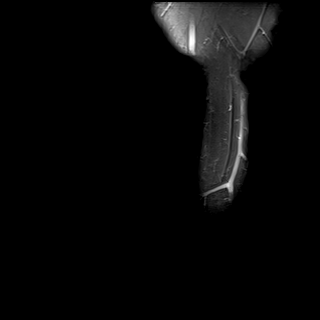
[im 13/25]
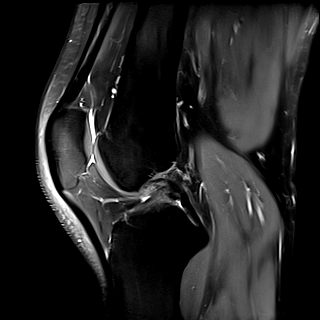
[im 25/25]
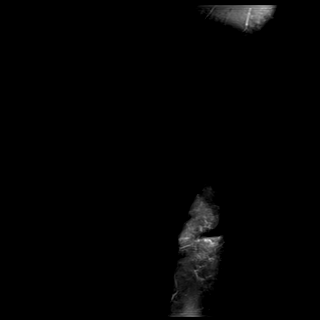

[22 of 40 positions shown; findings below may reference images not displayed]

FINDINGS: MENISCI

Medial meniscus: Complex tearing of the medial meniscal body and
posterior horn with irregular vertical component of the posterior
horn and horizontal-oblique component of the body which extends into
the posterior horn (series 10, images 7-10).

Lateral meniscus:  Intact.

LIGAMENTS

Cruciates:  Intact ACL and PCL.

Collaterals: Proximal MCL is thickened with intermediate
intrasubstance signal. No tear or periligamentous edema. Lateral
collateral ligament complex intact.

CARTILAGE

Patellofemoral: Partial-thickness chondral surface ulceration of the
medial patellar facet. No trochlear chondral defect.

Medial: Focal full-thickness cartilage fissure of the posterior
nonweightbearing medial femoral condyle with underlying subchondral
cystic change (series 5, image 16).

Lateral:  No chondral defect.

Joint:  Trace knee joint effusion.  Fat pads within normal limits.

Popliteal Fossa:  No Baker cyst. Intact popliteus tendon.

Extensor Mechanism:  Intact quadriceps tendon and patellar tendon.

Bones: Subchondral cyst formation in the posterior medial femoral
condyle. No fracture. No malalignment. No suspicious bone lesion.

Other: No soft tissue edema or fluid collection. Normal muscle bulk
and signal intensity.
IMPRESSION: 1. Complex tearing of the medial meniscal body and posterior horn.
2. Findings suggest subacute to chronic MCL sprain.
3. Focal chondral irregularities of the patella and medial femoral
condyle, as above.
4. Trace knee joint effusion.

## 2021-01-14 NOTE — Progress Notes (Signed)
Needs appt

## 2021-01-21 ENCOUNTER — Ambulatory Visit
Admission: RE | Admit: 2021-01-21 | Discharge: 2021-01-21 | Disposition: A | Discharge status: Home / Self Care | Payer: 59 | Source: Ambulatory Visit | Attending: Orthopaedic Surgery | Admitting: Orthopaedic Surgery

## 2021-01-21 DIAGNOSIS — M25532 Pain in left wrist: Secondary | ICD-10-CM
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
## 2021-01-21 IMAGING — MR MR WRIST*L* W/CM
6 series · 40 of 40 positions shown · IV contrast (agent unspecified)
Comparison: None.

CLINICAL DATA: Wrist pain, chronic, osteoarthritis suspected

EXAM:
MRI OF THE LEFT WRIST WITH CONTRAST (MR Arthrogram)
TECHNIQUE: Multiplanar, multisequence MR imaging of the wrist was performed
immediately following contrast injection into the radiocarpal joint
under fluoroscopic guidance. No intravenous contrast was
administered.

[Series 3: T1 fat-sat · oblique · left · 3.0mm · 0.31mm/px · 8 of 22 slices shown (1 of 2)]
[im 1/22]
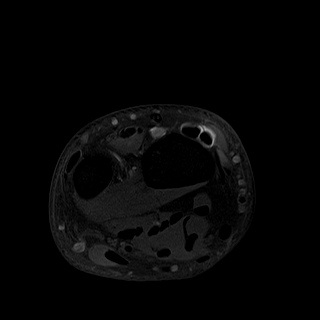
[im 4/22]
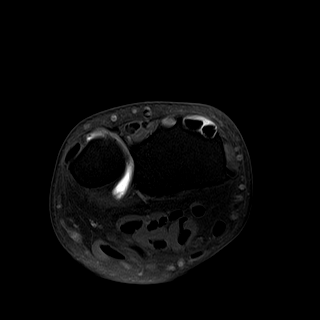
[im 7/22]
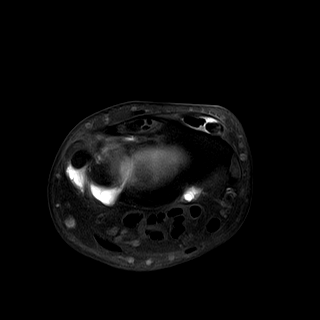
[im 10/22]
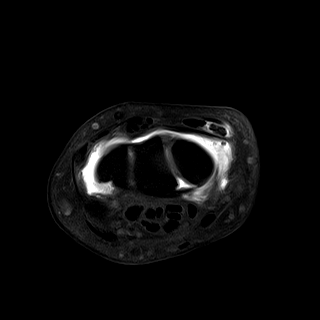
[im 13/22]
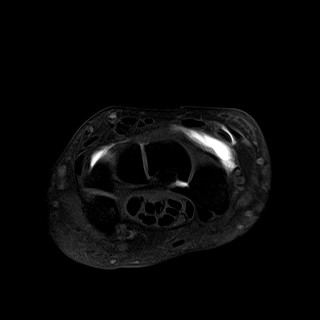
[im 16/22]
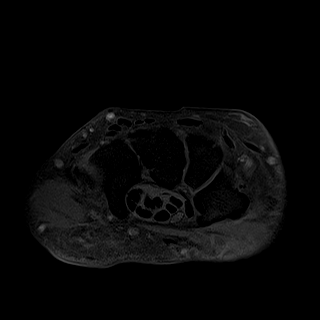
[im 19/22]
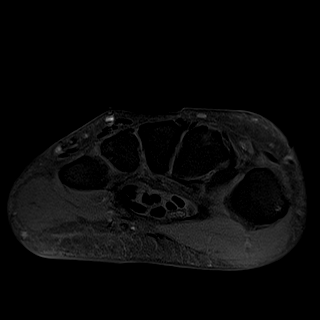
[im 22/22]
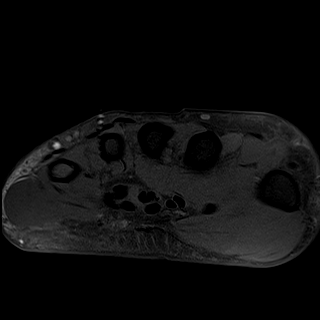

[Series 4: T2 fat-sat · oblique · left · 3.0mm · 0.31mm/px · 8 of 22 slices shown (1 of 3)]
[im 1/22]
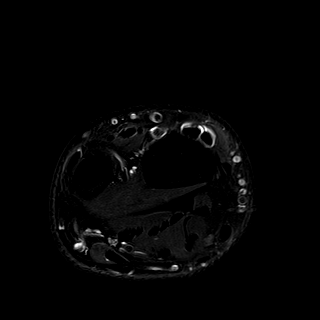
[im 4/22]
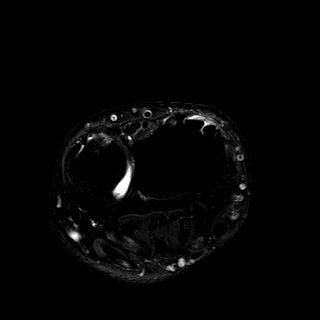
[im 7/22]
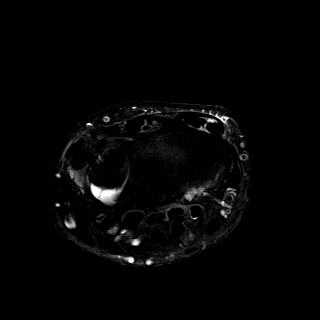
[im 10/22]
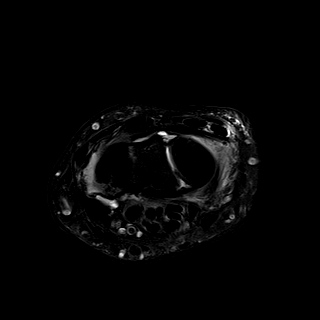
[im 13/22]
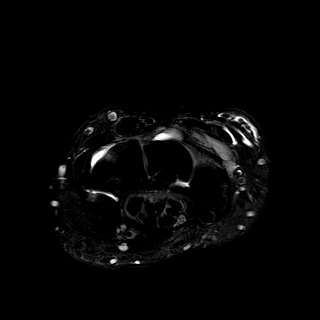
[im 16/22]
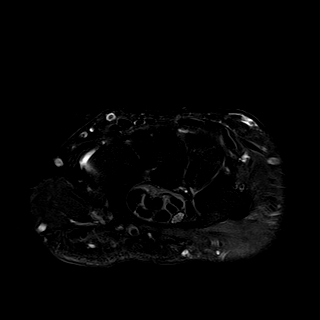
[im 19/22]
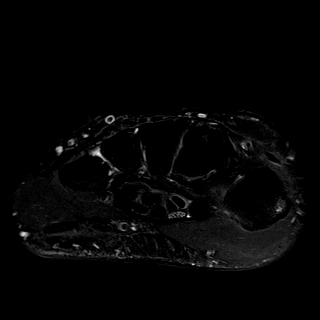
[im 22/22]
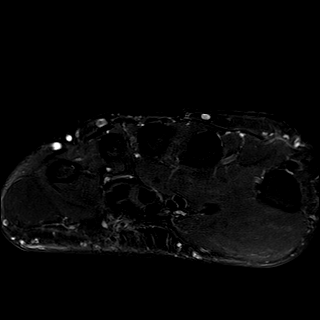

[Series 5: T1 fat-sat · coronal · left · 3.0mm · 0.31mm/px · 5 of 16 slices shown (2 of 2)]
[im 1/16]
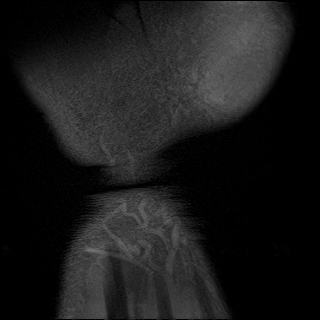
[im 4/16]
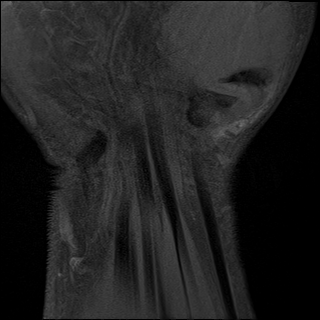
[im 8/16]
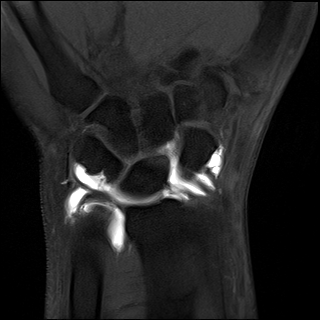
[im 12/16]
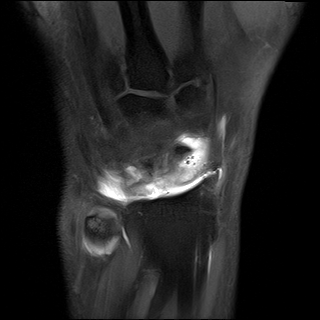
[im 16/16]
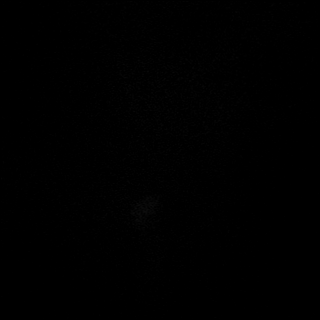

[Series 6: T1 · coronal · left · 3.0mm · 0.31mm/px · 5 of 16 slices shown]
[im 1/16]
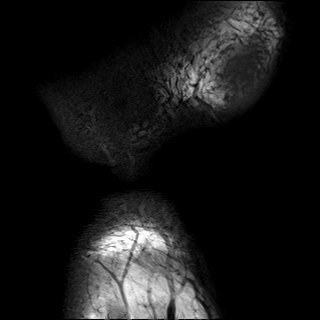
[im 4/16]
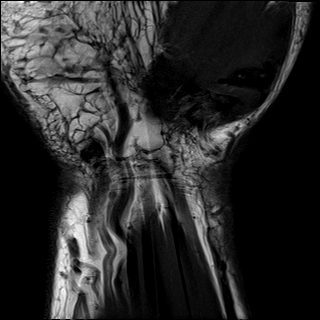
[im 8/16]
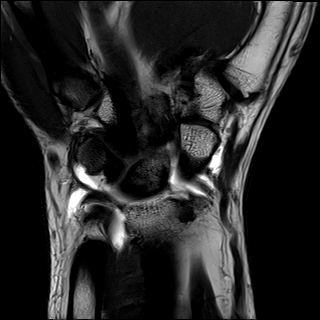
[im 12/16]
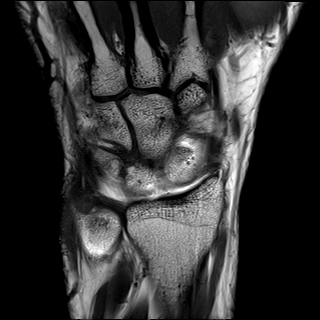
[im 16/16]
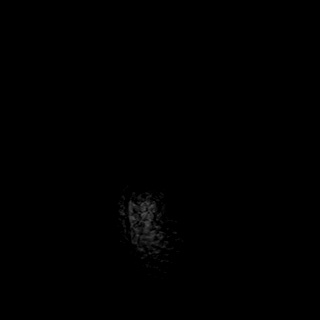

[Series 7: T2 fat-sat · coronal · left · 3.0mm · 0.31mm/px · 5 of 16 slices shown (2 of 3)]
[im 1/16]
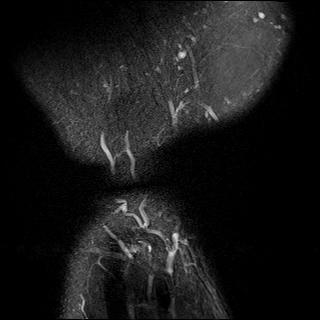
[im 4/16]
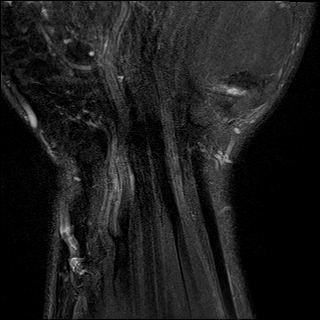
[im 8/16]
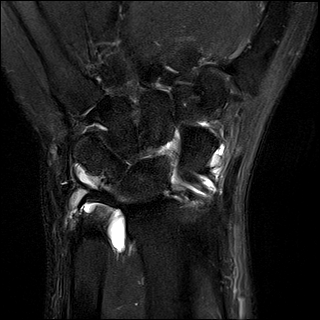
[im 12/16]
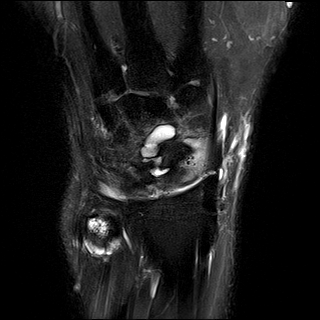
[im 16/16]
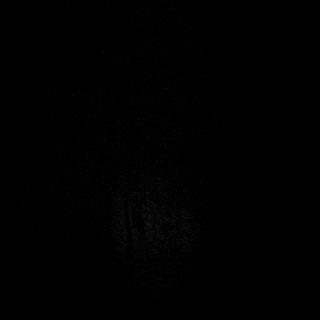

[Series 8: T2 fat-sat · oblique · left · 3.0mm · 0.31mm/px · 9 of 25 slices shown (3 of 3)]
[im 1/25]
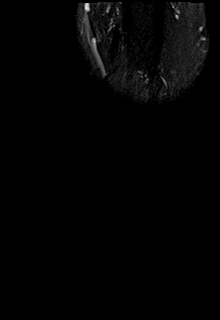
[im 4/25]
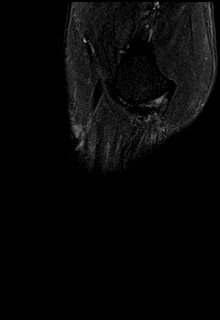
[im 7/25]
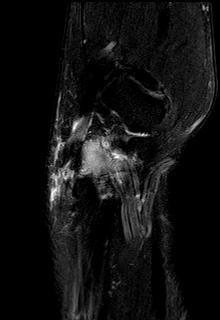
[im 10/25]
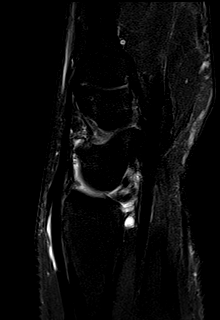
[im 13/25]
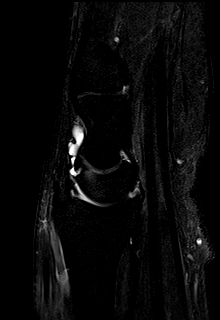
[im 16/25]
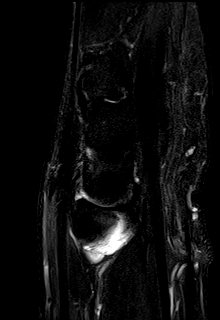
[im 19/25]
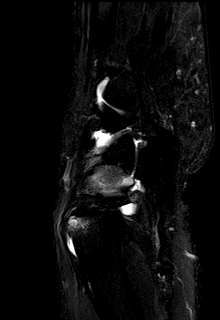
[im 22/25]
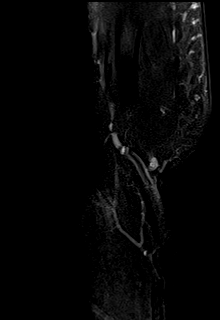
[im 25/25]
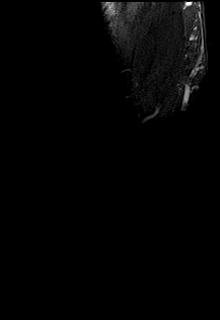

[40 of 40 positions shown; findings below may reference images not displayed]

FINDINGS: Ligaments: The scapholunate ligament is intact. There is linear
signal extending into the lunotriquetral interval, but no
significant spillage of contrast into the intercarpal joints. The
lunotriquetral ligament is therefore likely intact.

Triangular fibrocartilage: There is spillage of intra-articular
contrast into the distal radioulnar joint. Significant thinning of
the central TFCC articular disc, while no definitive defect is seen,
there is likely central perforation allowing contrast spillage into
the DRUJ.

Tendons: Flexor and extensor compartment tendons are intact without
evidence of tendon tear. There is fluid within the second and third
extensor compartments likely from injection technique.

Carpal tunnel/median nerve: Flexor retinaculum is intact. Normal
carpal tunnel without a mass. Median nerve demonstrates normal
signal and caliber.

Guyon's canal: Normal Guyon's canal. Normal ulnar nerve.

Joint/cartilage: No chondral defect. No joint effusion.

Bones/carpal alignment: No acute fracture. Nonspecific bony edema
along the dorsal aspect of the distal ulna.

Other: Muscles are normal. There is a dorsal ganglion cyst along the
lunate and capitate measuring 1.6 by 1.0 x 3.4 cm (sagittal T2 image
13, axial T2 image 10). Additional tiny ganglion along the volar
aspect of the distal radius (axial T2 image 17).
IMPRESSION: Spillage of intra-articular contrast into the distal radioulnar
joint, suggesting TFCC tear. While no definitive defect is
visualized, there is thinning of the TFCC articular disc and a
central perforation is likely.

Intact scapholunate and lunotriquetral ligaments.

Focal mild bony edema along the dorsal aspect of the distal ulna,
likely reactive.

Dorsal ganglion cyst along the lunate and capitate measuring 1.6 x
1.0 x 3.4 cm.

## 2021-01-21 MED ORDER — IOPAMIDOL (ISOVUE-M 200) INJECTION 41%
2.0000 mL | Freq: Once | INTRAMUSCULAR | Status: AC
  Administered 2021-01-21: 2 mL via INTRA_ARTICULAR

## 2021-01-24 ENCOUNTER — Other Ambulatory Visit: Payer: Self-pay

## 2021-01-24 ENCOUNTER — Encounter: Payer: Self-pay | Admitting: Orthopaedic Surgery

## 2021-01-24 ENCOUNTER — Ambulatory Visit (INDEPENDENT_AMBULATORY_CARE_PROVIDER_SITE_OTHER): Payer: 59 | Admitting: Orthopaedic Surgery

## 2021-01-24 ENCOUNTER — Ambulatory Visit: Payer: Self-pay

## 2021-01-24 VITALS — Ht 67.0 in | Wt 179.0 lb

## 2021-01-24 DIAGNOSIS — S6982XA Other specified injuries of left wrist, hand and finger(s), initial encounter: Secondary | ICD-10-CM

## 2021-01-24 DIAGNOSIS — S83241A Other tear of medial meniscus, current injury, right knee, initial encounter: Secondary | ICD-10-CM | POA: Diagnosis not present
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
## 2021-01-24 NOTE — Progress Notes (Signed)
Office Visit Note   Patient: Jared Park.           Date of Birth: [birth year]           MRN: [id number] Visit Date: 01/24/2021              Requested by: Jared Maw, MD [address] [place],  [place] [address] PCP: Jared Maw, MD   Assessment & Plan: Visit Diagnoses:  1. Acute medial meniscus tear of right knee, initial encounter   2. Injury of triangular fibrocartilage complex (TFCC) of left wrist, initial encounter     Plan: MRI of the right knee shows a complex tear of the posterior horn medial meniscus.  He does have some focal areas of advanced chondromalacia but findings are consistent with a symptomatic medial meniscal tear.  I recommended arthroscopic partial medial meniscectomy and chondroplasty and debridement as indicated.  Risk benefits rehab recovery reviewed with the patient in detail.  Jared Park will call the patient in the near future to schedule surgery.  For the left wrist it appears that he has a TFCC tear and have recommended an ultrasound-guided injection which he agreed to undergo today.  Dr. Junius Park performed this injection fluoresce under ultrasound.  We will see him back as needed for the left wrist.  Follow-Up Instructions: No follow-ups on file.   Orders:  Orders Placed This Encounter  Procedures  . US Guided Needle Placement - No Linked Charges   No orders of the defined types were placed in this encounter.     Procedures: No procedures performed   Clinical Data: No additional findings.   Subjective: Chief Complaint  Patient presents with  . Right Knee - Follow-up    MRI review    Patient returns today for MRI review of the right knee and left wrist.  The right knee continues to be symptomatic with activity and standing with mechanical symptoms.  He has constant pain and swelling in the left wrist since he had the injury.   Review of Systems  Constitutional: Negative.   All other systems reviewed and are  negative.    Objective: Vital Signs: Ht 5\' 7"  (1.702 m)   Wt 179 lb (81.2 kg)   BMI 28.04 kg/m   Physical Exam Vitals and nursing note reviewed.  Constitutional:      Appearance: He is well-developed.  Pulmonary:     Effort: Pulmonary effort is normal.  Abdominal:     Palpations: Abdomen is soft.  Skin:    General: Skin is warm.  Neurological:     Mental Status: He is alert and oriented to person, place, and time.  Psychiatric:        Behavior: Behavior normal.        Thought Content: Thought content normal.        Judgment: Judgment normal.     Ortho Exam Right knee exam shows medial joint line tenderness.  Pain with McMurray testing.  Pain on the medial side of the knee with increased flexion past 90 degrees.  Left wrist exam is unchanged. Specialty Comments:  No specialty comments available.  Imaging: US Guided Needle Placement - No Linked Charges  Result Date: 01/24/2021 Ultrasound-guided left wrist injection: After sterile prep with Betadine, injected 1 cc 1% lidocaine without epinephrine and 6 mg betamethasone.     PMFS History: Patient Active Problem List   Diagnosis Date Noted  . Erectile dysfunction 10/25/2020  . Elevated LFTs 06/11/2020  .  DOE (dyspnea on exertion) 06/11/2020  . Elevated LDL cholesterol level 03/12/2020  . Cervical strain 03/12/2020  . COMMON MIGRAINE 05/17/2007   Past Medical History:  Diagnosis Date  . Hypertension    Was told "pre-hypertension" - no meds needed    Family History  Problem Relation Age of Onset  . Diabetes Mother   . Hyperlipidemia Mother   . Hypertension Mother   . Stroke Father   . Healthy Sister   . Healthy Brother   . Colon polyps Neg Hx   . Colon cancer Neg Hx   . Esophageal cancer Neg Hx   . Rectal cancer Neg Hx   . Stomach cancer Neg Hx     Past Surgical History:  Procedure Laterality Date  . WISDOM TOOTH EXTRACTION     age 67   Social History   Occupational History  . Not on file   Tobacco Use  . Smoking status: Never Smoker  . Smokeless tobacco: Never Used  Substance and Sexual Activity  . Alcohol use: Yes    Comment: 2-3 beers 4-5 days weekly  . Drug use: No  . Sexual activity: Yes

## 2021-01-28 ENCOUNTER — Encounter (HOSPITAL_BASED_OUTPATIENT_CLINIC_OR_DEPARTMENT_OTHER): Payer: Self-pay | Admitting: Orthopaedic Surgery

## 2021-01-28 ENCOUNTER — Other Ambulatory Visit: Payer: Self-pay

## 2021-02-03 ENCOUNTER — Telehealth: Payer: Self-pay

## 2021-02-03 NOTE — Telephone Encounter (Signed)
Noted  

## 2021-02-03 NOTE — Telephone Encounter (Signed)
Patient surgery that is scheduled for Wednesday 6/8 was denied by [facility]  Rationale:no documentation of tried and failed appropriate conservative treatment consisting of 3 months of options including analgesics or prescription anti-inflammatory strength meds, or PT or home directed exercises.   I called [facility] and scheduled a P2P for tomorrow at 1:00. Gave your cell # and the triage #.  Case #:[id number]

## 2021-02-04 ENCOUNTER — Telehealth: Payer: Self-pay

## 2021-02-04 NOTE — Telephone Encounter (Signed)
FYI  Peer to Peer  scheduled today 1 PM

## 2021-02-04 NOTE — Telephone Encounter (Signed)
Peer to peer for tomorrow. See message below.

## 2021-02-04 NOTE — Telephone Encounter (Signed)
Peer to peer is now set up for tomorrow morning with the [facility]. Gave clinic times and Dr. Phoebe Sharps cell # and triage #. Jackelyn Poling has advised pt and cancelled him for tomorrow. If they approve him tomorrow after the P2P she will reschedule him at that time.

## 2021-02-05 ENCOUNTER — Ambulatory Visit (HOSPITAL_BASED_OUTPATIENT_CLINIC_OR_DEPARTMENT_OTHER): Admission: RE | Admit: 2021-02-05 | Payer: 59 | Source: Home / Self Care | Admitting: Orthopaedic Surgery

## 2021-02-05 HISTORY — DX: Other tear of medial meniscus, current injury, unspecified knee, initial encounter: S83.249A

## 2021-02-05 SURGERY — ARTHROSCOPY, KNEE, WITH MEDIAL MENISCECTOMY
Anesthesia: General | Site: Knee | Laterality: Right

## 2021-02-10 ENCOUNTER — Other Ambulatory Visit: Payer: Self-pay

## 2021-02-12 ENCOUNTER — Encounter: Payer: 59 | Admitting: Orthopaedic Surgery

## 2021-02-12 NOTE — Progress Notes (Signed)
Surgical Instructions    Your procedure is scheduled on Wednesday June 22nd.  Report to [facility] Main Entrance "A" at 12:30 P.M., then check in with the Admitting office.  Call this number if you have problems the morning of surgery:  [phone]   If you have any questions prior to your surgery date call [phone]: Open Monday-Friday 8am-4pm    Remember:  Do not eat after midnight the night before your surgery  You may drink clear liquids until 11:30am the morning of your surgery.   Clear liquids allowed are: Water, Non-Citrus Juices (without pulp), Carbonated Beverages, Clear Tea, Black Coffee Only, and Gatorade   Enhanced Recovery after Surgery for Orthopedics Enhanced Recovery after Surgery is a protocol used to improve the stress on your body and your recovery after surgery.  Patient Instructions  The day of surgery (if you do NOT have diabetes):  Drink ONE (1) Pre-Surgery Clear Ensure by __11:30___ am the morning of surgery   This drink was given to you during your hospital  pre-op appointment visit. Nothing else to drink after completing the  Pre-Surgery Clear Ensure.         If you have questions, please contact your surgeon's office.     Take these medicines the morning of surgery with A SIP OF WATER if needed acetaminophen (TYLENOL) 500 MG tablet   As of today, STOP taking any Aspirin (unless otherwise instructed by your surgeon) Aleve, Naproxen, Ibuprofen, Motrin, Advil, Goody's, BC's, all herbal medications, fish oil, and all vitamins.          Do not wear jewelry  Do not wear lotions, powders, colognes, or deodorant. Do not shave 48 hours prior to surgery.  Men may shave face and neck. Do not bring valuables to the hospital. DO Not wear nail polish, gel polish, artificial nails, or any other type of covering on  natural nails including finger and toenails. If patients have artificial nails, gel coating, etc. that need to be removed by a nail salon please  have this removed prior to surgery or surgery may need to be canceled/delayed if the surgeon/ anesthesia feels like the patient is unable to be adequately monitored.             [facility] is not responsible for any belongings or valuables.  Do NOT Smoke (Tobacco/Vaping) or drink Alcohol 24 hours prior to your procedure If you use a CPAP at night, you may bring all equipment for your overnight stay.   Contacts, glasses, dentures or bridgework may not be worn into surgery, please bring cases for these belongings   For patients admitted to the hospital, discharge time will be determined by your treatment team.   Patients discharged the day of surgery will not be allowed to drive home, and someone needs to stay with them for 24 hours.  ONLY 1 SUPPORT PERSON MAY BE PRESENT WHILE YOU ARE IN SURGERY. IF YOU ARE TO BE ADMITTED ONCE YOU ARE IN YOUR ROOM YOU WILL BE ALLOWED TWO (2) VISITORS.  Minor children may have two parents present. Special consideration for safety and communication needs will be reviewed on a case by case basis.  Special instructions:    Oral Hygiene is also important to reduce your risk of infection.  Remember - BRUSH YOUR TEETH THE MORNING OF SURGERY WITH YOUR REGULAR TOOTHPASTE   [facility]- Preparing For Surgery  Before surgery, you can play an important role. Because skin is not sterile, your skin needs to  be as free of germs as possible. You can reduce the number of germs on your skin by washing with CHG (chlorahexidine gluconate) Soap before surgery.  CHG is an antiseptic cleaner which kills germs and bonds with the skin to continue killing germs even after washing.     Please do not use if you have an allergy to CHG or antibacterial soaps. If your skin becomes reddened/irritated stop using the CHG.  Do not shave (including legs and underarms) for at least 48 hours prior to first CHG shower. It is OK to shave your face.  Please follow these instructions  carefully.     Shower the NIGHT BEFORE SURGERY and the MORNING OF SURGERY with CHG Soap.   If you chose to wash your hair, wash your hair first as usual with your normal shampoo. After you shampoo, rinse your hair and body thoroughly to remove the shampoo.  Then [employer] and genitals (private parts) with your normal soap and rinse thoroughly to remove soap.  After that Use CHG Soap as you would any other liquid soap. You can apply CHG directly to the skin and wash gently with a scrungie or a clean washcloth.   Apply the CHG Soap to your body ONLY FROM THE NECK DOWN.  Do not use on open wounds or open sores. Avoid contact with your eyes, ears, mouth and genitals (private parts). Wash Face and genitals (private parts)  with your normal soap.   Wash thoroughly, paying special attention to the area where your surgery will be performed.  Thoroughly rinse your body with warm water from the neck down.  DO NOT shower/wash with your normal soap after using and rinsing off the CHG Soap.  Pat yourself dry with a CLEAN TOWEL.  Wear CLEAN PAJAMAS to bed the night before surgery  Place CLEAN SHEETS on your bed the night before your surgery  DO NOT SLEEP WITH PETS.   Day of Surgery:  Take a shower with CHG soap. Wear Clean/Comfortable clothing the morning of surgery Do not apply any deodorants/lotions.   Remember to brush your teeth WITH YOUR REGULAR TOOTHPASTE.   Please read over the following fact sheets that you were given.

## 2021-02-13 ENCOUNTER — Other Ambulatory Visit: Payer: Self-pay

## 2021-02-13 ENCOUNTER — Encounter (HOSPITAL_COMMUNITY)
Admission: RE | Admit: 2021-02-13 | Discharge: 2021-02-13 | Disposition: A | Discharge status: Home / Self Care | Payer: 59 | Source: Ambulatory Visit | Attending: Orthopaedic Surgery | Admitting: Orthopaedic Surgery

## 2021-02-13 ENCOUNTER — Encounter (HOSPITAL_COMMUNITY): Payer: Self-pay

## 2021-02-13 DIAGNOSIS — Z01812 Encounter for preprocedural laboratory examination: Secondary | ICD-10-CM | POA: Insufficient documentation

## 2021-02-13 LAB — CBC
HCT: 44.7 % (ref 39.0–52.0)
Hemoglobin: 13.8 g/dL (ref 13.0–17.0)
MCH: 27.2 pg (ref 26.0–34.0)
MCHC: 30.9 g/dL (ref 30.0–36.0)
MCV: 88.2 fL (ref 80.0–100.0)
Platelets: 330 10*3/uL (ref 150–400)
RBC: 5.07 MIL/uL (ref 4.22–5.81)
RDW: 13.2 % (ref 11.5–15.5)
WBC: 4 10*3/uL (ref 4.0–10.5)
nRBC: 0 % (ref 0.0–0.2)

## 2021-02-13 MED ORDER — LACTATED RINGERS IV SOLN: INTRAVENOUS | Status: DC

## 2021-02-13 NOTE — Progress Notes (Signed)
PCP - Abelino Derrick Cardiologist - Denies  Chest x-ray - Not indicated EKG - Not indicated Stress Test - Denies ECHO - Denies Cardiac Cath - Denies  Sleep Study - Denies  DM - Denies  ERAS Protcol - Yes PRE-SURGERY Ensure given   Anesthesia review: No  Patient denies shortness of breath, fever, cough and chest pain at PAT appointment   All instructions explained to the patient, with a verbal understanding of the material. Patient agrees to go over the instructions while at home for a better understanding. The opportunity to ask questions was provided.

## 2021-02-19 ENCOUNTER — Ambulatory Visit (HOSPITAL_COMMUNITY): Payer: 59 | Admitting: Certified Registered Nurse Anesthetist

## 2021-02-19 ENCOUNTER — Encounter (HOSPITAL_COMMUNITY): Payer: Self-pay | Admitting: Orthopaedic Surgery

## 2021-02-19 ENCOUNTER — Encounter (HOSPITAL_COMMUNITY)
Admission: RE | Disposition: A | Discharge status: Home / Self Care | Payer: Self-pay | Source: Home / Self Care | Attending: Orthopaedic Surgery

## 2021-02-19 ENCOUNTER — Ambulatory Visit (HOSPITAL_COMMUNITY)
Admission: RE | Admit: 2021-02-19 | Discharge: 2021-02-19 | Disposition: A | Discharge status: Home / Self Care | Payer: 59 | Attending: Orthopaedic Surgery | Admitting: Orthopaedic Surgery

## 2021-02-19 DIAGNOSIS — Z8616 Personal history of COVID-19: Secondary | ICD-10-CM | POA: Diagnosis not present

## 2021-02-19 DIAGNOSIS — M659 Synovitis and tenosynovitis, unspecified: Secondary | ICD-10-CM | POA: Diagnosis not present

## 2021-02-19 DIAGNOSIS — M94261 Chondromalacia, right knee: Secondary | ICD-10-CM | POA: Diagnosis not present

## 2021-02-19 DIAGNOSIS — X58XXXA Exposure to other specified factors, initial encounter: Secondary | ICD-10-CM | POA: Insufficient documentation

## 2021-02-19 DIAGNOSIS — S83241A Other tear of medial meniscus, current injury, right knee, initial encounter: Secondary | ICD-10-CM

## 2021-02-19 DIAGNOSIS — M65969 Unspecified synovitis and tenosynovitis, unspecified lower leg: Secondary | ICD-10-CM

## 2021-02-19 HISTORY — PX: KNEE ARTHROSCOPY WITH MEDIAL MENISECTOMY: SHX5651

## 2021-02-19 SURGERY — ARTHROSCOPY, KNEE, WITH MEDIAL MENISCECTOMY
Anesthesia: General | Site: Knee | Laterality: Right

## 2021-02-19 MED ORDER — MIDAZOLAM HCL 2 MG/2ML IJ SOLN
INTRAMUSCULAR | Status: AC
  Filled 2021-02-19: qty 2

## 2021-02-19 MED ORDER — LIDOCAINE 2% (20 MG/ML) 5 ML SYRINGE
INTRAMUSCULAR | Status: AC
  Filled 2021-02-19: qty 15

## 2021-02-19 MED ORDER — PROPOFOL 10 MG/ML IV BOLUS
INTRAVENOUS | Status: DC | PRN
  Administered 2021-02-19: 200 mg via INTRAVENOUS

## 2021-02-19 MED ORDER — OXYCODONE HCL 5 MG PO TABS: 5.0000 mg | ORAL_TABLET | Freq: Once | ORAL | Status: DC | PRN

## 2021-02-19 MED ORDER — BUPIVACAINE HCL (PF) 0.25 % IJ SOLN
INTRAMUSCULAR | Status: DC | PRN
  Administered 2021-02-19: 30 mL

## 2021-02-19 MED ORDER — DEXAMETHASONE SODIUM PHOSPHATE 10 MG/ML IJ SOLN
INTRAMUSCULAR | Status: DC | PRN
  Administered 2021-02-19: 5 mg via INTRAVENOUS

## 2021-02-19 MED ORDER — ONDANSETRON HCL 4 MG/2ML IJ SOLN
INTRAMUSCULAR | Status: DC | PRN
  Administered 2021-02-19: 4 mg via INTRAVENOUS

## 2021-02-19 MED ORDER — ORAL CARE MOUTH RINSE: 15.0000 mL | Freq: Once | OROMUCOSAL | Status: AC

## 2021-02-19 MED ORDER — HYDROMORPHONE HCL 1 MG/ML IJ SOLN: 0.2500 mg | INTRAMUSCULAR | Status: DC | PRN

## 2021-02-19 MED ORDER — LACTATED RINGERS IV SOLN: INTRAVENOUS | Status: DC

## 2021-02-19 MED ORDER — FENTANYL CITRATE (PF) 250 MCG/5ML IJ SOLN
INTRAMUSCULAR | Status: AC
  Filled 2021-02-19: qty 5

## 2021-02-19 MED ORDER — LIDOCAINE 2% (20 MG/ML) 5 ML SYRINGE
INTRAMUSCULAR | Status: DC | PRN
  Administered 2021-02-19: 100 mg via INTRAVENOUS

## 2021-02-19 MED ORDER — HYDROCODONE-ACETAMINOPHEN 5-325 MG PO TABS: 1.0000 | ORAL_TABLET | Freq: Four times a day (QID) | ORAL | 0 refills | Status: DC | PRN

## 2021-02-19 MED ORDER — ONDANSETRON HCL 4 MG/2ML IJ SOLN
INTRAMUSCULAR | Status: AC
  Filled 2021-02-19: qty 2

## 2021-02-19 MED ORDER — BUPIVACAINE-EPINEPHRINE (PF) 0.25% -1:200000 IJ SOLN
INTRAMUSCULAR | Status: AC
  Filled 2021-02-19: qty 30

## 2021-02-19 MED ORDER — FENTANYL CITRATE (PF) 100 MCG/2ML IJ SOLN
INTRAMUSCULAR | Status: AC
  Filled 2021-02-19: qty 2

## 2021-02-19 MED ORDER — CEFAZOLIN SODIUM-DEXTROSE 2-4 GM/100ML-% IV SOLN
2.0000 g | INTRAVENOUS | Status: AC
  Administered 2021-02-19: 2 g via INTRAVENOUS
  Filled 2021-02-19: qty 100

## 2021-02-19 MED ORDER — PROMETHAZINE HCL 25 MG/ML IJ SOLN: 6.2500 mg | INTRAMUSCULAR | Status: DC | PRN

## 2021-02-19 MED ORDER — OXYCODONE HCL 5 MG/5ML PO SOLN: 5.0000 mg | Freq: Once | ORAL | Status: DC | PRN

## 2021-02-19 MED ORDER — ROCURONIUM BROMIDE 10 MG/ML (PF) SYRINGE
PREFILLED_SYRINGE | INTRAVENOUS | Status: AC
  Filled 2021-02-19: qty 20

## 2021-02-19 MED ORDER — DEXAMETHASONE SODIUM PHOSPHATE 10 MG/ML IJ SOLN
INTRAMUSCULAR | Status: AC
  Filled 2021-02-19: qty 1

## 2021-02-19 MED ORDER — SUCCINYLCHOLINE CHLORIDE 200 MG/10ML IV SOSY
PREFILLED_SYRINGE | INTRAVENOUS | Status: AC
  Filled 2021-02-19: qty 10

## 2021-02-19 MED ORDER — ONDANSETRON HCL 4 MG/2ML IJ SOLN
INTRAMUSCULAR | Status: AC
  Filled 2021-02-19: qty 4

## 2021-02-19 MED ORDER — FENTANYL CITRATE (PF) 250 MCG/5ML IJ SOLN
INTRAMUSCULAR | Status: DC | PRN
  Administered 2021-02-19 (×2): 50 ug via INTRAVENOUS

## 2021-02-19 MED ORDER — SODIUM CHLORIDE 0.9 % IR SOLN
Status: DC | PRN
  Administered 2021-02-19: 1000 mL

## 2021-02-19 MED ORDER — DEXAMETHASONE SODIUM PHOSPHATE 10 MG/ML IJ SOLN
INTRAMUSCULAR | Status: AC
  Filled 2021-02-19: qty 2

## 2021-02-19 MED ORDER — MIDAZOLAM HCL 2 MG/2ML IJ SOLN
INTRAMUSCULAR | Status: DC | PRN
  Administered 2021-02-19: 2 mg via INTRAVENOUS

## 2021-02-19 MED ORDER — MIDAZOLAM HCL 2 MG/2ML IJ SOLN: 0.5000 mg | Freq: Once | INTRAMUSCULAR | Status: DC | PRN

## 2021-02-19 MED ORDER — MEPERIDINE HCL 25 MG/ML IJ SOLN: 6.2500 mg | INTRAMUSCULAR | Status: DC | PRN

## 2021-02-19 MED ORDER — PROPOFOL 10 MG/ML IV BOLUS
INTRAVENOUS | Status: AC
  Filled 2021-02-19: qty 20

## 2021-02-19 MED ORDER — CHLORHEXIDINE GLUCONATE 0.12 % MT SOLN
15.0000 mL | Freq: Once | OROMUCOSAL | Status: AC
  Administered 2021-02-19: 15 mL via OROMUCOSAL
  Filled 2021-02-19: qty 15

## 2021-02-19 MED ORDER — BUPIVACAINE HCL (PF) 0.25 % IJ SOLN
INTRAMUSCULAR | Status: AC
  Filled 2021-02-19: qty 30

## 2021-02-19 SURGICAL SUPPLY — 38 items
BANDAGE ESMARK 6X9 LF (GAUZE/BANDAGES/DRESSINGS) ×1 IMPLANT
BLADE EXCALIBUR 4.0X13 (MISCELLANEOUS) ×2 IMPLANT
BLADE SHAVER TORPEDO 4X13 (MISCELLANEOUS) ×2 IMPLANT
BNDG ELASTIC 6X15 VLCR STRL LF (GAUZE/BANDAGES/DRESSINGS) ×2 IMPLANT
BNDG ELASTIC 6X5.8 VLCR STR LF (GAUZE/BANDAGES/DRESSINGS) ×4 IMPLANT
BNDG ESMARK 6X9 LF (GAUZE/BANDAGES/DRESSINGS) ×2
BURR OVAL 8 FLU 4.0X13 (MISCELLANEOUS) IMPLANT
COOLER ICEMAN CLASSIC (MISCELLANEOUS) ×2 IMPLANT
CUFF TOURN SGL QUICK 34 (TOURNIQUET CUFF) ×2
CUFF TRNQT CYL 34X4.125X (TOURNIQUET CUFF) ×1 IMPLANT
CUTTER BONE 4.0MM X 13CM (MISCELLANEOUS) IMPLANT
DRAPE ARTHROSCOPY W/POUCH 90 (DRAPES) ×2 IMPLANT
DRAPE IMP U-DRAPE 54X76 (DRAPES) ×2 IMPLANT
DRAPE U-SHAPE 47X51 STRL (DRAPES) ×2 IMPLANT
DRSG PAD ABDOMINAL 8X10 ST (GAUZE/BANDAGES/DRESSINGS) ×2 IMPLANT
DURAPREP 26ML APPLICATOR (WOUND CARE) ×4 IMPLANT
EXCALIBUR 3.8MM X 13CM (MISCELLANEOUS) IMPLANT
GAUZE SPONGE 4X4 12PLY STRL (GAUZE/BANDAGES/DRESSINGS) ×2 IMPLANT
GAUZE XEROFORM 1X8 LF (GAUZE/BANDAGES/DRESSINGS) ×2 IMPLANT
GLOVE SURG LTX SZ7 (GLOVE) ×2 IMPLANT
GLOVE SURG SYN 7.5  E (GLOVE) ×2
GLOVE SURG SYN 7.5 E (GLOVE) ×1 IMPLANT
GLOVE SURG UNDER POLY LF SZ7 (GLOVE) ×4 IMPLANT
GOWN STRL REIN XL XLG (GOWN DISPOSABLE) ×4 IMPLANT
GOWN STRL REUS W/ TWL LRG LVL3 (GOWN DISPOSABLE) ×1 IMPLANT
GOWN STRL REUS W/TWL LRG LVL3 (GOWN DISPOSABLE) ×2
MANIFOLD NEPTUNE II (INSTRUMENTS) ×2 IMPLANT
PACK ARTHROSCOPY DSU (CUSTOM PROCEDURE TRAY) ×2 IMPLANT
PACK BASIN DAY SURGERY FS (CUSTOM PROCEDURE TRAY) ×2 IMPLANT
PAD CAST 4YDX4 CTTN HI CHSV (CAST SUPPLIES) ×1 IMPLANT
PAD COLD SHLDR WRAP-ON (PAD) ×2 IMPLANT
PADDING CAST ABS 4INX4YD NS (CAST SUPPLIES) ×1
PADDING CAST ABS COTTON 4X4 ST (CAST SUPPLIES) ×1 IMPLANT
PADDING CAST COTTON 4X4 STRL (CAST SUPPLIES) ×2
SHEET MEDIUM DRAPE 40X70 STRL (DRAPES) ×2 IMPLANT
SUT ETHILON 3 0 PS 1 (SUTURE) ×2 IMPLANT
TOWEL GREEN STERILE FF (TOWEL DISPOSABLE) ×2 IMPLANT
TUBING ARTHROSCOPY IRRIG 16FT (MISCELLANEOUS) ×2 IMPLANT

## 2021-02-19 NOTE — Progress Notes (Signed)
Orthopedic Tech Progress Note Patient Details:  Jared Park 01/01/1966 [id number]  PACU RN called requesting a pair of CRUTCHES  Ortho Devices Type of Ortho Device: Crutches Ortho Device/Splint Interventions: Application, Adjustment   Post Interventions Patient Tolerated: Well Instructions Provided: Care of device  Janit Pagan 02/19/2021, 3:58 PM

## 2021-02-19 NOTE — Anesthesia Procedure Notes (Signed)
Procedure Name: LMA Insertion Date/Time: 02/19/2021 2:39 PM Performed by: Janace Litten, CRNA Pre-anesthesia Checklist: Patient identified, Emergency Drugs available, Suction available and Patient being monitored Patient Re-evaluated:Patient Re-evaluated prior to induction Oxygen Delivery Method: Circle System Utilized Preoxygenation: Pre-oxygenation with 100% oxygen Induction Type: IV induction Ventilation: Mask ventilation without difficulty LMA: LMA inserted LMA Size: 4.0 Number of attempts: 1 Airway Equipment and Method: Bite block Placement Confirmation: positive ETCO2 Tube secured with: Tape Dental Injury: Teeth and Oropharynx as per pre-operative assessment

## 2021-02-19 NOTE — Op Note (Signed)
   Surgery Date: 02/19/2021  PREOPERATIVE DIAGNOSES:  1. Right knee medial meniscus tear 2. Right knee synovitis  POSTOPERATIVE DIAGNOSES:  same  PROCEDURES PERFORMED:  1. Right knee arthroscopy with major synovectomy 2. Right knee arthroscopy with arthroscopic partial medial meniscectomy 3. Right knee arthroscopy with arthroscopic chondroplasty medial femoral condyle.  SURGEON: N. Eduard Roux, M.D.  ASSIST: Ciro Backer [place], [place]; necessary for the timely completion of procedure and due to complexity of procedure.  ANESTHESIA:  general  FLUIDS: Per anesthesia record.   ESTIMATED BLOOD LOSS: minimal  DESCRIPTION OF PROCEDURE: Jared Park is a 55 y.o.-year-old male with above mentioned conditions. Full discussion held regarding risks benefits alternatives and complications related surgical intervention. Conservative care options reviewed. All questions answered.  The patient was identified in the preoperative holding area and the operative extremity was marked. The patient was brought to the operating room and transferred to operating table in a supine position. Satisfactory general anesthesia was induced by anesthesiology.    Standard anterolateral, anteromedial arthroscopy portals were obtained. The anteromedial portal was obtained with a spinal needle for localization under direct visualization with subsequent diagnostic findings.   Diagnostic knee arthroscopy was first performed.  This revealed moderate synovitis in the medial lateral and patellofemoral compartments.  Major synovectomy was performed.  We then placed the arthroscope in the medial compartment and with a gentle valgus stress we found a complex tear of the posterior horn of the medial meniscus.  Partial medial meniscectomy was performed of the posterior horn with meniscus basket oscillating shaver.  We took about 30% of the volume of the meniscus in that region.  He had minimal chondromalacia.  Cruciates were  unremarkable.  Lateral compartment unremarkable.  Patellofemoral compartment unremarkable.  No loose bodies.  Gutters were checked for loose bodies.  Excess fluid was removed from the knee joint.  Incisions were closed with interrupted nylon sutures.  Sterile dressings were applied.  Patient tolerated procedure well had no immediate complications.  Suprapatellar pouch and gutters: moderate synovitis or debris. Patella chondral surface: Grade 1 Trochlear chondral surface: Grade 1 Patellofemoral tracking: Normal Medial meniscus: Complex tear.  Medial femoral condyle weight bearing surface: Grade 1 Medial tibial plateau: Grade 1 Anterior cruciate ligament:stable Posterior cruciate ligament:stable Lateral meniscus: Normal.   Lateral femoral condyle weight bearing surface: Grade 0 Lateral tibial plateau: Grade 0  DISPOSITION: The patient was awakened from general anesthetic, extubated, taken to the recovery room in medically stable condition, no apparent complications. The patient may be weightbearing as tolerated to the operative lower extremity.  Range of motion of right knee as tolerated.  Azucena Cecil, MD [facility] 3:22 PM

## 2021-02-19 NOTE — Transfer of Care (Signed)
Immediate Anesthesia Transfer of Care Note  Patient: Jared Park.  Procedure(s) Performed: RIGHT KNEE ARTHROSCOPY WITH PARTIAL MEDIAL MENISECTOMY (Right: Knee)  Patient Location: PACU  Anesthesia Type:General  Level of Consciousness: drowsy, patient cooperative and responds to stimulation  Airway & Oxygen Therapy: Patient Spontanous Breathing  Post-op Assessment: Report given to RN and Post -op Vital signs reviewed and stable  Post vital signs: Reviewed and stable  Last Vitals:  Vitals Value Taken Time  BP 127/83 02/19/21 1534  Temp    Pulse 57 02/19/21 1539  Resp 16 02/19/21 1539  SpO2 100 % 02/19/21 1539  Vitals shown include unvalidated device data.  Last Pain:  Vitals:   02/19/21 1534  TempSrc:   PainSc: 0-No pain      Patients Stated Pain Goal: 0 (03/70/96 4383)  Complications: No notable events documented.

## 2021-02-19 NOTE — Discharge Instructions (Addendum)
Post-operative patient instructions  Knee Arthroscopy   Ice:  Place intermittent ice or cooler pack over your knee, 30 minutes on and 30 minutes off.  Continue this for the first 72 hours after surgery, then save ice for use after therapy sessions or on more active days.   Weight:  You may bear weight on your leg as your symptoms allow. Crutches:  Use crutches (or walker) to assist in walking until told to discontinue by your physical therapist or physician. This will help to reduce pain. Strengthening:  Perform simple thigh squeezes (isometric quad contractions) and straight leg lifts as you are able (3 sets of 5 to 10 repetitions, 3 times a day).  For the leg lifts, have someone support under your ankle in the beginning until you have increased strength enough to do this on your own.  To help get started on thigh squeezes, place a pillow under your knee and push down on the pillow with back of knee (sometimes easier to do than with your leg fully straight). Motion:  Perform gentle knee motion as tolerated - this is gentle bending and straightening of the knee. Seated heel slides: you can start by sitting in a chair, remove your brace, and gently slide your heel back on the floor - allowing your knee to bend. Have someone help you straighten your knee (or use your other leg/foot hooked under your ankle.  Dressing:  Perform 1st dressing change at 2 days postoperative. A moderate amount of blood tinged drainage is to be expected.  So if you bleed through the dressing on the first or second day or if you have fevers, it is fine to change the dressing/check the wounds early and redress wound. Elevate your leg.  If it bleeds through again, or if the incisions are leaking frank blood, please call the office. May change dressing every 1-2 days thereafter to help watch wounds. Can purchase Tegaderm (or 14M Nexcare) water resistant dressings at local pharmacy / [facility]. Shower:  Light shower is ok after 2  days.  Please take shower, NO bath. Recover with gauze and ace wrap to help keep wounds protected.   Pain medication:  A narcotic pain medication has been prescribed.  Take as directed.  Typically you need narcotic pain medication more regularly during the first 3 to 5 days after surgery.  Decrease your use of the medication as the pain improves.  Narcotics can sometimes cause constipation, even after a few doses.  If you have problems with constipation, you can take an over the counter stool softener or light laxative.  If you have persistent problems, please notify your physician's office. Physical therapy: Additional activity guidelines to be provided by your physician or physical therapist at follow-up visits.  Driving: Do not recommend driving x 2 weeks post surgical, especially if surgery performed on right side. Should not drive while taking narcotic pain medications. It typically takes at least 2 weeks to restore sufficient neuromuscular function for normal reaction times for driving safety.  Call [phone] for questions or problems. Evenings you will be forwarded to the hospital operator.  Ask for the orthopaedic physician on call. Please call if you experience:    Redness, foul smelling, or persistent drainage from the surgical site  worsening knee pain and swelling not responsive to medication  any calf pain and or swelling of the lower leg  temperatures greater than 101.5 F other questions or concerns   Thank you for allowing [place] to be  a part of your care.   Post Anesthesia Home Care Instructions  Activity: Get plenty of rest for the remainder of the day. A responsible individual must stay with you for 24 hours following the procedure.  For the next 24 hours, DO NOT: -Drive a car -Paediatric nurse -Drink alcoholic beverages -Take any medication unless instructed by your physician -Make any legal decisions or sign important papers.  Meals: Start with liquid foods such as  gelatin or soup. Progress to regular foods as tolerated. Avoid greasy, spicy, heavy foods. If nausea and/or vomiting occur, drink only clear liquids until the nausea and/or vomiting subsides. Call your physician if vomiting continues.  Special Instructions/Symptoms: Your throat may feel dry or sore from the anesthesia or the breathing tube placed in your throat during surgery. If this causes discomfort, gargle with warm salt water. The discomfort should disappear within 24 hours.

## 2021-02-19 NOTE — Anesthesia Preprocedure Evaluation (Addendum)
Anesthesia Evaluation  Patient identified by MRN, date of birth, ID band Patient awake    Reviewed: Allergy & Precautions, NPO status , Patient's Chart, lab work & pertinent test results  Airway Mallampati: I  TM Distance: >3 FB Neck ROM: Full    Dental  (+) Teeth Intact, Dental Advisory Given   Pulmonary neg pulmonary ROS,    breath sounds clear to auscultation       Cardiovascular negative cardio ROS   Rhythm:Regular Rate:Normal     Neuro/Psych  Headaches, negative psych ROS   GI/Hepatic negative GI ROS, Neg liver ROS, H/o elevated LFTs   Endo/Other  negative endocrine ROS  Renal/GU negative Renal ROS     Musculoskeletal negative musculoskeletal ROS (+)   Abdominal Normal abdominal exam  (+)   Peds  Hematology negative hematology ROS (+)   Anesthesia Other Findings   Reproductive/Obstetrics                            Anesthesia Physical Anesthesia Plan  ASA: 2  Anesthesia Plan: General   Post-op Pain Management:    Induction: Intravenous  PONV Risk Score and Plan: 2 and Ondansetron, Dexamethasone and Treatment may vary due to age or medical condition  Airway Management Planned: LMA  Additional Equipment: None  Intra-op Plan:   Post-operative Plan: Extubation in OR  Informed Consent: I have reviewed the patients History and Physical, chart, labs and discussed the procedure including the risks, benefits and alternatives for the proposed anesthesia with the patient or authorized representative who has indicated his/her understanding and acceptance.     Dental advisory given  Plan Discussed with: CRNA  Anesthesia Plan Comments:        Anesthesia Quick Evaluation

## 2021-02-20 ENCOUNTER — Encounter (HOSPITAL_COMMUNITY): Payer: Self-pay | Admitting: Orthopaedic Surgery

## 2021-02-20 NOTE — Anesthesia Postprocedure Evaluation (Signed)
Anesthesia Post Note  Patient: Jared Park.  Procedure(s) Performed: RIGHT KNEE ARTHROSCOPY WITH PARTIAL MEDIAL MENISECTOMY (Right: Knee)     Patient location during evaluation: PACU Anesthesia Type: General Level of consciousness: awake and alert Pain management: pain level controlled Vital Signs Assessment: post-procedure vital signs reviewed and stable Respiratory status: spontaneous breathing, nonlabored ventilation, respiratory function stable and patient connected to nasal cannula oxygen Cardiovascular status: blood pressure returned to baseline and stable Postop Assessment: no apparent nausea or vomiting Anesthetic complications: no   No notable events documented.  Last Vitals:  Vitals:   02/19/21 1550 02/19/21 1600  BP: (!) 142/97   Pulse: (!) 51 (!) 59  Resp: (!) 24 20  Temp:    SpO2: 100% 99%    Last Pain:  Vitals:   02/19/21 1550  TempSrc:   PainSc: 0-No pain                 Haider Hornaday S

## 2021-02-24 NOTE — H&P (Signed)
PREOPERATIVE H&P  Chief Complaint: right knee medial meniscal tear  HPI: Jared Park. is a 55 y.o. male who presents for surgical treatment of right knee medial meniscal tear.  He denies any changes in medical history.  Past Medical History:  Diagnosis Date   COVID 03/2020   MMT (medial meniscus tear)    right   Past Surgical History:  Procedure Laterality Date   KNEE ARTHROSCOPY WITH MEDIAL MENISECTOMY Right 02/19/2021   Procedure: RIGHT KNEE ARTHROSCOPY WITH PARTIAL MEDIAL MENISECTOMY;  Surgeon: Leandrew Koyanagi, MD;  Location: [facility];  Service: Orthopedics;  Laterality: Right;   WISDOM TOOTH EXTRACTION     age 89   Social History   Socioeconomic History   Marital status: Married    Spouse name: Not on file   Number of children: Not on file   Years of education: Not on file   Highest education level: Not on file  Occupational History   Not on file  Tobacco Use   Smoking status: Never   Smokeless tobacco: Never  Vaping Use   Vaping Use: Never used  Substance and Sexual Activity   Alcohol use: Not Currently    Comment: none since 03-2020   Drug use: No   Sexual activity: Yes  Other Topics Concern   Not on file  Social History Narrative   Not on file   Social Determinants of Health   Financial Resource Strain: Not on file  Food Insecurity: Not on file  Transportation Needs: Not on file  Physical Activity: Not on file  Stress: Not on file  Social Connections: Not on file   Family History  Problem Relation Age of Onset   Diabetes Mother    Hyperlipidemia Mother    Hypertension Mother    Stroke Father    Healthy Sister    Healthy Brother    Colon polyps Neg Hx    Colon cancer Neg Hx    Esophageal cancer Neg Hx    Rectal cancer Neg Hx    Stomach cancer Neg Hx    No Known Allergies Prior to Admission medications   Medication Sig Start Date End Date Taking? Authorizing Provider  acetaminophen (TYLENOL) 500 MG tablet Take 1,000 mg by mouth every 6  (six) hours as needed for moderate pain or headache.   Yes [provider]  HYDROcodone-acetaminophen (NORCO) 5-325 MG tablet Take 1 tablet by mouth every 6 (six) hours as needed. 02/19/21  Yes Leandrew Koyanagi, MD  ibuprofen (ADVIL) 200 MG tablet Take 400 mg by mouth every 6 (six) hours as needed for headache or moderate pain.    [provider]  tadalafil (CIALIS) 20 MG tablet Take 0.5-1 tablets (10-20 mg total) by mouth every other day as needed for erectile dysfunction. 10/25/20   Haydee Salter, MD     Positive ROS: All other systems have been reviewed and were otherwise negative with the exception of those mentioned in the HPI and as above.  Physical Exam: General: Alert, no acute distress Cardiovascular: No pedal edema Respiratory: No cyanosis, no use of accessory musculature GI: abdomen soft Skin: No lesions in the area of chief complaint Neurologic: Sensation intact distally Psychiatric: Patient is competent for consent with normal mood and affect Lymphatic: no lymphedema  MUSCULOSKELETAL: exam stable  Assessment: right knee medial meniscal tear  Plan: Plan for Procedure(s): RIGHT KNEE ARTHROSCOPY WITH PARTIAL MEDIAL MENISECTOMY  The risks benefits and alternatives were discussed with the patient including  but not limited to the risks of nonoperative treatment, versus surgical intervention including infection, bleeding, nerve injury,  blood clots, cardiopulmonary complications, morbidity, mortality, among others, and they were willing to proceed.   Preoperative templating of the joint replacement has been completed, documented, and submitted to the Operating Room personnel in order to optimize intra-operative equipment management.   Eduard Roux, MD 02/24/2021 6:09 AM

## 2021-02-26 ENCOUNTER — Encounter: Payer: Self-pay | Admitting: Orthopaedic Surgery

## 2021-02-26 ENCOUNTER — Other Ambulatory Visit: Payer: Self-pay

## 2021-02-26 ENCOUNTER — Ambulatory Visit (INDEPENDENT_AMBULATORY_CARE_PROVIDER_SITE_OTHER): Payer: 59 | Admitting: Orthopaedic Surgery

## 2021-02-26 DIAGNOSIS — Z9889 Other specified postprocedural states: Secondary | ICD-10-CM

## 2021-02-26 NOTE — Addendum Note (Signed)
Addended by: Precious Bard on: 02/26/2021 11:10 AM   Modules accepted: Orders

## 2021-02-26 NOTE — Progress Notes (Signed)
   Post-Op Visit Note   Patient: Jared Park.           Date of Birth: 09-19-1965           MRN: [id number] Visit Date: 02/26/2021 PCP: [name], MD   Assessment & Plan:  Chief Complaint:  Chief Complaint  Patient presents with   Right Knee - Pain   Visit Diagnoses:  1. S/P right knee arthroscopy     Plan: Patient is a pleasant 55 year old gentleman who comes in today 1 week out right knee arthroscopic debridement medial meniscus, date of surgery 02/19/2021.  He has been doing well.  He is taking Norco as needed.  He is ambulating without crutches.  He has however been complaining of moderate amount of calf pain for the past several days.  He denies any chest pain or shortness of breath.  No personal or family history of DVT/PE.  He is a non-smoker.  He is not on any anticoagulants.  Examination of his right knee reveals a small effusion.  He does have 2 fully healed surgical scars with nylon sutures in place.  Calf is moderately tender and swollen.  Positive Homans.  He is neurovascular intact distally.  Today, sutures were removed and Steri-Strips applied.  Intraoperative pictures reviewed.  We will order Doppler ultrasound right lower extremity to rule out DVT.  M exercises were provided.  He will follow-up with [place] in 5 weeks time for recheck.  He has been instructed to go to the ED should he develop any chest pain or shortness of breath.  Follow-Up Instructions: No follow-ups on file.   Orders:  No orders of the defined types were placed in this encounter.  No orders of the defined types were placed in this encounter.   Imaging: No new imaging  PMFS History: Patient Active Problem List   Diagnosis Date Noted   Acute medial meniscus tear, right, initial encounter 02/19/2021   Synovitis of knee 02/19/2021   Erectile dysfunction 10/25/2020   Elevated LFTs 06/11/2020   DOE (dyspnea on exertion) 06/11/2020   Elevated LDL cholesterol level 03/12/2020    Cervical strain 03/12/2020   COMMON MIGRAINE 05/17/2007   Past Medical History:  Diagnosis Date   COVID 03/2020   MMT (medial meniscus tear)    right    Family History  Problem Relation Age of Onset   Diabetes Mother    Hyperlipidemia Mother    Hypertension Mother    Stroke Father    Healthy Sister    Healthy Brother    Colon polyps Neg Hx    Colon cancer Neg Hx    Esophageal cancer Neg Hx    Rectal cancer Neg Hx    Stomach cancer Neg Hx     Past Surgical History:  Procedure Laterality Date   KNEE ARTHROSCOPY WITH MEDIAL MENISECTOMY Right 02/19/2021   Procedure: RIGHT KNEE ARTHROSCOPY WITH PARTIAL MEDIAL MENISECTOMY;  Surgeon: Leandrew Koyanagi, MD;  Location: [facility];  Service: Orthopedics;  Laterality: Right;   WISDOM TOOTH EXTRACTION     age 75   Social History   Occupational History   Not on file  Tobacco Use   Smoking status: Never   Smokeless tobacco: Never  Vaping Use   Vaping Use: Never used  Substance and Sexual Activity   Alcohol use: Not Currently    Comment: none since 03-2020   Drug use: No   Sexual activity: Yes

## 2021-02-27 ENCOUNTER — Ambulatory Visit (HOSPITAL_COMMUNITY)
Admission: RE | Admit: 2021-02-27 | Discharge: 2021-02-27 | Disposition: A | Discharge status: Home / Self Care | Payer: 59 | Source: Ambulatory Visit | Attending: Orthopaedic Surgery | Admitting: Orthopaedic Surgery

## 2021-02-27 ENCOUNTER — Telehealth: Payer: Self-pay

## 2021-02-27 DIAGNOSIS — Z9889 Other specified postprocedural states: Secondary | ICD-10-CM

## 2021-02-27 NOTE — Telephone Encounter (Signed)
Pts wife called stating that the new rx for hydrocodone  hasn't been sent in to the pharmacy

## 2021-02-28 ENCOUNTER — Other Ambulatory Visit: Payer: Self-pay | Admitting: Physician Assistant

## 2021-02-28 MED ORDER — HYDROCODONE-ACETAMINOPHEN 5-325 MG PO TABS: 1.0000 | ORAL_TABLET | Freq: Three times a day (TID) | ORAL | 0 refills | Status: DC | PRN

## 2021-02-28 NOTE — Telephone Encounter (Signed)
Sent in

## 2021-03-18 ENCOUNTER — Encounter: Payer: Self-pay | Admitting: Orthopaedic Surgery

## 2021-03-18 ENCOUNTER — Ambulatory Visit (INDEPENDENT_AMBULATORY_CARE_PROVIDER_SITE_OTHER): Payer: 59 | Admitting: Physician Assistant

## 2021-03-18 DIAGNOSIS — Z9889 Other specified postprocedural states: Secondary | ICD-10-CM

## 2021-03-18 NOTE — Progress Notes (Signed)
   Post-Op Visit Note   Patient: Jared Park.           Date of Birth: 05/12/1966           MRN: [id number] Visit Date: 03/18/2021 PCP: [name], MD   Assessment & Plan:  Chief Complaint:  Chief Complaint  Patient presents with   Right Knee - Routine Post Op   Visit Diagnoses:  1. S/P right knee arthroscopy     Plan: Patient is a pleasant 55 year old gentleman who comes in today almost 4 weeks out right knee arthroscopic debridement medial meniscus and synovectomy, date of surgery 02/19/2021.  He was doing well.  He thinks he overdid it over the weekend and had increased swelling to the right knee.  The swelling has subsided over the past day or 2.  No fevers or chills.  Recent venous Doppler ultrasound right lower extremity was negative for DVT.  Examination of his right knee reveals a trace effusion.  Range of motion from 0 to 120 degrees.  Fully healed surgical scars without complication.  He is neurovascular intact distally.  At this point, I will recommend to gradually increase activity as tolerated.  If he does happen to overdo things again he will back off activity and ice his knee.  He will follow-up with [place] in 4 weeks time for recheck.  If he is doing very well without any issues, he will call us and let us know and can cancel the appointment.  Call with concerns or questions.  Follow-Up Instructions: Return in about 4 weeks (around 04/15/2021).   Orders:  No orders of the defined types were placed in this encounter.  No orders of the defined types were placed in this encounter.   Imaging: No new imaging  PMFS History: Patient Active Problem List   Diagnosis Date Noted   Acute medial meniscus tear, right, initial encounter 02/19/2021   Synovitis of knee 02/19/2021   Erectile dysfunction 10/25/2020   Elevated LFTs 06/11/2020   DOE (dyspnea on exertion) 06/11/2020   Elevated LDL cholesterol level 03/12/2020   Cervical strain 03/12/2020   COMMON  MIGRAINE 05/17/2007   Past Medical History:  Diagnosis Date   COVID 03/2020   MMT (medial meniscus tear)    right    Family History  Problem Relation Age of Onset   Diabetes Mother    Hyperlipidemia Mother    Hypertension Mother    Stroke Father    Healthy Sister    Healthy Brother    Colon polyps Neg Hx    Colon cancer Neg Hx    Esophageal cancer Neg Hx    Rectal cancer Neg Hx    Stomach cancer Neg Hx     Past Surgical History:  Procedure Laterality Date   KNEE ARTHROSCOPY WITH MEDIAL MENISECTOMY Right 02/19/2021   Procedure: RIGHT KNEE ARTHROSCOPY WITH PARTIAL MEDIAL MENISECTOMY;  Surgeon: Leandrew Koyanagi, MD;  Location: [facility];  Service: Orthopedics;  Laterality: Right;   WISDOM TOOTH EXTRACTION     age 89   Social History   Occupational History   Not on file  Tobacco Use   Smoking status: Never   Smokeless tobacco: Never  Vaping Use   Vaping Use: Never used  Substance and Sexual Activity   Alcohol use: Not Currently    Comment: none since 03-2020   Drug use: No   Sexual activity: Yes

## 2021-04-02 ENCOUNTER — Ambulatory Visit (HOSPITAL_COMMUNITY)
Admission: EM | Admit: 2021-04-02 | Discharge: 2021-04-02 | Disposition: A | Discharge status: Home / Self Care | Payer: 59 | Attending: Internal Medicine | Admitting: Internal Medicine

## 2021-04-02 ENCOUNTER — Other Ambulatory Visit: Payer: Self-pay

## 2021-04-02 DIAGNOSIS — Z20822 Contact with and (suspected) exposure to covid-19: Secondary | ICD-10-CM | POA: Diagnosis present

## 2021-04-02 NOTE — ED Triage Notes (Signed)
Pt states having exposure to covid-19 with no symptoms.

## 2021-04-03 LAB — SARS CORONAVIRUS 2 (TAT 6-24 HRS): SARS Coronavirus 2: NEGATIVE

## 2021-04-28 ENCOUNTER — Telehealth: Payer: Self-pay | Admitting: Orthopaedic Surgery

## 2021-04-28 NOTE — Telephone Encounter (Signed)
Pt's wife called requesting an appt for pt tomorrow if possible. Pt hurt his back. Phone number is [phone].

## 2021-04-28 NOTE — Telephone Encounter (Signed)
Yeah that's fine.  Have him come in Tuesday morning

## 2021-04-29 NOTE — Telephone Encounter (Signed)
Called no answer LMOM.  Can be worked in [employer] AM- 9:15

## 2021-04-30 ENCOUNTER — Ambulatory Visit (INDEPENDENT_AMBULATORY_CARE_PROVIDER_SITE_OTHER): Payer: 59

## 2021-04-30 ENCOUNTER — Other Ambulatory Visit: Payer: Self-pay

## 2021-04-30 ENCOUNTER — Encounter: Payer: Self-pay | Admitting: Orthopaedic Surgery

## 2021-04-30 ENCOUNTER — Ambulatory Visit (INDEPENDENT_AMBULATORY_CARE_PROVIDER_SITE_OTHER): Payer: 59 | Admitting: Orthopaedic Surgery

## 2021-04-30 DIAGNOSIS — T148XXA Other injury of unspecified body region, initial encounter: Secondary | ICD-10-CM

## 2021-04-30 MED ORDER — TIZANIDINE HCL 4 MG PO TABS: 4.0000 mg | ORAL_TABLET | Freq: Two times a day (BID) | ORAL | 0 refills | Status: DC | PRN

## 2021-04-30 MED ORDER — DICLOFENAC SODIUM 75 MG PO TBEC: 75.0000 mg | DELAYED_RELEASE_TABLET | Freq: Two times a day (BID) | ORAL | 2 refills | Status: DC | PRN

## 2021-04-30 NOTE — Progress Notes (Signed)
Office Visit Note   Patient: Jared Park.           Date of Birth: Mar 31, 1966           MRN: [id number] Visit Date: 04/30/2021              Requested by: [name], MD [address] [place],  [place] [address] PCP: [name], MD   Assessment & Plan: Visit Diagnoses:  1. Muscle strain     Plan: Impression is left sided lower thoracic muscles strain.  We have discussed starting him on a prescription NSAID in addition to a muscle relaxer.  He will not take any over-the-counter NSAIDs while on the prescription medication.  He will follow-up with [place] as needed.  Call with concerns or questions.  Follow-Up Instructions: Return if symptoms worsen or fail to improve.   Orders:  Orders Placed This Encounter  Procedures   XR Thoracic Spine 2 View   Meds ordered this encounter  Medications   diclofenac (VOLTAREN) 75 MG EC tablet    Sig: Take 1 tablet (75 mg total) by mouth 2 (two) times daily as needed.    Dispense:  60 tablet    Refill:  2   tiZANidine (ZANAFLEX) 4 MG tablet    Sig: Take 1 tablet (4 mg total) by mouth 2 (two) times daily as needed for muscle spasms.    Dispense:  30 tablet    Refill:  0       Procedures: No procedures performed   Clinical Data: No additional findings.   Subjective: Chief Complaint  Patient presents with   Middle Back - Pain    HPI patient is a pleasant 55 year old gentleman who comes in today with left mid to lower back pain.  This began about a week ago after a lot of repetitive heavy lifting.  The pain seems to be worse when he is looking over his right shoulder or turning a certain way in bed.  He is able to get relief with lying in a certain position as well as when he takes Motrin.  He denies any paresthesias.  No chest pain or shortness of breath.  Review of Systems as detailed in HPI.  All others reviewed and are negative.   Objective: Vital Signs: There were no vitals taken for this  visit.  Physical Exam well-developed well-nourished gentleman in no acute distress.  Alert and oriented x3.  Ortho Exam lumbar and thoracic spine exam shows mild paraspinous tenderness to the left lower thoracic spine.  No pain with flexion or extension.  He has increased pain with right-sided rotation.  No focal weakness.  He is neurovascular intact distally.  Specialty Comments:  No specialty comments available.  Imaging: XR Thoracic Spine 2 View  Result Date: 04/30/2021 No acute or structural abnormalities    PMFS History: Patient Active Problem List   Diagnosis Date Noted   Acute medial meniscus tear, right, initial encounter 02/19/2021   Synovitis of knee 02/19/2021   Erectile dysfunction 10/25/2020   Elevated LFTs 06/11/2020   DOE (dyspnea on exertion) 06/11/2020   Elevated LDL cholesterol level 03/12/2020   Cervical strain 03/12/2020   COMMON MIGRAINE 05/17/2007   Past Medical History:  Diagnosis Date   COVID 03/2020   MMT (medial meniscus tear)    right    Family History  Problem Relation Age of Onset   Diabetes Mother    Hyperlipidemia Mother    Hypertension Mother  Stroke Father    Healthy Sister    Healthy Brother    Colon polyps Neg Hx    Colon cancer Neg Hx    Esophageal cancer Neg Hx    Rectal cancer Neg Hx    Stomach cancer Neg Hx     Past Surgical History:  Procedure Laterality Date   KNEE ARTHROSCOPY WITH MEDIAL MENISECTOMY Right 02/19/2021   Procedure: RIGHT KNEE ARTHROSCOPY WITH PARTIAL MEDIAL MENISECTOMY;  Surgeon: Leandrew Koyanagi, MD;  Location: [facility];  Service: Orthopedics;  Laterality: Right;   WISDOM TOOTH EXTRACTION     age 25   Social History   Occupational History   Not on file  Tobacco Use   Smoking status: Never   Smokeless tobacco: Never  Vaping Use   Vaping Use: Never used  Substance and Sexual Activity   Alcohol use: Not Currently    Comment: none since 03-2020   Drug use: No   Sexual activity: Yes

## 2021-04-30 NOTE — Telephone Encounter (Signed)
APPT MADE

## 2021-05-08 ENCOUNTER — Other Ambulatory Visit: Payer: Self-pay

## 2021-05-08 ENCOUNTER — Ambulatory Visit (INDEPENDENT_AMBULATORY_CARE_PROVIDER_SITE_OTHER): Payer: 59 | Admitting: Orthopaedic Surgery

## 2021-05-08 ENCOUNTER — Encounter: Payer: Self-pay | Admitting: Orthopaedic Surgery

## 2021-05-08 VITALS — BP 123/79 | HR 58 | Temp 97.8°F | Ht 66.0 in | Wt 171.0 lb

## 2021-05-08 DIAGNOSIS — M546 Pain in thoracic spine: Secondary | ICD-10-CM | POA: Diagnosis not present

## 2021-05-08 MED ORDER — CYCLOBENZAPRINE HCL 10 MG PO TABS: 10.0000 mg | ORAL_TABLET | Freq: Two times a day (BID) | ORAL | 0 refills | Status: DC | PRN

## 2021-05-08 MED ORDER — PREDNISONE 5 MG (21) PO TBPK: ORAL_TABLET | ORAL | 0 refills | Status: DC

## 2021-05-08 NOTE — Progress Notes (Signed)
Office Visit Note   Patient: Jared Park.           Date of Birth: 09/05/1965           MRN: [id number] Visit Date: 05/08/2021              Requested by: [name], MD [address] [place],  [place] [address] PCP: [name], MD   Assessment & Plan: Visit Diagnoses:  1. Pain in thoracic spine     Plan: Impression is left-sided thoracic muscle strain.  I do not believe this is cardiopulmonary related at this point, have started the patient on a steroid and switched his muscle relaxer.  I have also sent in an internal referral for physical therapy to work on modalities.  He will follow-up with [place] as needed.  Call with any concerns or questions in the meantime.  Follow-Up Instructions: Return if symptoms worsen or fail to improve.   Orders:  No orders of the defined types were placed in this encounter.  Meds ordered this encounter  Medications   cyclobenzaprine (FLEXERIL) 10 MG tablet    Sig: Take 1 tablet (10 mg total) by mouth 2 (two) times daily as needed for muscle spasms.    Dispense:  30 tablet    Refill:  0   predniSONE (STERAPRED UNI-PAK 21 TAB) 5 MG (21) TBPK tablet    Sig: Take as directed    Dispense:  21 tablet    Refill:  0       Procedures: No procedures performed   Clinical Data: No additional findings.   Subjective: Chief Complaint  Patient presents with   Middle Back - Follow-up, Pain    HPI patient is a pleasant 55 year old gentleman who comes in today with continued left thoracic parascapular pain.  This began following repetitive heavy lifting.  He was seen in our office a week ago for this.  He was started on steroids and a muscle relaxer.  He is here today as his symptoms have not improved.  Symptoms are worse with any movement where he turns to the right.  No pain at rest.  He denies any chest pain or shortness of breath.  Review of Systems as detailed in HPI.  All others reviewed and are  negative.   Objective: Vital Signs: BP 123/79   Pulse (!) 58   Temp 97.8 F (36.6 C)   Ht '5\' 6"'$  (1.676 m)   Wt 171 lb (77.6 kg)   SpO2 96%   BMI 27.60 kg/m blood pressure 123/79, pulse 58, O2 sat 96% on room air, oral temperature 97.8 F.  Physical Exam well-developed well-nourished gentleman in no acute distress.  Alert and oriented x3.  Ortho Exam thoracic spine exam shows mild to moderate tenderness to the left paraspinous musculature.  Increased pain with right-sided rotation.  Specialty Comments:  No specialty comments available.  Imaging: No new imaging   PMFS History: Patient Active Problem List   Diagnosis Date Noted   Acute medial meniscus tear, right, initial encounter 02/19/2021   Synovitis of knee 02/19/2021   Erectile dysfunction 10/25/2020   Elevated LFTs 06/11/2020   DOE (dyspnea on exertion) 06/11/2020   Elevated LDL cholesterol level 03/12/2020   Cervical strain 03/12/2020   COMMON MIGRAINE 05/17/2007   Past Medical History:  Diagnosis Date   COVID 03/2020   MMT (medial meniscus tear)    right    Family History  Problem Relation Age of Onset  Diabetes Mother    Hyperlipidemia Mother    Hypertension Mother    Stroke Father    Healthy Sister    Healthy Brother    Colon polyps Neg Hx    Colon cancer Neg Hx    Esophageal cancer Neg Hx    Rectal cancer Neg Hx    Stomach cancer Neg Hx     Past Surgical History:  Procedure Laterality Date   KNEE ARTHROSCOPY WITH MEDIAL MENISECTOMY Right 02/19/2021   Procedure: RIGHT KNEE ARTHROSCOPY WITH PARTIAL MEDIAL MENISECTOMY;  Surgeon: Leandrew Koyanagi, MD;  Location: [facility];  Service: Orthopedics;  Laterality: Right;   WISDOM TOOTH EXTRACTION     age 82   Social History   Occupational History   Not on file  Tobacco Use   Smoking status: Never   Smokeless tobacco: Never  Vaping Use   Vaping Use: Never used  Substance and Sexual Activity   Alcohol use: Not Currently    Comment: none since 03-2020    Drug use: No   Sexual activity: Yes

## 2021-06-03 ENCOUNTER — Ambulatory Visit (INDEPENDENT_AMBULATORY_CARE_PROVIDER_SITE_OTHER): Payer: 59 | Admitting: Orthopaedic Surgery

## 2021-06-03 ENCOUNTER — Other Ambulatory Visit: Payer: Self-pay

## 2021-06-03 ENCOUNTER — Encounter: Payer: Self-pay | Admitting: Orthopaedic Surgery

## 2021-06-03 DIAGNOSIS — M546 Pain in thoracic spine: Secondary | ICD-10-CM

## 2021-06-03 DIAGNOSIS — M545 Low back pain, unspecified: Secondary | ICD-10-CM

## 2021-06-03 MED ORDER — TRAMADOL HCL 50 MG PO TABS: 50.0000 mg | ORAL_TABLET | Freq: Two times a day (BID) | ORAL | 0 refills | Status: DC | PRN

## 2021-06-03 NOTE — Progress Notes (Signed)
Office Visit Note   Patient: Jared Park.           Date of Birth: 1965/12/25           MRN: [id number] Visit Date: 06/03/2021              Requested by: [name], MD [address] [place],  [place] [address] PCP: [name], MD   Assessment & Plan: Visit Diagnoses:  1. Pain in thoracic spine     Plan: Impression is chronic left-sided thoracic and lumbar pain which has not improved with medication and therapy.  At this point, would like to order MRIs of both the thoracic and lumbar spines to assess for structural abnormalities.  He will follow-up with [place] once completed.  I will call tramadol to take in the meantime.  Follow-Up Instructions: Return for after MRI.   Orders:  No orders of the defined types were placed in this encounter.  No orders of the defined types were placed in this encounter.     Procedures: No procedures performed   Clinical Data: No additional findings.   Subjective: Chief Complaint  Patient presents with   Middle Back - Follow-up, Pain    HPI patient comes in today with continued left-sided thoracic and lumbar spine pain for the past few months.  Initially injured this with repetitive lifting.  He has been on steroids, muscle relaxers, anti-inflammatories and pain medicine as well as performing therapy without relief of symptoms.  In fact, he notes his symptoms have worsened.  He denies any cardiopulmonary symptoms and has even been seen by cardiology.  No urinary symptoms.  No bowel or bladder change or saddle paresthesias.  Review of Systems as detailed in HPI.  All others reviewed and are negative.   Objective: Vital Signs: There were no vitals taken for this visit.  Physical Exam well-developed and well-nourished gentleman in no acute distress.  Alert and oriented x3.  Ortho Exam spine exam shows mild thoracic and lumbar parascapular tenderness.  No tenderness along the spine.  No CVA tenderness.   Increased pain with rotation.  Negative straight leg raise.  No focal weakness.  He is neurovascular intact distally.  Specialty Comments:  No specialty comments available.  Imaging: No new imaging   PMFS History: Patient Active Problem List   Diagnosis Date Noted   Acute medial meniscus tear, right, initial encounter 02/19/2021   Synovitis of knee 02/19/2021   Erectile dysfunction 10/25/2020   Elevated LFTs 06/11/2020   DOE (dyspnea on exertion) 06/11/2020   Elevated LDL cholesterol level 03/12/2020   Cervical strain 03/12/2020   COMMON MIGRAINE 05/17/2007   Past Medical History:  Diagnosis Date   COVID 03/2020   MMT (medial meniscus tear)    right    Family History  Problem Relation Age of Onset   Diabetes Mother    Hyperlipidemia Mother    Hypertension Mother    Stroke Father    Healthy Sister    Healthy Brother    Colon polyps Neg Hx    Colon cancer Neg Hx    Esophageal cancer Neg Hx    Rectal cancer Neg Hx    Stomach cancer Neg Hx     Past Surgical History:  Procedure Laterality Date   KNEE ARTHROSCOPY WITH MEDIAL MENISECTOMY Right 02/19/2021   Procedure: RIGHT KNEE ARTHROSCOPY WITH PARTIAL MEDIAL MENISECTOMY;  Surgeon: Leandrew Koyanagi, MD;  Location: [facility];  Service: Orthopedics;  Laterality: Right;  WISDOM TOOTH EXTRACTION     age 32   Social History   Occupational History   Not on file  Tobacco Use   Smoking status: Never   Smokeless tobacco: Never  Vaping Use   Vaping Use: Never used  Substance and Sexual Activity   Alcohol use: Not Currently    Comment: none since 03-2020   Drug use: No   Sexual activity: Yes

## 2021-06-06 ENCOUNTER — Encounter: Payer: Self-pay | Admitting: Orthopaedic Surgery

## 2021-06-06 ENCOUNTER — Other Ambulatory Visit: Payer: Self-pay | Admitting: Surgical

## 2021-06-06 ENCOUNTER — Other Ambulatory Visit (HOSPITAL_COMMUNITY): Payer: Self-pay | Admitting: Orthopaedic Surgery

## 2021-06-06 ENCOUNTER — Telehealth: Payer: Self-pay | Admitting: Orthopaedic Surgery

## 2021-06-06 DIAGNOSIS — M545 Low back pain, unspecified: Secondary | ICD-10-CM

## 2021-06-06 DIAGNOSIS — M546 Pain in thoracic spine: Secondary | ICD-10-CM

## 2021-06-06 MED ORDER — ACETAMINOPHEN-CODEINE #3 300-30 MG PO TABS: 1.0000 | ORAL_TABLET | Freq: Two times a day (BID) | ORAL | 0 refills | Status: DC | PRN

## 2021-06-06 NOTE — Telephone Encounter (Signed)
Called pt back and informed him I sent order over to [facility], they will contact pt to schedule appt

## 2021-06-06 NOTE — Telephone Encounter (Signed)
Pt has an MRI on 06/14/21 but he states he can't wait tat long because he is in so much pain. Pt would l;ike our office to see if we can find another facility with a sooner appt.   [phone]

## 2021-06-06 NOTE — Telephone Encounter (Signed)
Sent in Tylenol #3

## 2021-06-09 ENCOUNTER — Telehealth: Payer: Self-pay

## 2021-06-09 NOTE — Telephone Encounter (Signed)
I called and spoke with Elberta Fortis, after updating the referrals with the authorization information. He was previously scheduled at [facility] for 10/15, but will be going to [facility] tomorrow instead for the 2 MRIs. Elberta Fortis could not see the orders from the ancillary orders, but was able to see them under the referral tab, so all is a go. I called [facility] and spoke with [facility], asking her to cancel the appts for 06/14/21.

## 2021-06-09 NOTE — Telephone Encounter (Signed)
Elberta Fortis from [facility] radiology states that patient has scheduled MRI's tomorrow however they are not able to see order's for these. Informed radiologist that I was able to see orders in imagining tab however he was not. States that corrections will need to be mad no later then 2:00pm on 06/09/2021 in order for patient to keep appointment.

## 2021-06-10 ENCOUNTER — Ambulatory Visit (HOSPITAL_COMMUNITY)
Admission: RE | Admit: 2021-06-10 | Discharge: 2021-06-10 | Disposition: A | Discharge status: Home / Self Care | Payer: 59 | Source: Ambulatory Visit | Attending: Orthopaedic Surgery | Admitting: Orthopaedic Surgery

## 2021-06-10 DIAGNOSIS — M546 Pain in thoracic spine: Secondary | ICD-10-CM | POA: Diagnosis not present

## 2021-06-10 DIAGNOSIS — M545 Low back pain, unspecified: Secondary | ICD-10-CM | POA: Diagnosis present

## 2021-06-10 IMAGING — MR MR THORACIC SPINE W/O CM
5 series · 38 of 48 positions shown · non-contrast
Comparison: Thoracic radiography [DATE]

CLINICAL DATA: Thoracic spine pain.  Low back pain

EXAM:
MRI THORACIC AND LUMBAR SPINE WITHOUT CONTRAST
TECHNIQUE: Multiplanar and multiecho pulse sequences of the thoracic and lumbar
spine were obtained without intravenous contrast.

[Series 16: STIR · sagittal · 3.0mm · 1.03mm/px · 9 of 15 slices shown]
[im 1/15]
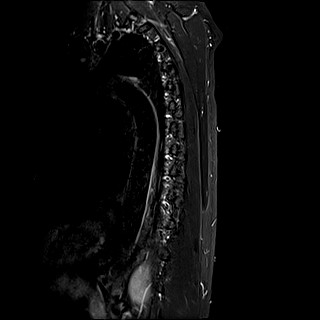
[im 2/15]
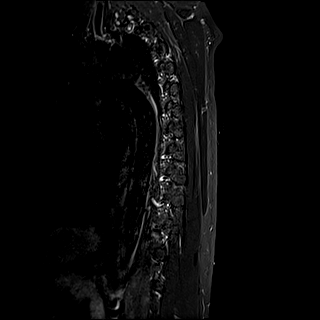
[im 4/15]
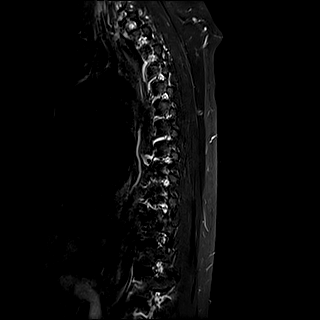
[im 6/15]
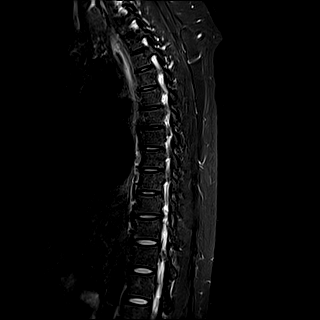
[im 8/15]
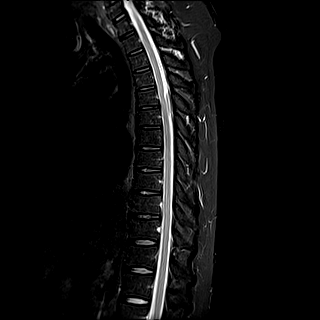
[im 9/15]
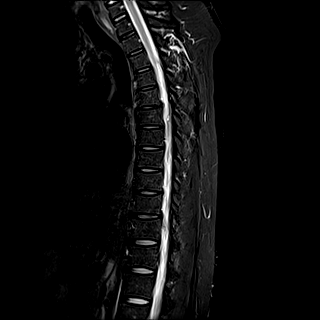
[im 11/15]
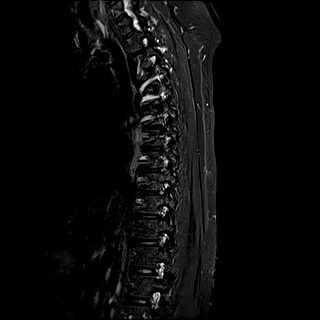
[im 13/15]
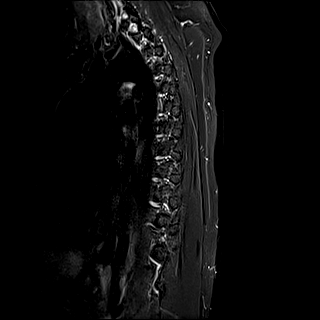
[im 15/15]
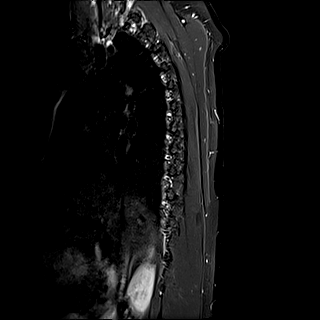

[Series 17: T1 · sagittal · 3.0mm · 1.03mm/px · 9 of 15 slices shown]
[im 1/15]
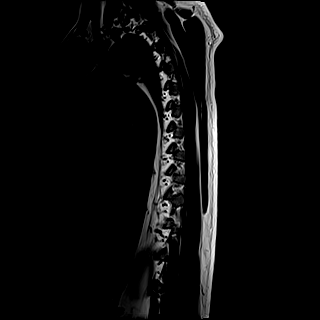
[im 2/15]
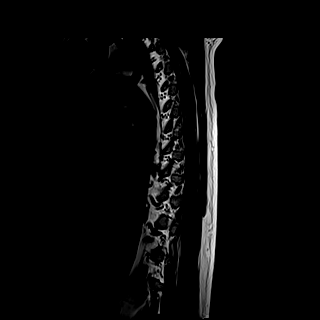
[im 4/15]
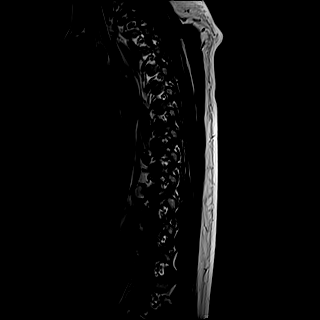
[im 6/15]
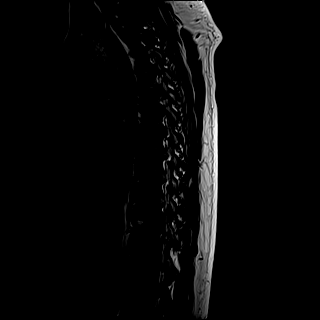
[im 8/15]
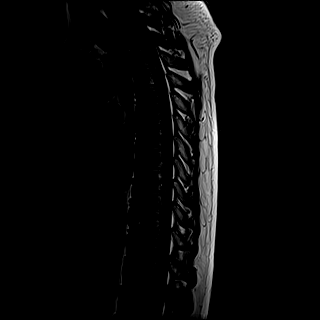
[im 9/15]
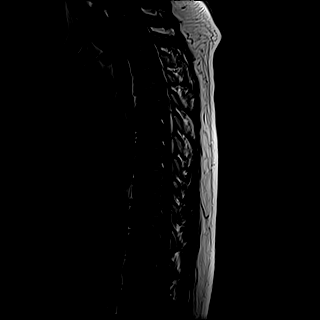
[im 11/15]
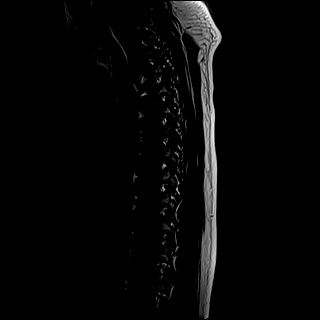
[im 13/15]
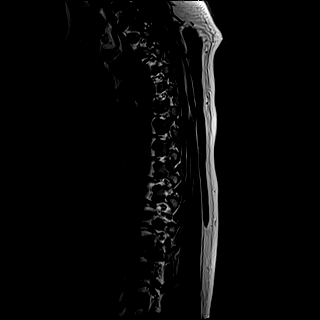
[im 15/15]
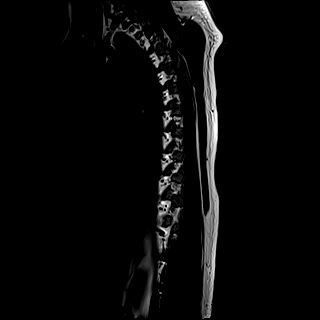

[Series 18: T2 · sagittal · 3.0mm · 0.86mm/px · 8 of 15 slices shown (1 of 2)]
[im 1/15]
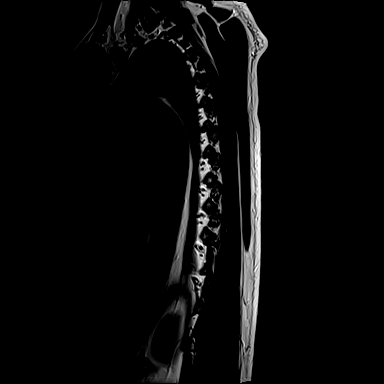
[im 2/15]
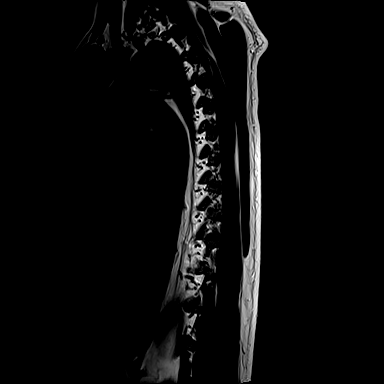
[im 5/15]
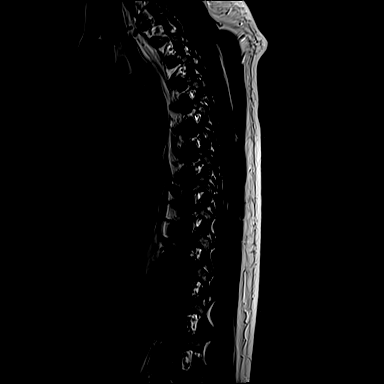
[im 7/15]
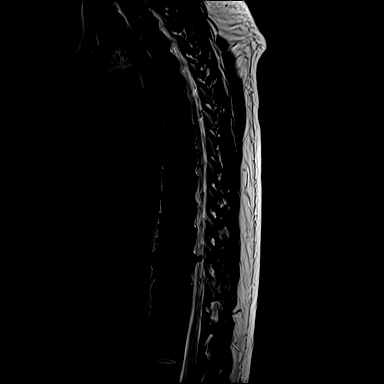
[im 8/15]
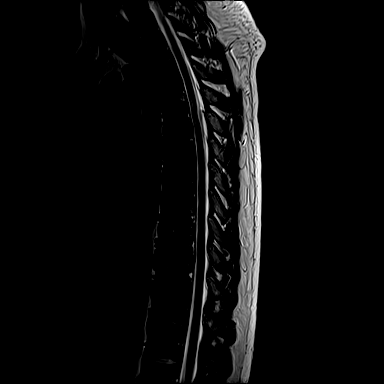
[im 10/15]
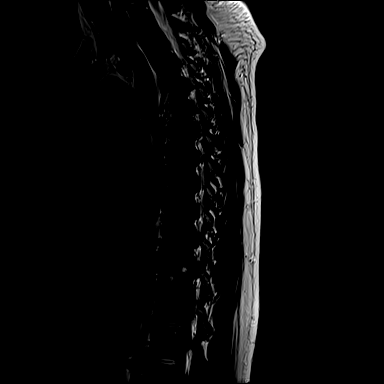
[im 13/15]
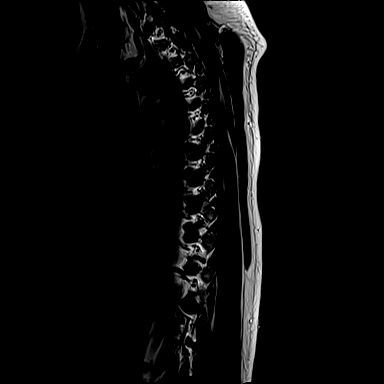
[im 15/15]
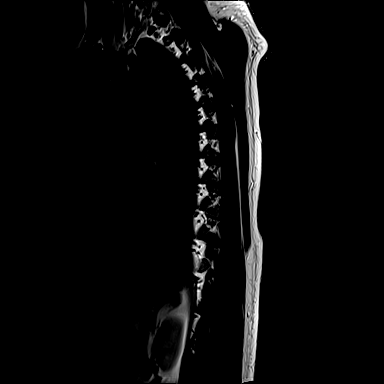

[Series 19: T2 · axial · 4.0mm · 0.78mm/px · z∈[-263,-58]mm · 8 of 15 slices shown (2 of 2)]
[im 1/15]
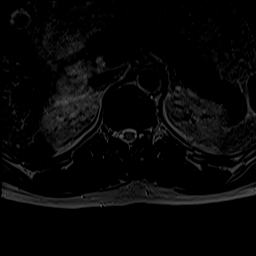
[im 2/15]
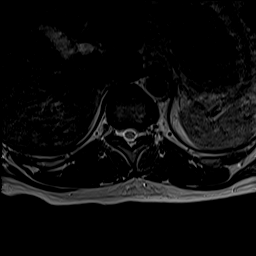
[im 5/15]
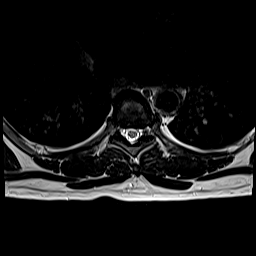
[im 7/15]
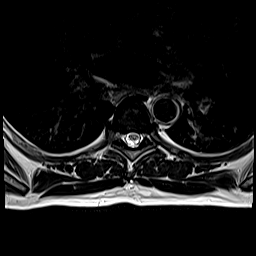
[im 8/15]
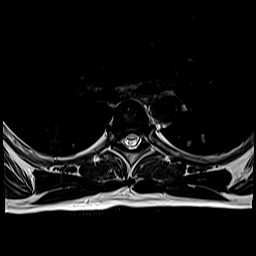
[im 10/15]
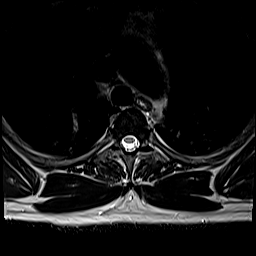
[im 13/15]
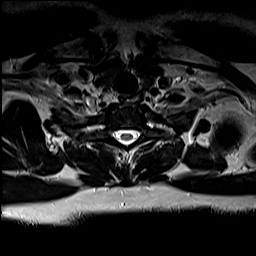
[im 15/15]
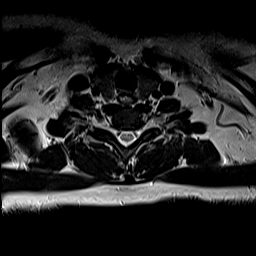

[Series 20: t2_me2d_tra · axial · 4.0mm · 0.39mm/px · z∈[-263,-136]mm · 4 of 15 slices shown]
[im 1/15]
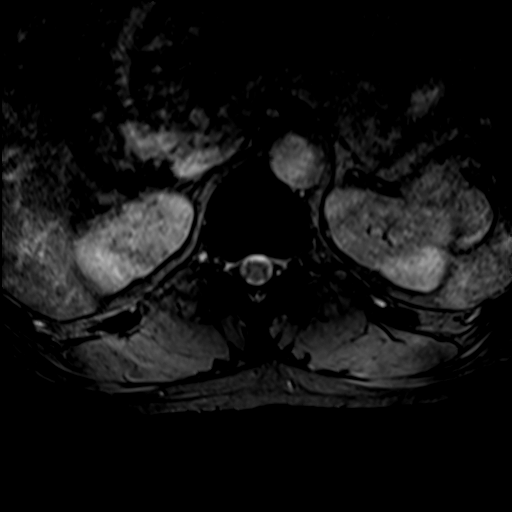
[im 2/15]
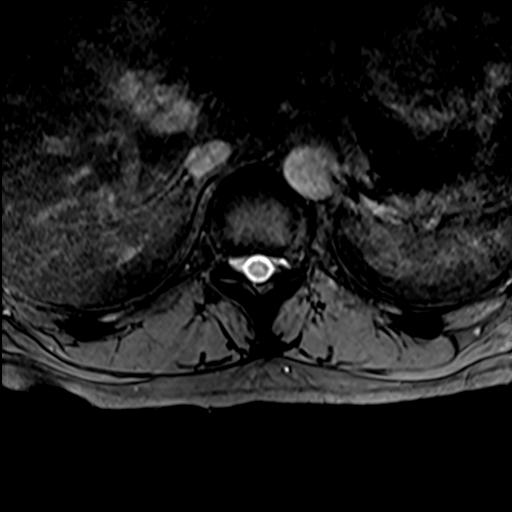
[im 5/15]
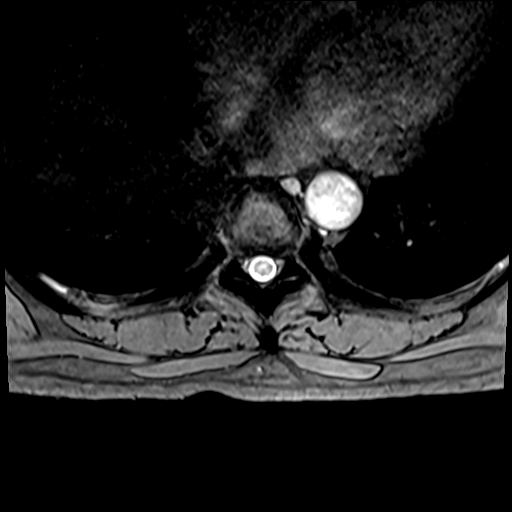
[im 7/15]
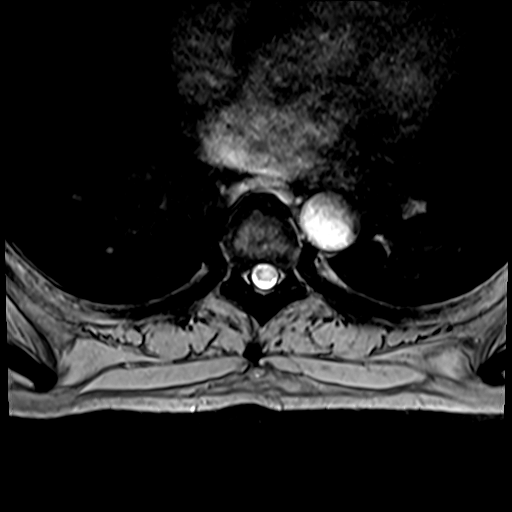

[38 of 48 positions shown; findings below may reference images not displayed]

FINDINGS: MRI THORACIC SPINE FINDINGS

Alignment:  Physiologic.

Vertebrae: No fracture, evidence of discitis, or bone lesion.

Cord:  Normal signal and morphology.

Paraspinal and other soft tissues: No perispinal mass or
inflammation

Disc levels:

Generally preserved disc height and hydration and lack of facet
spurring.

Notable left paracentral extrusion at T10-11 contacting the left
cord and presumably impinging on the intradural left T10 nerve root.
Small right paracentral to foraminal protrusion at T5-6.

MRI LUMBAR SPINE FINDINGS

Segmentation: Transitional S1 vertebra when numbered from above, see
thoracic spine counting series.

Alignment:  Unremarkable

Vertebrae:  No fracture, evidence of discitis, or bone lesion.

Conus medullaris and cauda equina: Conus extends to the L1-2 level.
Conus and cauda equina appear normal.

Paraspinal and other soft tissues: Negative for perispinal mass or
inflammation

Disc levels:

Mild annulus bulging at L3-4 and L4-5 where disc height and
hydration is well preserved.

Mild disc desiccation and narrowing at L5-S1 and S1-2 with annular
fissure and protrusion seen centrally at both levels. Disc
protrusions contact descending S1 and S2 nerve roots without neural
compression. Negative facets. Patent foramina.
IMPRESSION: MR THORACIC SPINE IMPRESSION

Focal notable left paracentral extrusion at T10-11 with presumed
intradural nerve root compression.

MR LUMBAR SPINE IMPRESSION

1. Transitional S1 vertebra when numbered from above.
2. L5-S1 and S1-2 disc degeneration with protrusions contacting but
not compressing the descending nerve roots at the subarticular
recesses.

## 2021-06-13 ENCOUNTER — Other Ambulatory Visit: Payer: Self-pay

## 2021-06-13 ENCOUNTER — Ambulatory Visit (INDEPENDENT_AMBULATORY_CARE_PROVIDER_SITE_OTHER): Payer: 59 | Admitting: Orthopaedic Surgery

## 2021-06-13 ENCOUNTER — Encounter: Payer: Self-pay | Admitting: Orthopaedic Surgery

## 2021-06-13 DIAGNOSIS — M546 Pain in thoracic spine: Secondary | ICD-10-CM | POA: Diagnosis not present

## 2021-06-13 MED ORDER — GABAPENTIN 100 MG PO CAPS: 100.0000 mg | ORAL_CAPSULE | Freq: Three times a day (TID) | ORAL | 3 refills | Status: DC

## 2021-06-13 MED ORDER — ACETAMINOPHEN-CODEINE #3 300-30 MG PO TABS: 1.0000 | ORAL_TABLET | Freq: Two times a day (BID) | ORAL | 0 refills | Status: DC | PRN

## 2021-06-13 NOTE — Progress Notes (Signed)
Office Visit Note   Patient: Jared Park.           Date of Birth: June 03, 1966           MRN: [id number] Visit Date: 06/13/2021              Requested by: [name], MD [address] [place],  [place] [address] PCP: [name], MD   Assessment & Plan: Visit Diagnoses:  1. Pain in thoracic spine     Plan: MRI of the thoracic spine shows a left paracentral extrusion at T10-11 contacting the left T10 nerve root.  Given these findings I have recommended referral to Dr. Ernestina Patches for [facility].  In the meantime we will send in a prescription for Tylenol 3 and gabapentin.  Follow-Up Instructions: No follow-ups on file.   Orders:  No orders of the defined types were placed in this encounter.  Meds ordered this encounter  Medications   acetaminophen-codeine (TYLENOL #3) 300-30 MG tablet    Sig: Take 1 tablet by mouth every 12 (twelve) hours as needed for moderate pain.    Dispense:  30 tablet    Refill:  0   gabapentin (NEURONTIN) 100 MG capsule    Sig: Take 1 capsule (100 mg total) by mouth 3 (three) times daily.    Dispense:  30 capsule    Refill:  3      Procedures: No procedures performed   Clinical Data: No additional findings.   Subjective: Chief Complaint  Patient presents with   Middle Back - Pain, Follow-up   Lower Back - Pain, Follow-up    [name] returns today for MRI review of the thoracic and lumbar spine.  He endorses pain that radiates from the mid back to the left side.  No other complaints.   Review of Systems  Constitutional: Negative.   All other systems reviewed and are negative.   Objective: Vital Signs: There were no vitals taken for this visit.  Physical Exam Vitals and nursing note reviewed.  Constitutional:      Appearance: He is well-developed.  Pulmonary:     Effort: Pulmonary effort is normal.  Abdominal:     Palpations: Abdomen is soft.  Skin:    General: Skin is warm.  Neurological:     Mental  Status: He is alert and oriented to person, place, and time.  Psychiatric:        Behavior: Behavior normal.        Thought Content: Thought content normal.        Judgment: Judgment normal.    Ortho Exam  Examination of the thoracic and lumbar spine unchanged.  Specialty Comments:  No specialty comments available.  Imaging: No results found.   PMFS History: Patient Active Problem List   Diagnosis Date Noted   Acute medial meniscus tear, right, initial encounter 02/19/2021   Synovitis of knee 02/19/2021   Erectile dysfunction 10/25/2020   Elevated LFTs 06/11/2020   DOE (dyspnea on exertion) 06/11/2020   Elevated LDL cholesterol level 03/12/2020   Cervical strain 03/12/2020   COMMON MIGRAINE 05/17/2007   Past Medical History:  Diagnosis Date   COVID 03/2020   MMT (medial meniscus tear)    right    Family History  Problem Relation Age of Onset   Diabetes Mother    Hyperlipidemia Mother    Hypertension Mother    Stroke Father    Healthy Sister    Healthy Brother    Colon  polyps Neg Hx    Colon cancer Neg Hx    Esophageal cancer Neg Hx    Rectal cancer Neg Hx    Stomach cancer Neg Hx     Past Surgical History:  Procedure Laterality Date   KNEE ARTHROSCOPY WITH MEDIAL MENISECTOMY Right 02/19/2021   Procedure: RIGHT KNEE ARTHROSCOPY WITH PARTIAL MEDIAL MENISECTOMY;  Surgeon: Leandrew Koyanagi, MD;  Location: [facility];  Service: Orthopedics;  Laterality: Right;   WISDOM TOOTH EXTRACTION     age 55   Social History   Occupational History   Not on file  Tobacco Use   Smoking status: Never   Smokeless tobacco: Never  Vaping Use   Vaping Use: Never used  Substance and Sexual Activity   Alcohol use: Not Currently    Comment: none since 03-2020   Drug use: No   Sexual activity: Yes

## 2021-06-13 NOTE — Addendum Note (Signed)
Addended by: Lendon Collar on: 06/13/2021 08:41 AM   Modules accepted: Orders

## 2021-06-14 ENCOUNTER — Other Ambulatory Visit: Payer: 59

## 2021-06-18 ENCOUNTER — Encounter: Payer: Self-pay | Admitting: Physical Medicine and Rehabilitation

## 2021-06-18 ENCOUNTER — Ambulatory Visit (INDEPENDENT_AMBULATORY_CARE_PROVIDER_SITE_OTHER): Payer: 59 | Admitting: Physical Medicine and Rehabilitation

## 2021-06-18 ENCOUNTER — Other Ambulatory Visit: Payer: Self-pay

## 2021-06-18 VITALS — BP 123/79 | HR 50

## 2021-06-18 DIAGNOSIS — R208 Other disturbances of skin sensation: Secondary | ICD-10-CM | POA: Diagnosis not present

## 2021-06-18 DIAGNOSIS — M5414 Radiculopathy, thoracic region: Secondary | ICD-10-CM | POA: Diagnosis not present

## 2021-06-18 DIAGNOSIS — M546 Pain in thoracic spine: Secondary | ICD-10-CM | POA: Diagnosis not present

## 2021-06-18 NOTE — Progress Notes (Signed)
Left sided low back pain. Pain radiates around side to abdomen. States that he is starting to feel better and can move more if he takes his pain medication. Also takes Gabapentin.  Numeric Pain Rating Scale and Functional Assessment Average Pain 10 Pain Right Now 4 My pain is intermittent, dull, and aching Pain is worse with: some activites Pain improves with: rest   In the last MONTH (on 0-10 scale) has pain interfered with the following?  1. General activity like being  able to carry out your everyday physical activities such as walking, climbing stairs, carrying groceries, or moving a chair?  Rating(10)  2. Relation with others like being able to carry out your usual social activities and roles such as  activities at home, at work and in your community. Rating(10)  3. Enjoyment of life such that you have  been bothered by emotional problems such as feeling anxious, depressed or irritable?  Rating(1)

## 2021-06-18 NOTE — Progress Notes (Signed)
[place] - 55 y.o. male MRN [id number]  Date of birth: August 25, 1966  Office Visit Note: Visit Date: 06/18/2021 PCP: [name], MD Referred by: [name],*  Subjective: Chief Complaint  Patient presents with   Lower Back - Pain   HPI: Jared Labelle. is a 55 y.o. male who comes in today  per the request of Dr. Eduard Roux for evaluation of chronic, worsening and severe left sided thoracic back pain that wraps around to left rib cage. Patient reports chronic pain ongoing for several months.  This was initially treated as a muscle strain ultimately did not respond to conservative care.  Patient reports pain is exacerbated by movement and activity, describes as sharp and shooting sensation, currently rates as 4 out of 10. Patient reports some pain relief with rest, Tylenol and Gabapentin. Patient's recent thoracic MRI exhibits left paracentral disc extrusion at T10-T11 with probable impingement of left T10 nerve root. Patient states he works for [employer] and has to lift heavy equipment frequently which he reports has made his pain worse. Patient states he has not been able to work for several weeks due to worsening pain. Patient denies focal weakness, numbness and tingling. Patient denies recent trauma or falls.   Patient had right knee arthroscopy with partial medial menisectomy in June by Dr. Eduard Roux. Patient states he is doing well since surgery.   Review of Systems  Musculoskeletal:  Positive for back pain and myalgias.  Neurological:  Negative for tingling, sensory change, focal weakness and weakness.  All other systems reviewed and are negative. Otherwise per HPI.  Assessment & Plan: Visit Diagnoses:    ICD-10-CM   1. Thoracic radiculopathy  M54.14 Ambulatory referral to Physical Medicine Rehab    2. Pain in thoracic spine  M54.6     3. 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  Patient continues to have excruciating pain despite all conservative therapies such as rest and medications.  Patient clinical presentation and exam are consistent with classic T10 nerve pattern. Patient's recent thoracic MRI does show a left paracentral extrusion at T10-T11 possibly causing T10 nerve root compression.  We discussed patient's thoracic MRI in detail today using images and spine model.  We feel the next step is to perform a diagnostic and hopefully therapeutic left T11-T12 interlaminar epidural steroid injection under fluoroscopic guidance.  We also discussed possible referral for physical therapy with patient if he continues to have pain after the epidural injection.  No red flag symptoms noted today upon exam.   Meds & Orders: No orders of the defined types were placed in this encounter.   Orders Placed This Encounter  Procedures   Ambulatory referral to Physical Medicine Rehab    Follow-up: Return in about 1 week (around 06/25/2021) for Left T11-T12 interlaminar epidural steroid injection.   Procedures: No procedures performed      Clinical History: MRI THORACIC SPINE FINDINGS   Alignment:  Physiologic.   Vertebrae: No fracture, evidence of discitis, or bone lesion.   Cord:  Normal signal and morphology.   Paraspinal and other soft tissues: No perispinal mass or inflammation   Disc levels:   Generally preserved disc height and hydration and lack of facet spurring.   Notable left paracentral extrusion at T10-11 contacting the left cord and presumably impinging on the  intradural left T10 nerve root. Small right paracentral to foraminal protrusion at T5-6.   MRI LUMBAR SPINE FINDINGS   Segmentation: Transitional S1 vertebra when numbered from above, see thoracic spine counting series.   Alignment:  Unremarkable   Vertebrae:  No fracture, evidence of discitis, or bone lesion.   Conus medullaris and cauda equina:  Conus extends to the L1-2 level. Conus and cauda equina appear normal.   Paraspinal and other soft tissues: Negative for perispinal mass or inflammation   Disc levels:   Mild annulus bulging at L3-4 and L4-5 where disc height and hydration is well preserved.   Mild disc desiccation and narrowing at L5-S1 and S1-2 with annular fissure and protrusion seen centrally at both levels. Disc protrusions contact descending S1 and S2 nerve roots without neural compression. Negative facets. Patent foramina.   IMPRESSION: MR THORACIC SPINE IMPRESSION   Focal notable left paracentral extrusion at T10-11 with presumed intradural nerve root compression.   MR LUMBAR SPINE IMPRESSION   1. 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 2. L5-S1 and S1-2 disc degeneration with protrusions contacting but not compressing the descending nerve roots at the subarticular recesses.     Electronically Signed   By: Jorje Guild M.D.   On: 06/12/2021 09:45   He reports that he has never smoked. He has never used smokeless tobacco. No results for input(s): HGBA1C, LABURIC in the last 8760 hours.  Objective:  VS:  HT:    WT:   BMI:     BP:123/79  HR:(!) 50bpm  TEMP: ( )  RESP:  Physical Exam Vitals and nursing note reviewed.  HENT:     Head: Normocephalic and atraumatic.     Right Ear: External ear normal.     Left Ear: External ear normal.     Nose: Nose normal.     Mouth/Throat:     Mouth: Mucous membranes are moist.  Eyes:     Extraocular Movements: Extraocular movements intact.  Cardiovascular:     Rate and Rhythm: Normal rate.     Pulses: Normal pulses.  Pulmonary:     Effort: Pulmonary effort is normal.  Abdominal:     General: Abdomen is flat. There is no distension.  Musculoskeletal:        General: Tenderness present.     Cervical back: Normal range of motion.     Comments: Tenderness noted upon palpation of left paraspinal region of thoracic spine. Dysesthesias noted  to left T10 dermatome. Good strength noted to bilateral upper extremities.   Skin:    General: Skin is warm and dry.     Capillary Refill: Capillary refill takes less than 2 seconds.  Neurological:     General: No focal deficit present.     Mental Status: He is alert.  Psychiatric:        Mood and Affect: Mood normal.    Ortho Exam  Imaging: No results found.  Past Medical/Family/Surgical/Social History: Medications & Allergies reviewed per EMR, new medications updated. Patient Active Problem List   Diagnosis Date Noted   Acute medial meniscus tear, right, initial encounter 02/19/2021   Synovitis of knee 02/19/2021   Erectile dysfunction 10/25/2020   Elevated LFTs 06/11/2020   DOE (dyspnea on exertion) 06/11/2020   Elevated LDL cholesterol level 03/12/2020   Cervical strain 03/12/2020   COMMON MIGRAINE 05/17/2007   Past Medical History:  Diagnosis Date   COVID 03/2020   MMT (medial meniscus tear)    right   Family  History  Problem Relation Age of Onset   Diabetes Mother    Hyperlipidemia Mother    Hypertension Mother    Stroke Father    Healthy Sister    Healthy Brother    Colon polyps Neg Hx    Colon cancer Neg Hx    Esophageal cancer Neg Hx    Rectal cancer Neg Hx    Stomach cancer Neg Hx    Past Surgical History:  Procedure Laterality Date   KNEE ARTHROSCOPY WITH MEDIAL MENISECTOMY Right 02/19/2021   Procedure: RIGHT KNEE ARTHROSCOPY WITH PARTIAL MEDIAL MENISECTOMY;  Surgeon: Leandrew Koyanagi, MD;  Location: [facility];  Service: Orthopedics;  Laterality: Right;   WISDOM TOOTH EXTRACTION     age 54   Social History   Occupational History   Not on file  Tobacco Use   Smoking status: Never   Smokeless tobacco: Never  Vaping Use   Vaping Use: Never used  Substance and Sexual Activity   Alcohol use: Not Currently    Comment: none since 03-2020   Drug use: No   Sexual activity: Yes

## 2021-07-03 ENCOUNTER — Ambulatory Visit (INDEPENDENT_AMBULATORY_CARE_PROVIDER_SITE_OTHER): Payer: 59 | Admitting: Physical Medicine and Rehabilitation

## 2021-07-03 ENCOUNTER — Ambulatory Visit: Payer: Self-pay

## 2021-07-03 ENCOUNTER — Encounter: Payer: Self-pay | Admitting: Physical Medicine and Rehabilitation

## 2021-07-03 ENCOUNTER — Other Ambulatory Visit: Payer: Self-pay

## 2021-07-03 VITALS — BP 127/85 | HR 57

## 2021-07-03 DIAGNOSIS — M5416 Radiculopathy, lumbar region: Secondary | ICD-10-CM | POA: Diagnosis not present

## 2021-07-03 MED ORDER — BETAMETHASONE SOD PHOS & ACET 6 (3-3) MG/ML IJ SUSP
12.0000 mg | Freq: Once | INTRAMUSCULAR | Status: AC
  Administered 2021-07-03: 12 mg

## 2021-07-03 NOTE — Patient Instructions (Signed)

## 2021-07-03 NOTE — Progress Notes (Signed)
Pt state lower back pain that travels to is left abdomen. Pt state bending makes the pain worse. Pt state he takes pain meds to help ease his pain.  Numeric Pain Rating Scale and Functional Assessment Average Pain 5   In the last MONTH (on 0-10 scale) has pain interfered with the following?  1. General activity like being  able to carry out your everyday physical activities such as walking, climbing stairs, carrying groceries, or moving a chair?  Rating(10)   +Driver, -BT, -Dye Allergies.

## 2021-07-13 NOTE — Procedures (Signed)
Thoracic epidural Steroid Injection - Interlaminar Approach with Fluoroscopic Guidance  Patient: Jared Park.      Date of Birth: 05-May-1966 MRN: [id number] PCP: [name], MD      Visit Date: 07/03/2021   Universal Protocol:     Consent Given By: the patient  Position: PRONE  Additional Comments: Vital signs were monitored before and after the procedure. Patient was prepped and draped in the usual sterile fashion. The correct patient, procedure, and site was verified.   Injection Procedure Details:   Procedure diagnoses: Lumbar radiculopathy [M54.16]   Meds Administered:  Meds ordered this encounter  Medications   betamethasone acetate-betamethasone sodium phosphate (CELESTONE) injection 12 mg     Laterality: Left  Location/Site:  T11-12  Needle: 3.5 in., 20 ga. Tuohy  Needle Placement: Paramedian epidural  Findings:   -Comments: Excellent flow of contrast into the epidural space.  Procedure Details: Using a paramedian approach from the side mentioned above, the region overlying the inferior lamina was localized under fluoroscopic visualization and the soft tissues overlying this structure were infiltrated with 4 ml. of 1% Lidocaine without Epinephrine. The Tuohy needle was inserted into the epidural space using a paramedian approach.   The epidural space was localized using loss of resistance along with counter oblique bi-planar fluoroscopic views.  After negative aspirate for air, blood, and CSF, a 2 ml. volume of Isovue-250 was injected into the epidural space and the flow of contrast was observed. Radiographs were obtained for documentation purposes.    The injectate was administered into the level noted above.   Additional Comments:  The patient tolerated the procedure well Dressing: 2 x 2 sterile gauze and Band-Aid    Post-procedure details: Patient was observed during the procedure. Post-procedure instructions were reviewed.  Patient  left the clinic in stable condition.

## 2021-07-13 NOTE — Progress Notes (Signed)
Jared Park MRN [id number]  Date of birth: [birth year]  Office Visit Note: Visit Date: 07/03/2021 PCP: [name], MD Referred by: [name],*  Subjective: Chief Complaint  Patient presents with   Lower Back - Pain   HPI:  Jared Riegler. is a 55 y.o. Park who comes in today at the request of Barnet Pall, FNP for planned Left T11-12 Thoracic Interlaminar epidural steroid injection with fluoroscopic guidance.  The patient has failed conservative care including home exercise, medications, time and activity modification.  This injection will be diagnostic and hopefully therapeutic.  Please see requesting physician notes for further details and justification. MRI reviewed with images and spine model.  MRI reviewed in the note below.   ROS Otherwise per HPI.  Assessment & Plan: Visit Diagnoses:    ICD-10-CM   1. Lumbar radiculopathy  M54.16 XR C-ARM NO REPORT    Epidural Steroid injection    betamethasone acetate-betamethasone sodium phosphate (CELESTONE) injection 12 mg      Plan: No additional findings.   Meds & Orders:  Meds ordered this encounter  Medications   betamethasone acetate-betamethasone sodium phosphate (CELESTONE) injection 12 mg    Orders Placed This Encounter  Procedures   XR C-ARM NO REPORT   Epidural Steroid injection    Follow-up: Return if symptoms worsen or fail to improve.   Procedures: No procedures performed  Thoracic epidural Steroid Injection - Interlaminar Approach with Fluoroscopic Guidance  Patient: Jared Park.      Date of Birth: December 21, 1965 MRN: [id number] PCP: [name], MD      Visit Date: 07/03/2021   Universal Protocol:     Consent Given By: the patient  Position: PRONE  Additional Comments: Vital signs were monitored before and after the procedure. Patient was prepped and draped in the usual sterile fashion. The correct patient, procedure, and site was  verified.   Injection Procedure Details:   Procedure diagnoses: Lumbar radiculopathy [M54.16]   Meds Administered:  Meds ordered this encounter  Medications   betamethasone acetate-betamethasone sodium phosphate (CELESTONE) injection 12 mg     Laterality: Left  Location/Site:  T11-12  Needle: 3.5 in., 20 ga. Tuohy  Needle Placement: Paramedian epidural  Findings:   -Comments: Excellent flow of contrast into the epidural space.  Procedure Details: Using a paramedian approach from the side mentioned above, the region overlying the inferior lamina was localized under fluoroscopic visualization and the soft tissues overlying this structure were infiltrated with 4 ml. of 1% Lidocaine without Epinephrine. The Tuohy needle was inserted into the epidural space using a paramedian approach.   The epidural space was localized using loss of resistance along with counter oblique bi-planar fluoroscopic views.  After negative aspirate for air, blood, and CSF, a 2 ml. volume of Isovue-250 was injected into the epidural space and the flow of contrast was observed. Radiographs were obtained for documentation purposes.    The injectate was administered into the level noted above.   Additional Comments:  The patient tolerated the procedure well Dressing: 2 x 2 sterile gauze and Band-Aid    Post-procedure details: Patient was observed during the procedure. Post-procedure instructions were reviewed.  Patient left the clinic in stable condition.    Clinical History: MRI THORACIC SPINE FINDINGS   Alignment:  Physiologic.   Vertebrae: No fracture, evidence of discitis, or bone lesion.   Cord:  Normal signal and morphology.   Paraspinal and other soft tissues:  No perispinal mass or inflammation   Disc levels:   Generally preserved disc height and hydration and lack of facet spurring.   Notable left paracentral extrusion at T10-11 contacting the left cord and presumably impinging on  the intradural left T10 nerve root. Small right paracentral to foraminal protrusion at T5-6.   MRI LUMBAR SPINE FINDINGS   Segmentation: Transitional S1 vertebra when numbered from above, see thoracic spine counting series.   Alignment:  Unremarkable   Vertebrae:  No fracture, evidence of discitis, or bone lesion.   Conus medullaris and cauda equina: Conus extends to the L1-2 level. Conus and cauda equina appear normal.   Paraspinal and other soft tissues: Negative for perispinal mass or inflammation   Disc levels:   Mild annulus bulging at L3-4 and L4-5 where disc height and hydration is well preserved.   Mild disc desiccation and narrowing at L5-S1 and S1-2 with annular fissure and protrusion seen centrally at both levels. Disc protrusions contact descending S1 and S2 nerve roots without neural compression. Negative facets. Patent foramina.   IMPRESSION: MR THORACIC SPINE IMPRESSION   Focal notable left paracentral extrusion at T10-11 with presumed intradural nerve root compression.   MR LUMBAR SPINE IMPRESSION   1. 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 2. L5-S1 and S1-2 disc degeneration with protrusions contacting but not compressing the descending nerve roots at the subarticular recesses.     Electronically Signed   By: Jorje Guild M.D.   On: 06/12/2021 09:45     Objective:  VS:  HT:    WT:   BMI:     BP:127/85  HR:(!) 57bpm  TEMP: ( )  RESP:  Physical Exam Vitals and nursing note reviewed.  Constitutional:      General: He is not in acute distress.    Appearance: Normal appearance. He is not ill-appearing.  HENT:     Head: Normocephalic and atraumatic.     Right Ear: External ear normal.     Left Ear: External ear normal.     Nose: No congestion.  Eyes:     Extraocular Movements: Extraocular movements intact.  Cardiovascular:     Rate and Rhythm: Normal rate.     Pulses: Normal pulses.  Pulmonary:     Effort: Pulmonary  effort is normal. No respiratory distress.  Abdominal:     General: There is no distension.     Palpations: Abdomen is soft.  Musculoskeletal:        General: No tenderness or signs of injury.     Cervical back: Neck supple.     Right lower leg: No edema.     Left lower leg: No edema.     Comments: Patient has good distal strength without clonus.  Skin:    Findings: No erythema or rash.  Neurological:     General: No focal deficit present.     Mental Status: He is alert and oriented to person, place, and time.     Sensory: No sensory deficit.     Motor: No weakness or abnormal muscle tone.     Coordination: Coordination normal.  Psychiatric:        Mood and Affect: Mood normal.        Behavior: Behavior normal.     Imaging: No results found.

## 2021-07-21 ENCOUNTER — Other Ambulatory Visit: Payer: Self-pay

## 2021-07-22 ENCOUNTER — Encounter: Payer: Self-pay | Admitting: Family Medicine

## 2021-07-22 ENCOUNTER — Ambulatory Visit (INDEPENDENT_AMBULATORY_CARE_PROVIDER_SITE_OTHER): Payer: 59 | Admitting: Family Medicine

## 2021-07-22 ENCOUNTER — Ambulatory Visit (INDEPENDENT_AMBULATORY_CARE_PROVIDER_SITE_OTHER): Payer: 59

## 2021-07-22 VITALS — BP 110/70 | HR 60 | Temp 97.7°F | Ht 66.0 in | Wt 171.0 lb

## 2021-07-22 DIAGNOSIS — E7800 Pure hypercholesterolemia, unspecified: Secondary | ICD-10-CM | POA: Diagnosis not present

## 2021-07-22 DIAGNOSIS — R109 Unspecified abdominal pain: Secondary | ICD-10-CM | POA: Insufficient documentation

## 2021-07-22 DIAGNOSIS — Z0000 Encounter for general adult medical examination without abnormal findings: Secondary | ICD-10-CM

## 2021-07-22 DIAGNOSIS — R911 Solitary pulmonary nodule: Secondary | ICD-10-CM

## 2021-07-22 LAB — LIPID PANEL
Cholesterol: 241 mg/dL — ABNORMAL HIGH (ref 0–200)
HDL: 55.2 mg/dL (ref >39.00)
LDL Cholesterol: 175 mg/dL — ABNORMAL HIGH (ref 0–99)
NonHDL: 185.95
Total CHOL/HDL Ratio: 4
Triglycerides: 55 mg/dL (ref 0.0–149.0)
VLDL: 11 mg/dL (ref 0.0–40.0)

## 2021-07-22 LAB — URINALYSIS, ROUTINE W REFLEX MICROSCOPIC
Bilirubin Urine: NEGATIVE
Hgb urine dipstick: NEGATIVE
Ketones, ur: NEGATIVE
Leukocytes,Ua: NEGATIVE
Nitrite: NEGATIVE
RBC / HPF: NONE SEEN (ref 0–?)
Specific Gravity, Urine: 1.02 (ref 1.000–1.030)
Total Protein, Urine: NEGATIVE
Urine Glucose: NEGATIVE
Urobilinogen, UA: 0.2 (ref 0.0–1.0)
WBC, UA: NONE SEEN (ref 0–?)
pH: 7 (ref 5.0–8.0)

## 2021-07-22 LAB — COMPREHENSIVE METABOLIC PANEL
ALT: 10 U/L (ref 0–53)
AST: 12 U/L (ref 0–37)
Albumin: 4.1 g/dL (ref 3.5–5.2)
Alkaline Phosphatase: 64 U/L (ref 39–117)
BUN: 11 mg/dL (ref 6–23)
CO2: 31 mEq/L (ref 19–32)
Calcium: 9 mg/dL (ref 8.4–10.5)
Chloride: 103 mEq/L (ref 96–112)
Creatinine, Ser: 0.99 mg/dL (ref 0.40–1.50)
GFR: 87.22 mL/min (ref >60.00)
Glucose, Bld: 86 mg/dL (ref 70–99)
Potassium: 4.1 mEq/L (ref 3.5–5.1)
Sodium: 141 mEq/L (ref 135–145)
Total Bilirubin: 0.8 mg/dL (ref 0.2–1.2)
Total Protein: 6.8 g/dL (ref 6.0–8.3)

## 2021-07-22 LAB — CBC
HCT: 42 % (ref 39.0–52.0)
Hemoglobin: 13.3 g/dL (ref 13.0–17.0)
MCHC: 31.7 g/dL (ref 30.0–36.0)
MCV: 86.8 fl (ref 78.0–100.0)
Platelets: 278 10*3/uL (ref 150.0–400.0)
RBC: 4.84 Mil/uL (ref 4.22–5.81)
RDW: 14.1 % (ref 11.5–15.5)
WBC: 3.5 10*3/uL — ABNORMAL LOW (ref 4.0–10.5)

## 2021-07-22 LAB — LDL CHOLESTEROL, DIRECT: Direct LDL: 159 mg/dL

## 2021-07-22 LAB — PSA: PSA: 0.46 ng/mL (ref 0.10–4.00)

## 2021-07-22 IMAGING — DX DG CHEST 2V
2 series · 2 of 2 positions shown · non-contrast
Comparison: [DATE].

CLINICAL DATA: Left flank pain and notes worse with certain
movements.

EXAM:
CHEST - 2 VIEW

[chest pa]
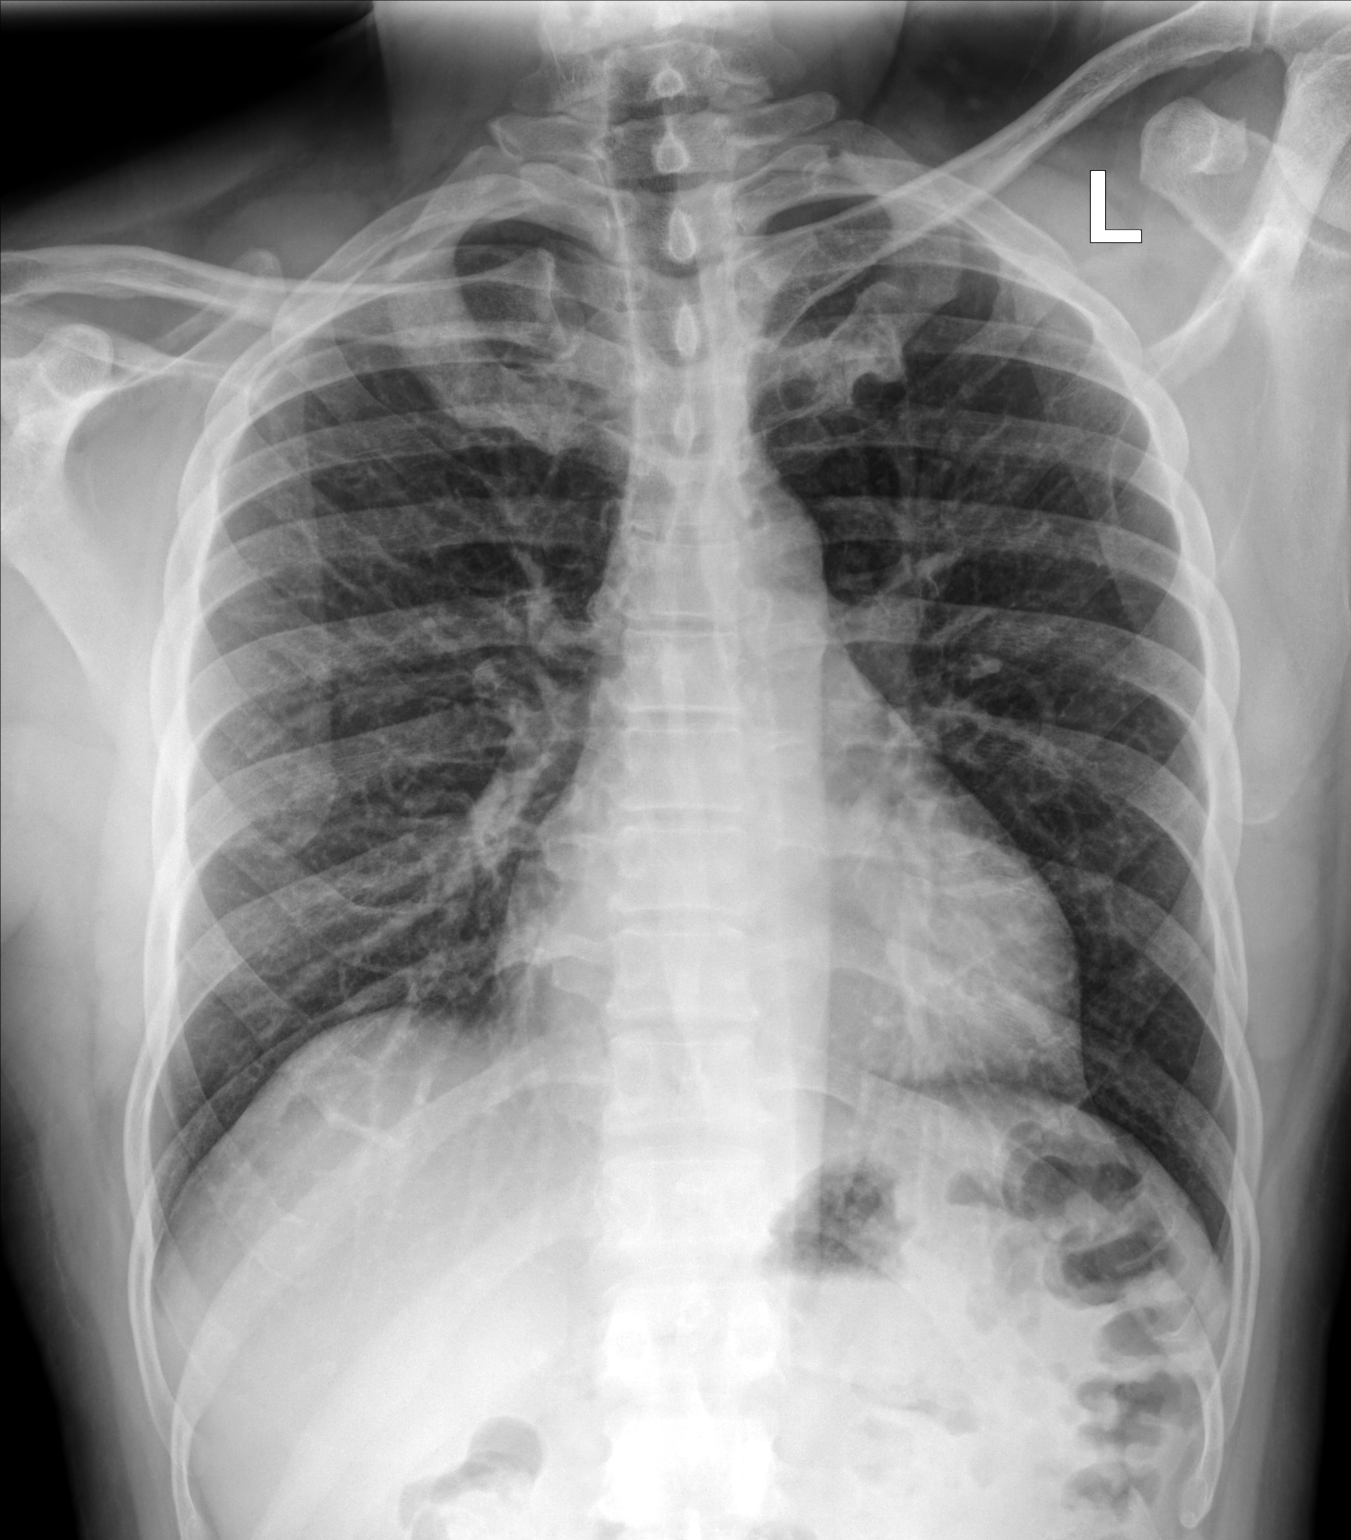

[chest lat]
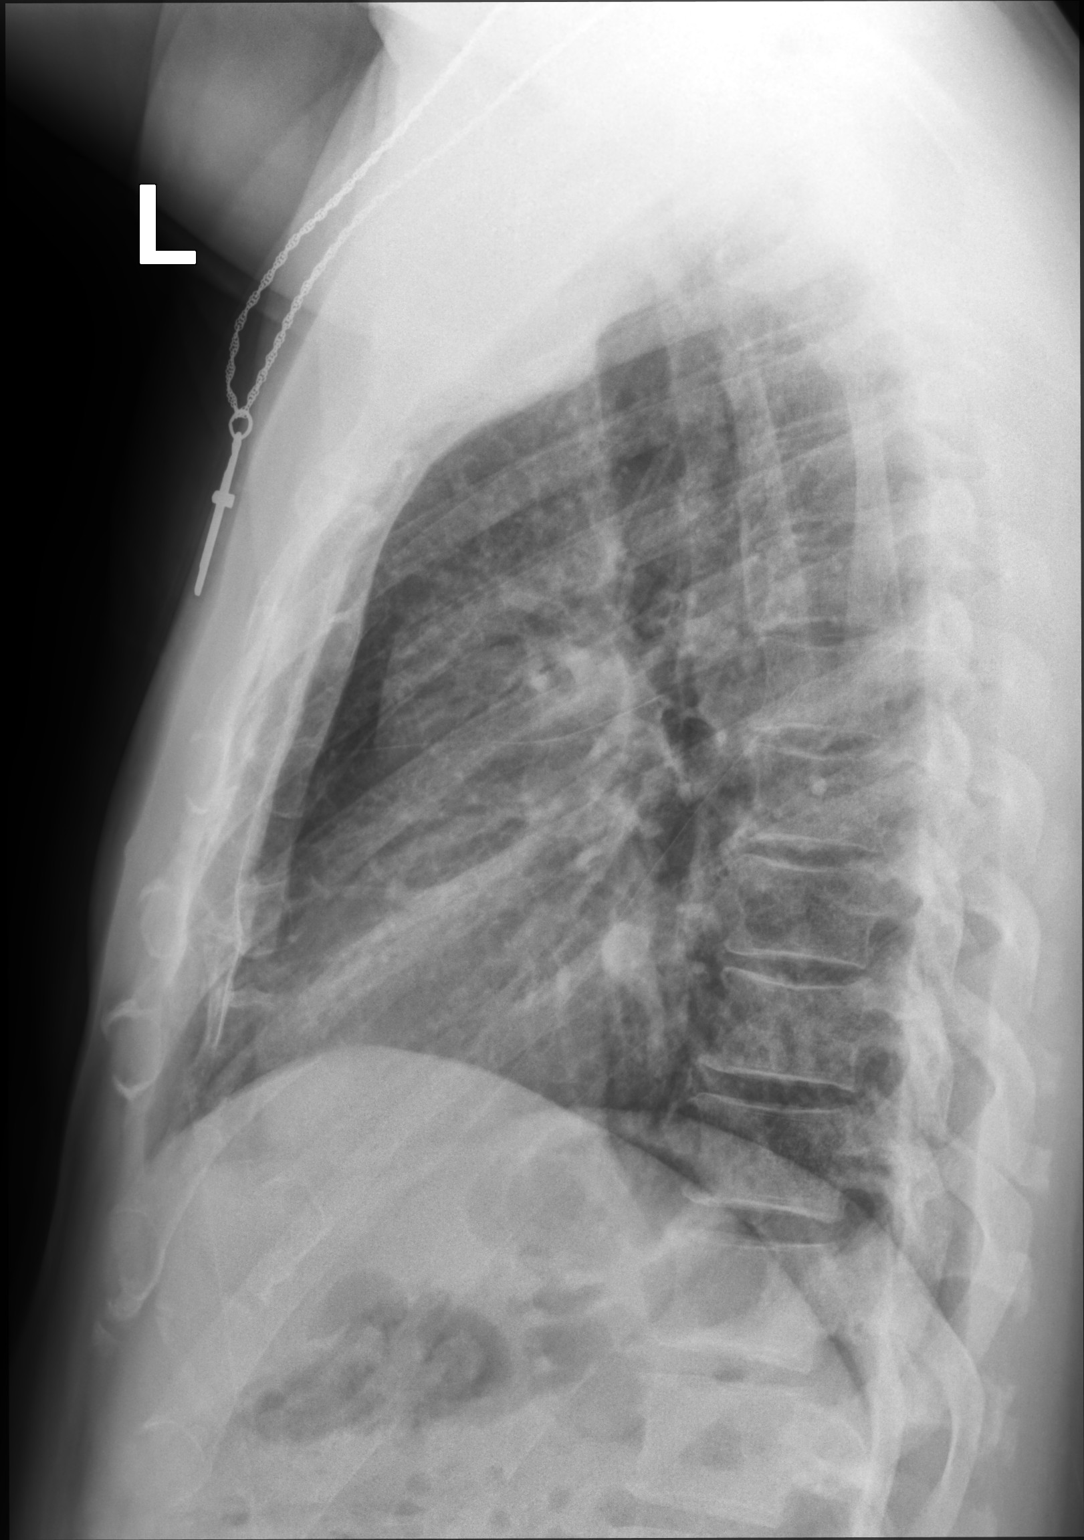

[2 of 2 positions shown; findings below may reference images not displayed]

FINDINGS: On the lateral view, there is a rounded density probably within the
left lower lobe along the oblique fissure. This is also likely
present on recent thoracic radiograph and possibly increased in size
from [7X] chest radiograph. No consolidation. No pleural effusion.
Normal heart size. No acute osseous abnormality.
IMPRESSION: No acute abnormality in the chest.

Question of a lower lobe nodule on the lateral view. Chest CT is
recommended for further evaluation.

## 2021-07-22 NOTE — Progress Notes (Addendum)
Established Patient Office Visit  Subjective:  Patient ID: Jared Axelson., male    DOB: 1966/01/03  Age: 55 y.o. MRN: [id number]  CC:  Chief Complaint  Patient presents with   Annual Exam    CPE, concerns about continued back pains. Patient fasting for labs.     HPI Jared S Coppola Jr. presents for a physical exam and he is fasting.  Mostly doing okay.  He is dealing with chronic aches and pains in his back.  Currently the pain is in his left flank area.  It is worse with certain movements.  There was no injury.  The pain has radiated around his chest to the front.  MRI of thoracic spine in October had noted a T10-T11 disc protrusion to the left.  Recently received an epidural injection that did not help.  Currently under the care of orthopedics for this issue.  Urine flow has been good no problems with bowel function.  He has stopped drinking alcohol for over a year now.  Past Medical History:  Diagnosis Date   COVID 03/2020   MMT (medial meniscus tear)    right    Past Surgical History:  Procedure Laterality Date   KNEE ARTHROSCOPY WITH MEDIAL MENISECTOMY Right 02/19/2021   Procedure: RIGHT KNEE ARTHROSCOPY WITH PARTIAL MEDIAL MENISECTOMY;  Surgeon: Leandrew Koyanagi, MD;  Location: [facility];  Service: Orthopedics;  Laterality: Right;   WISDOM TOOTH EXTRACTION     age 65    Family History  Problem Relation Age of Onset   Diabetes Mother    Hyperlipidemia Mother    Hypertension Mother    Stroke Father    Healthy Sister    Healthy Brother    Colon polyps Neg Hx    Colon cancer Neg Hx    Esophageal cancer Neg Hx    Rectal cancer Neg Hx    Stomach cancer Neg Hx     Social History   Socioeconomic History   Marital status: Married    Spouse name: Not on file   Number of children: Not on file   Years of education: Not on file   Highest education level: Not on file  Occupational History   Not on file  Tobacco Use   Smoking status: Never   Smokeless tobacco: Never   Vaping Use   Vaping Use: Never used  Substance and Sexual Activity   Alcohol use: Not Currently    Comment: none since 03-2020   Drug use: No   Sexual activity: Yes  Other Topics Concern   Not on file  Social History Narrative   Not on file   Social Determinants of Health   Financial Resource Strain: Not on file  Food Insecurity: Not on file  Transportation Needs: Not on file  Physical Activity: Not on file  Stress: Not on file  Social Connections: Not on file  Intimate Partner Violence: Not on file    Outpatient Medications Prior to Visit  Medication Sig Dispense Refill   tadalafil (CIALIS) 20 MG tablet Take 0.5-1 tablets (10-20 mg total) by mouth every other day as needed for erectile dysfunction. 5 tablet 11   acetaminophen-codeine (TYLENOL #3) 300-30 MG tablet Take 1 tablet by mouth every 12 (twelve) hours as needed for moderate pain. 30 tablet 0   cyclobenzaprine (FLEXERIL) 10 MG tablet Take 1 tablet (10 mg total) by mouth 2 (two) times daily as needed for muscle spasms. (Patient not taking: Reported on 07/22/2021) 30 tablet 0  diclofenac (VOLTAREN) 75 MG EC tablet Take 1 tablet (75 mg total) by mouth 2 (two) times daily as needed. (Patient not taking: Reported on 07/22/2021) 60 tablet 2   gabapentin (NEURONTIN) 100 MG capsule Take 1 capsule (100 mg total) by mouth 3 (three) times daily. (Patient not taking: Reported on 07/22/2021) 30 capsule 3   ibuprofen (ADVIL) 200 MG tablet Take 400 mg by mouth every 6 (six) hours as needed for headache or moderate pain. (Patient not taking: Reported on 07/22/2021)     predniSONE (STERAPRED UNI-PAK 21 TAB) 5 MG (21) TBPK tablet Take as directed (Patient not taking: Reported on 07/22/2021) 21 tablet 0   tiZANidine (ZANAFLEX) 4 MG tablet Take 1 tablet (4 mg total) by mouth 2 (two) times daily as needed for muscle spasms. (Patient not taking: Reported on 07/22/2021) 30 tablet 0   No facility-administered medications prior to visit.    No  Known Allergies  ROS Review of Systems  Constitutional:  Negative for diaphoresis, fatigue, fever and unexpected weight change.  HENT: Negative.    Eyes:  Negative for photophobia and visual disturbance.  Respiratory:  Negative for apnea, cough, chest tightness and wheezing.   Cardiovascular: Negative.   Gastrointestinal:  Negative for abdominal pain, anal bleeding, blood in stool, constipation and diarrhea.  Endocrine: Negative for polyphagia and polyuria.  Genitourinary:  Negative for difficulty urinating, frequency and urgency.  Musculoskeletal:  Positive for back pain.  Skin:  Negative for color change and pallor.  Neurological:  Negative for speech difficulty and weakness.  Hematological:  Does not bruise/bleed easily.     Depression screen [facility] 2/9 07/22/2021 07/22/2021 02/23/2018  Decreased Interest 0 0 0  Down, Depressed, Hopeless 0 0 0  PHQ - 2 Score 0 0 0  Altered sleeping 0 - -  Tired, decreased energy 0 - -  Change in appetite 0 - -  Feeling bad or failure about yourself  0 - -  Trouble concentrating 0 - -  Moving slowly or fidgety/restless 0 - -  Suicidal thoughts 0 - -  PHQ-9 Score 0 - -  Difficult doing work/chores Not difficult at all - -     Objective:    Physical Exam Vitals and nursing note reviewed.  Constitutional:      General: He is not in acute distress.    Appearance: Normal appearance. He is normal weight. He is not ill-appearing, toxic-appearing or diaphoretic.  HENT:     Head: Normocephalic and atraumatic.     Right Ear: Tympanic membrane, ear canal and external ear normal.     Left Ear: Tympanic membrane, ear canal and external ear normal.     Mouth/Throat:     Mouth: Mucous membranes are moist.     Pharynx: Oropharynx is clear. No oropharyngeal exudate or posterior oropharyngeal erythema.  Eyes:     General: No scleral icterus.       Right eye: No discharge.        Left eye: No discharge.     Extraocular Movements: Extraocular movements  intact.     Conjunctiva/sclera: Conjunctivae normal.     Pupils: Pupils are equal, round, and reactive to light.  Neck:     Vascular: No carotid bruit.  Cardiovascular:     Rate and Rhythm: Normal rate and regular rhythm.  Pulmonary:     Effort: Pulmonary effort is normal.     Breath sounds: Normal breath sounds.  Abdominal:     General: Abdomen is flat. Bowel sounds  are normal. There is no distension.     Palpations: Abdomen is soft. There is no mass.     Tenderness: There is no abdominal tenderness. There is no right CVA tenderness, left CVA tenderness, guarding or rebound.     Hernia: No hernia is present.  Genitourinary:    Penis: Normal.      Prostate: Enlarged. Not tender and no nodules present.     Rectum: Guaiac result negative. No mass, tenderness, anal fissure, external hemorrhoid or internal hemorrhoid. Normal anal tone.  Musculoskeletal:     Cervical back: No rigidity or tenderness.  Lymphadenopathy:     Cervical: No cervical adenopathy.  Skin:    General: Skin is warm and dry.  Neurological:     Mental Status: He is alert and oriented to person, place, and time.  Psychiatric:        Mood and Affect: Mood normal.        Behavior: Behavior normal.    BP 110/70 (BP Location: Right Arm, Patient Position: Sitting, Cuff Size: Normal)   Pulse 60   Temp 97.7 F (36.5 C) (Temporal)   Ht 5\' 6"  (1.676 m)   Wt 171 lb (77.6 kg)   SpO2 97%   BMI 27.60 kg/m  Wt Readings from Last 3 Encounters:  07/22/21 171 lb (77.6 kg)  05/08/21 171 lb (77.6 kg)  02/19/21 171 lb (77.6 kg)     Health Maintenance Due  Topic Date Due   Zoster Vaccines- Shingrix (1 of 2) Never done   COVID-19 Vaccine (2 - Pfizer series) 06/21/2020   INFLUENZA VACCINE  03/31/2021    There are no preventive care reminders to display for this patient.  Lab Results  Component Value Date   TSH 1.450 02/23/2018   Lab Results  Component Value Date   WBC 3.5 (L) 07/22/2021   HGB 13.3 07/22/2021    HCT 42.0 07/22/2021   MCV 86.8 07/22/2021   PLT 278.0 07/22/2021   Lab Results  Component Value Date   NA 141 07/22/2021   K 4.1 07/22/2021   CO2 31 07/22/2021   GLUCOSE 86 07/22/2021   BUN 11 07/22/2021   CREATININE 0.99 07/22/2021   BILITOT 0.8 07/22/2021   ALKPHOS 64 07/22/2021   AST 12 07/22/2021   ALT 10 07/22/2021   PROT 6.8 07/22/2021   ALBUMIN 4.1 07/22/2021   CALCIUM 9.0 07/22/2021   ANIONGAP 10 04/21/2020   GFR 87.22 07/22/2021   Lab Results  Component Value Date   CHOL 241 (H) 07/22/2021   Lab Results  Component Value Date   HDL 55.20 07/22/2021   Lab Results  Component Value Date   LDLCALC 175 (H) 07/22/2021   Lab Results  Component Value Date   TRIG 55.0 07/22/2021   Lab Results  Component Value Date   CHOLHDL 4 07/22/2021   No results found for: HGBA1C    Assessment & Plan:   Problem List Items Addressed This Visit       Other   Elevated LDL cholesterol level   Relevant Orders   Comprehensive metabolic panel (Completed)   LDL cholesterol, direct (Completed)   Lipid panel (Completed)   Left flank pain - Primary   Relevant Orders   CBC (Completed)   Urinalysis, Routine w reflex microscopic (Completed)   DG Chest 2 View (Completed)   US Abdomen Complete (Completed)   Other Visit Diagnoses     Healthcare maintenance       Relevant Orders   PSA (  Completed)   Urinalysis, Routine w reflex microscopic (Completed)   Pulmonary nodule       Relevant Orders   CT CHEST NODULE FOLLOW UP LOW DOSE W/O       No orders of the defined types were placed in this encounter.   Follow-up: Return in about 6 months (around 01/19/2022).  Patient was given information on health maintenance and disease prevention.  Congratulated him on quitting alcohol.  He was also given information on high cholesterol and preventing high cholesterol.  May need to consider a statin pending today's blood work results.  [name], MD

## 2021-07-23 NOTE — Addendum Note (Signed)
Addended by: Jon Billings on: 07/23/2021 04:51 PM   Modules accepted: Orders

## 2021-07-25 ENCOUNTER — Other Ambulatory Visit: Payer: Self-pay | Admitting: Orthopaedic Surgery

## 2021-07-26 ENCOUNTER — Other Ambulatory Visit: Payer: Self-pay | Admitting: Orthopaedic Surgery

## 2021-07-28 ENCOUNTER — Other Ambulatory Visit: Payer: Self-pay | Admitting: Physician Assistant

## 2021-07-28 MED ORDER — ACETAMINOPHEN-CODEINE #3 300-30 MG PO TABS: 1.0000 | ORAL_TABLET | Freq: Two times a day (BID) | ORAL | 0 refills | Status: DC | PRN

## 2021-08-01 ENCOUNTER — Other Ambulatory Visit: Payer: Self-pay

## 2021-08-01 ENCOUNTER — Ambulatory Visit (HOSPITAL_BASED_OUTPATIENT_CLINIC_OR_DEPARTMENT_OTHER)
Admission: RE | Admit: 2021-08-01 | Discharge: 2021-08-01 | Disposition: A | Discharge status: Home / Self Care | Payer: 59 | Source: Ambulatory Visit | Attending: Family Medicine | Admitting: Family Medicine

## 2021-08-01 DIAGNOSIS — K8020 Calculus of gallbladder without cholecystitis without obstruction: Secondary | ICD-10-CM | POA: Insufficient documentation

## 2021-08-01 DIAGNOSIS — R109 Unspecified abdominal pain: Secondary | ICD-10-CM | POA: Diagnosis not present

## 2021-08-04 ENCOUNTER — Telehealth: Payer: Self-pay | Admitting: Family Medicine

## 2021-08-04 NOTE — Addendum Note (Signed)
Addended by: Jon Billings on: 08/04/2021 03:37 PM   Modules accepted: Orders

## 2021-08-04 NOTE — Telephone Encounter (Signed)
Pt has order in for CT CHEST LUNG CANCER SCREENING LOW DOSE WO CONTRAST CPT 71271. This is not covered by insurance due to pt having a nodule. Recommendation is to order CT Chest w/o contrast (CPT 71250).

## 2021-08-05 DIAGNOSIS — K8020 Calculus of gallbladder without cholecystitis without obstruction: Secondary | ICD-10-CM | POA: Insufficient documentation

## 2021-08-15 ENCOUNTER — Ambulatory Visit (HOSPITAL_BASED_OUTPATIENT_CLINIC_OR_DEPARTMENT_OTHER)
Admission: RE | Admit: 2021-08-15 | Discharge: 2021-08-15 | Disposition: A | Discharge status: Home / Self Care | Payer: 59 | Source: Ambulatory Visit | Attending: Family Medicine | Admitting: Family Medicine

## 2021-08-15 ENCOUNTER — Other Ambulatory Visit: Payer: Self-pay

## 2021-08-15 DIAGNOSIS — R911 Solitary pulmonary nodule: Secondary | ICD-10-CM | POA: Diagnosis not present

## 2021-08-15 IMAGING — CT CT CHEST W/O CM
2 of 3 series · 15 of 36 positions shown, 18 images · non-contrast
Comparison: Chest x-ray [DATE].

CLINICAL DATA: Lung nodule.

EXAM:
CT CHEST WITHOUT CONTRAST
TECHNIQUE: Multidetector CT imaging of the chest was performed following the
standard protocol without IV contrast.

[Series 2: thorax · axial · 0.73mm/px · z∈[-178,+76]mm · 12 of 151 slices shown, 15 images]
[im 12/151  mediastinal]
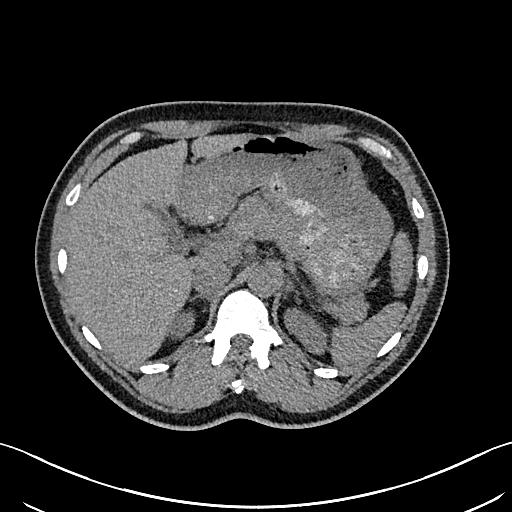
[im 12/151  lung]
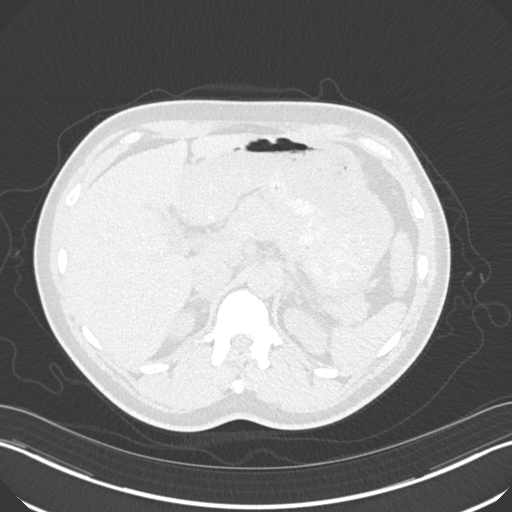
[im 23/151  lung]
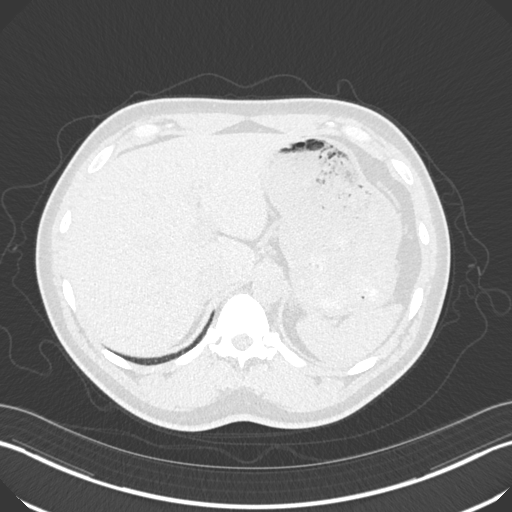
[im 34/151  lung]
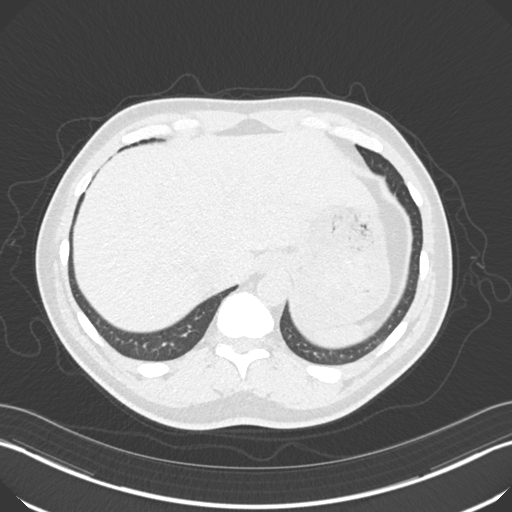
[im 45/151  lung]
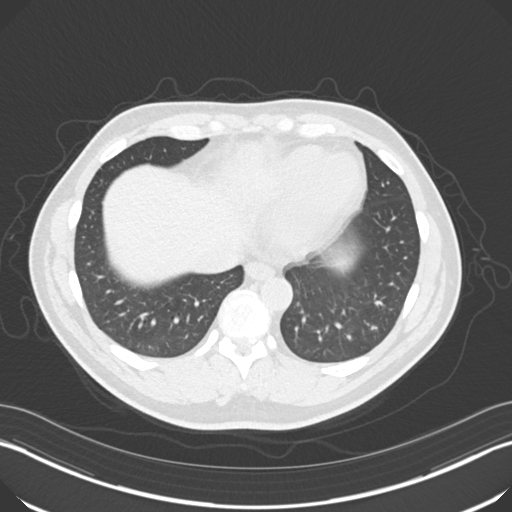
[im 56/151  mediastinal]
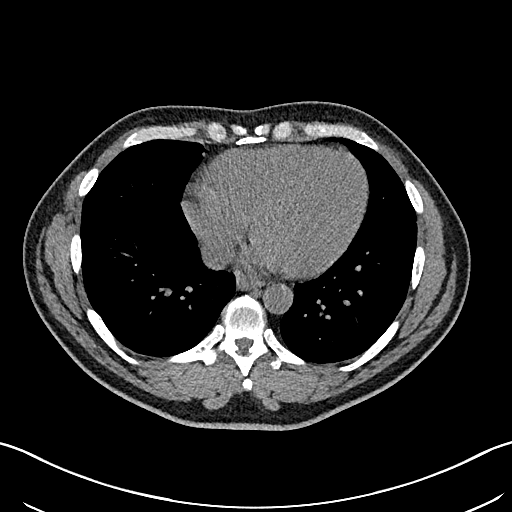
[im 56/151  lung]
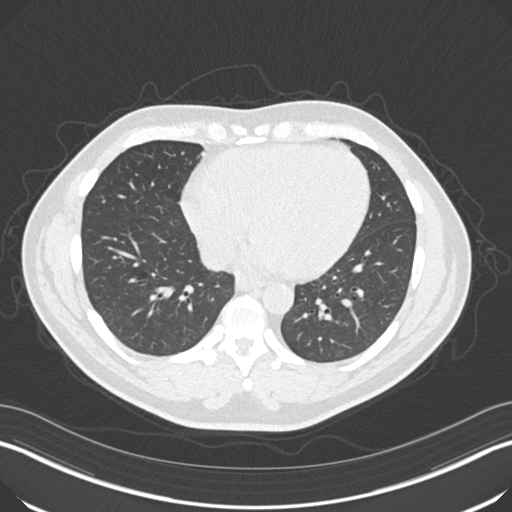
[im 67/151  lung]
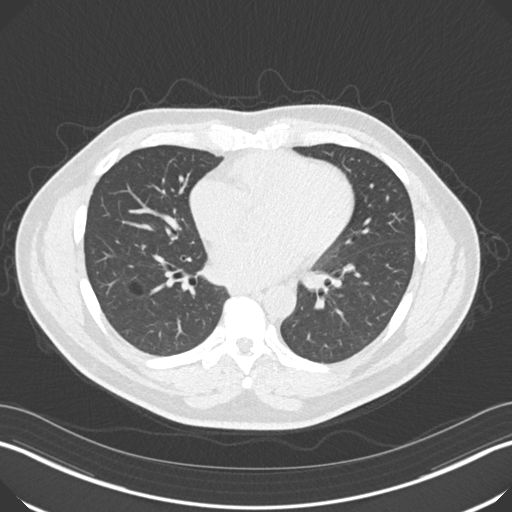
[im 84/151  lung]
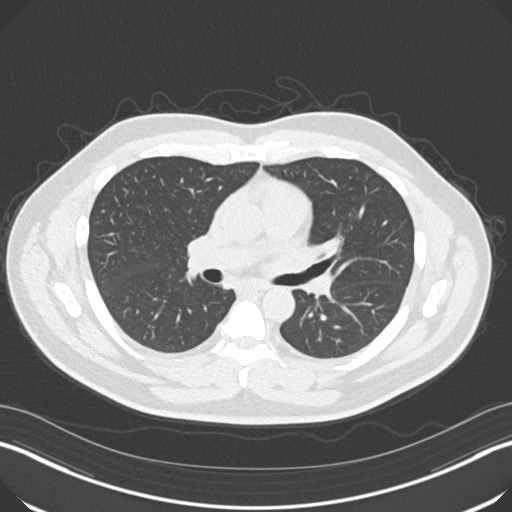
[im 95/151  lung]
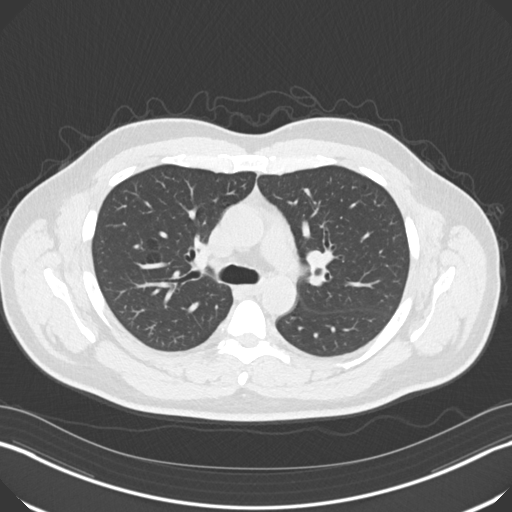
[im 106/151  mediastinal]
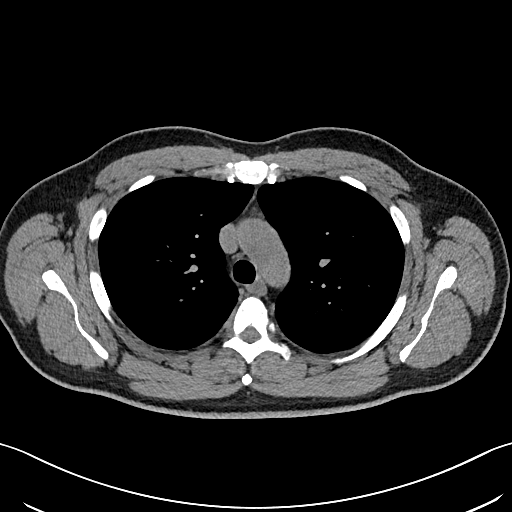
[im 106/151  lung]
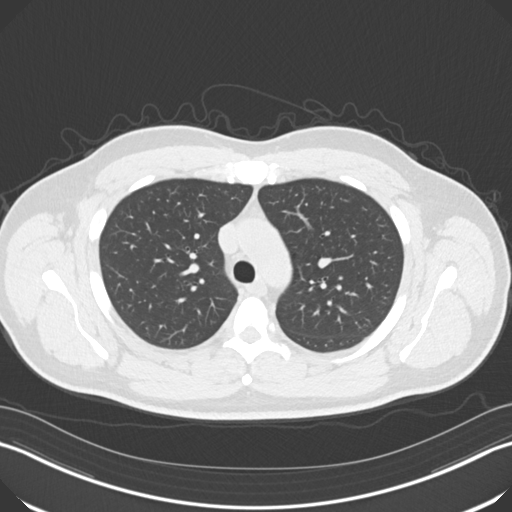
[im 117/151  lung]
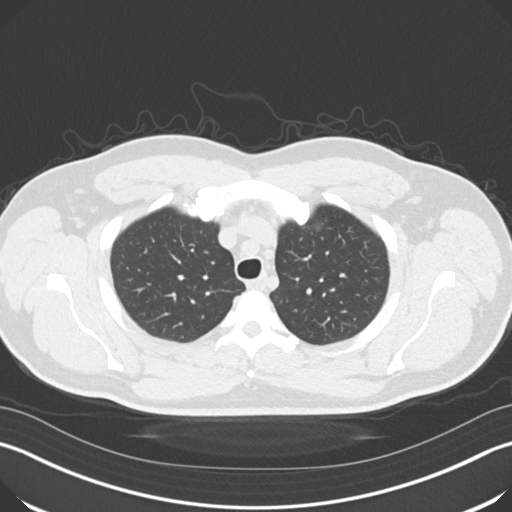
[im 128/151  lung]
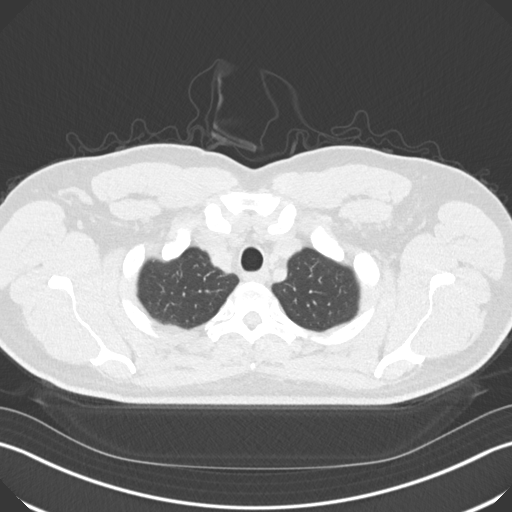
[im 139/151  lung]
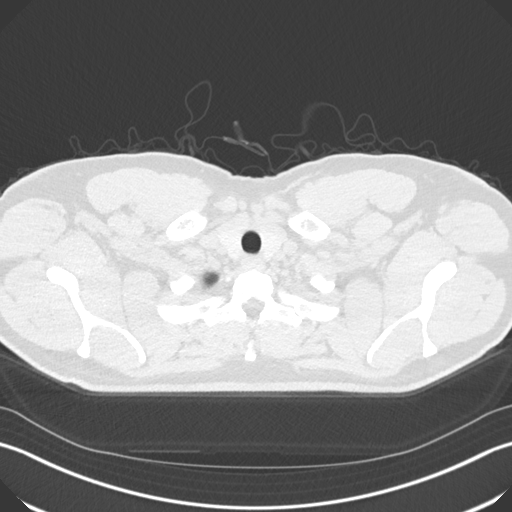

[Series 5: coronal · coronal · 0.60mm/px · 3 of 147 slices shown]
[im 30/147  lung]
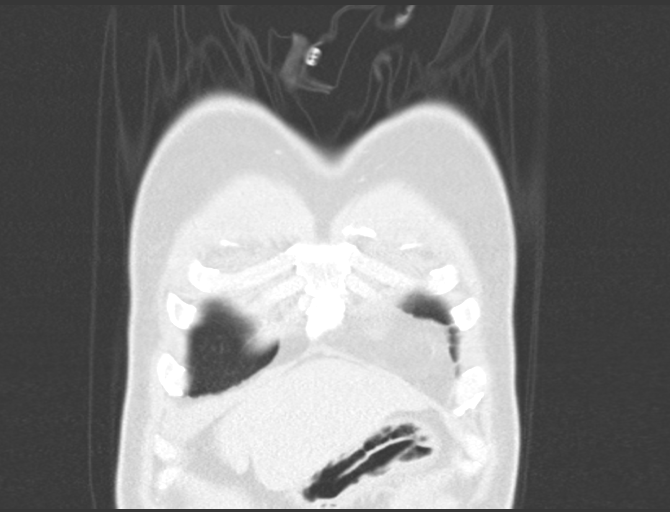
[im 59/147  lung]
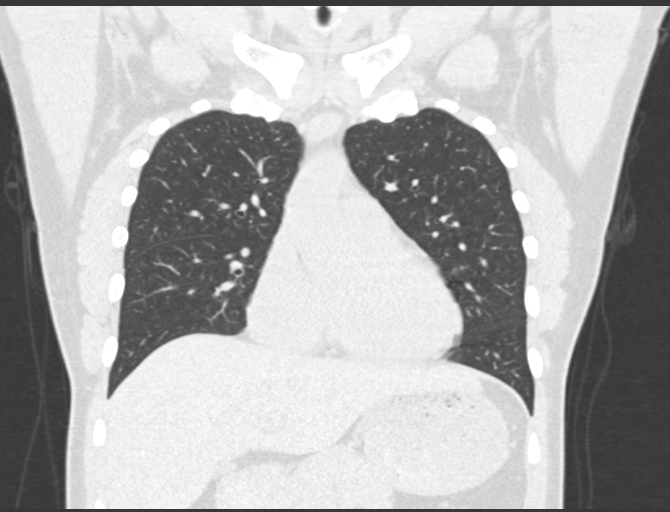
[im 88/147  lung]
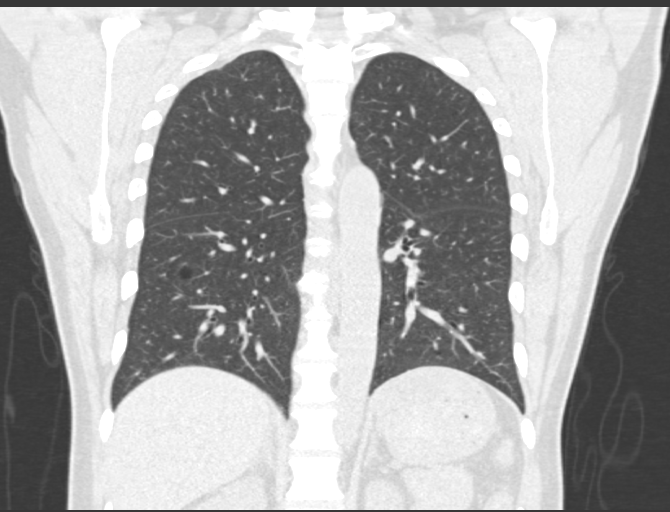

[15 of 36 positions shown; findings below may reference images not displayed]

FINDINGS: Cardiovascular: No significant vascular findings. Normal heart size.
No pericardial effusion.

Mediastinum/Nodes: No enlarged mediastinal or axillary lymph nodes.
Thyroid gland, trachea, and esophagus demonstrate no significant
findings.

Lungs/Pleura: Minimal small cystic areas in the lungs with
imperceptible walls, likely emphysema. No pulmonary nodule. No focal
lung infiltrate. Trachea and central airways are patent. No pleural
effusion or pneumothorax.

Upper Abdomen: No acute abnormality.

Musculoskeletal: No chest wall mass or suspicious bone lesions
identified.
IMPRESSION: 1. No acute cardiopulmonary process.
2. No pulmonary nodule.
3. Minimal cystic change favored as mild emphysema.

## 2021-09-09 ENCOUNTER — Ambulatory Visit (INDEPENDENT_AMBULATORY_CARE_PROVIDER_SITE_OTHER): Payer: 59 | Admitting: Orthopaedic Surgery

## 2021-09-09 ENCOUNTER — Encounter: Payer: Self-pay | Admitting: Orthopaedic Surgery

## 2021-09-09 ENCOUNTER — Other Ambulatory Visit: Payer: Self-pay

## 2021-09-09 DIAGNOSIS — M545 Low back pain, unspecified: Secondary | ICD-10-CM | POA: Diagnosis not present

## 2021-09-09 NOTE — Progress Notes (Signed)
Office Visit Note   Patient: Jared Park.           Date of Birth: [birth year]           MRN: [id number] Visit Date: 09/09/2021              Requested by: [name], MD [address] [place],  [place] [address] PCP: [name], MD   Assessment & Plan: Visit Diagnoses:  1. Low back pain, unspecified back pain laterality, unspecified chronicity, unspecified whether sciatica present     Plan: Impression is chronic left-sided mid to low back pain.  At this point, the patient has not had good relief following the epidural steroid injection and he feels as though he is in too much pain for physical therapy.  I would like to have him follow-up with Dr. Louanne Skye or Lorin Mercy for further evaluation and treatment recommendation.  Follow-Up Instructions: Return if symptoms worsen or fail to improve.   Orders:  No orders of the defined types were placed in this encounter.  No orders of the defined types were placed in this encounter.     Procedures: No procedures performed   Clinical Data: No additional findings.   Subjective: Chief Complaint  Patient presents with   Lower Back - Pain    HPI patient is a pleasant 56 year old gentleman who comes in today with continued left mid to lower back pain.  He has been dealing with this for several months following lifting injury.  He does note that his pain has somewhat eased off but continues to be quite bothersome.  He underwent thoracic and lumbar spine MRIs which showed a left-sided paracentral disc extrusion T10-11.  He was referred to Dr. Ernestina Patches for epidural steroid injection at T11-12.  He denies any relief following the injection.  No new symptoms.  Review of Systems as detailed in HPI.  All others reviewed and are negative.   Objective: Vital Signs: There were no vitals taken for this visit.  Physical Exam well-developed and well-nourished gentleman in no acute distress.  Alert and oriented  x3.  Ortho Exam unchanged spine exam  Specialty Comments:  No specialty comments available.  Imaging: No new imaging   PMFS History: Patient Active Problem List   Diagnosis Date Noted   Cholelithiasis 08/05/2021   Left flank pain 07/22/2021   Acute medial meniscus tear, right, initial encounter 02/19/2021   Synovitis of knee 02/19/2021   Erectile dysfunction 10/25/2020   Elevated LFTs 06/11/2020   DOE (dyspnea on exertion) 06/11/2020   Elevated LDL cholesterol level 03/12/2020   Cervical strain 03/12/2020   COMMON MIGRAINE 05/17/2007   Past Medical History:  Diagnosis Date   COVID 03/2020   MMT (medial meniscus tear)    right    Family History  Problem Relation Age of Onset   Diabetes Mother    Hyperlipidemia Mother    Hypertension Mother    Stroke Father    Healthy Sister    Healthy Brother    Colon polyps Neg Hx    Colon cancer Neg Hx    Esophageal cancer Neg Hx    Rectal cancer Neg Hx    Stomach cancer Neg Hx     Past Surgical History:  Procedure Laterality Date   KNEE ARTHROSCOPY WITH MEDIAL MENISECTOMY Right 02/19/2021   Procedure: RIGHT KNEE ARTHROSCOPY WITH PARTIAL MEDIAL MENISECTOMY;  Surgeon: Leandrew Koyanagi, MD;  Location: [place];  Service: Orthopedics;  Laterality: Right;  WISDOM TOOTH EXTRACTION     age 76   Social History   Occupational History   Not on file  Tobacco Use   Smoking status: Never   Smokeless tobacco: Never  Vaping Use   Vaping Use: Never used  Substance and Sexual Activity   Alcohol use: Not Currently    Comment: none since 03-2020   Drug use: No   Sexual activity: Yes

## 2021-09-21 ENCOUNTER — Encounter: Payer: Self-pay | Admitting: Orthopaedic Surgery

## 2021-09-22 ENCOUNTER — Other Ambulatory Visit: Payer: Self-pay | Admitting: Physician Assistant

## 2021-09-22 MED ORDER — ACETAMINOPHEN-CODEINE #3 300-30 MG PO TABS: 1.0000 | ORAL_TABLET | Freq: Two times a day (BID) | ORAL | 0 refills | Status: DC | PRN

## 2021-09-22 NOTE — Telephone Encounter (Signed)
Sent in

## 2021-10-01 ENCOUNTER — Encounter: Payer: Self-pay | Admitting: Orthopaedic Surgery

## 2021-10-01 ENCOUNTER — Other Ambulatory Visit: Payer: Self-pay

## 2021-10-01 ENCOUNTER — Ambulatory Visit (INDEPENDENT_AMBULATORY_CARE_PROVIDER_SITE_OTHER): Payer: 59 | Admitting: Orthopaedic Surgery

## 2021-10-01 DIAGNOSIS — M5124 Other intervertebral disc displacement, thoracic region: Secondary | ICD-10-CM | POA: Insufficient documentation

## 2021-10-01 MED ORDER — GABAPENTIN 100 MG PO CAPS: ORAL_CAPSULE | ORAL | 2 refills | Status: DC

## 2021-10-01 NOTE — Progress Notes (Signed)
Office Visit Note   Patient: Jared Park.           Date of Birth: [birth year]           MRN: [id number] Visit Date: 10/01/2021              Requested by: [name], MD [address] [place],  [place] [address] PCP: [name], MD   Assessment & Plan: Visit Diagnoses:  1. Thoracic disc herniation     Plan: Left T 10-11 thoracic disc extrusion with radicular symptoms no cauda equina syndrome.  We will place him on some Neurontin recheck him in 2 months.  Start with 100 mg gradually increase to 300 mg daily.  Follow-Up Instructions: Return in about 2 months (around 11/29/2021).   Orders:  No orders of the defined types were placed in this encounter.  Meds ordered this encounter  Medications   gabapentin (NEURONTIN) 100 MG capsule    Sig: Take one daily for one week then 2 daily for 1 wk then one po TID.    Dispense:  90 capsule    Refill:  2      Procedures: No procedures performed   Clinical Data: No additional findings.   Subjective: Chief Complaint  Patient presents with   Lower Back - Pain    HPI 56 year old male referred by Dr.Xu for left paracentral T10-11 disc protrusion extrusion.  Patient has pain lower thoracic region left side it radiates just to the posterior axillary line stops does not rotate around anteriorly.  No weakness.  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  Denies bowel bladder symptoms.  MRI scan was done 06/10/2021.  Epidural 07/03/2021 did not give him relief.  He has used Tylenol 3 occasionally.  Increased pain with bending or turning over.  Sometimes bothers him at night.  Conus extends to L1-2 level.  Review of Systems All other systems noncontributory to HPI.  Objective: Vital Signs: BP 125/81    Pulse 70    Ht 5\' 6"  (1.676 m)    Wt 171 lb (77.6 kg)    BMI 27.60 kg/m   Physical Exam Constitutional:      Appearance: He is well-developed.  HENT:     Head: Normocephalic and  atraumatic.     Right Ear: External ear normal.     Left Ear: External ear normal.  Eyes:     Pupils: Pupils are equal, round, and reactive to light.  Neck:     Thyroid: No thyromegaly.     Trachea: No tracheal deviation.  Cardiovascular:     Rate and Rhythm: Normal rate.  Pulmonary:     Effort: Pulmonary effort is normal.     Breath sounds: No wheezing.  Abdominal:     General: Bowel sounds are normal.     Palpations: Abdomen is soft.  Musculoskeletal:     Cervical back: Neck supple.  Skin:    General: Skin is warm and dry.     Capillary Refill: Capillary refill takes less than 2 seconds.  Neurological:     Mental Status: He is alert and oriented to person, place, and time.  Psychiatric:        Behavior: Behavior normal.        Thought Content: Thought content normal.        Judgment: Judgment normal.    Ortho Exam normal heel toe gait no lower extremity clonus no lower extremity  weakness.  Symmetrical reflexes.  Specialty Comments:  No specialty comments available.  Imaging: FINDINGS: MRI THORACIC SPINE FINDINGS   Alignment:  Physiologic.   Vertebrae: No fracture, evidence of discitis, or bone lesion.   Cord:  Normal signal and morphology.   Paraspinal and other soft tissues: No perispinal mass or inflammation   Disc levels:   Generally preserved disc height and hydration and lack of facet spurring.   Notable left paracentral extrusion at T10-11 contacting the left cord and presumably impinging on the intradural left T10 nerve root. Small right paracentral to foraminal protrusion at T5-6.   MRI LUMBAR SPINE FINDINGS   Segmentation: Transitional S1 vertebra when numbered from above, see thoracic spine counting series.   Alignment:  Unremarkable   Vertebrae:  No fracture, evidence of discitis, or bone lesion.   Conus medullaris and cauda equina: Conus extends to the L1-2 level. Conus and cauda equina appear normal.   Paraspinal and other soft  tissues: Negative for perispinal mass or inflammation   Disc levels:   Mild annulus bulging at L3-4 and L4-5 where disc height and hydration is well preserved.   Mild disc desiccation and narrowing at L5-S1 and S1-2 with annular fissure and protrusion seen centrally at both levels. Disc protrusions contact descending S1 and S2 nerve roots without neural compression. Negative facets. Patent foramina.   IMPRESSION: MR THORACIC SPINE IMPRESSION   Focal notable left paracentral extrusion at T10-11 with presumed intradural nerve root compression.   MR LUMBAR SPINE IMPRESSION   1. 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 2. L5-S1 and S1-2 disc degeneration with protrusions contacting but not compressing the descending nerve roots at the subarticular recesses.     Electronically Signed   By: Jorje Guild M.D.   On: 06/12/2021 09:45   PMFS History: Patient Active Problem List   Diagnosis Date Noted   Thoracic disc herniation 10/01/2021   Cholelithiasis 08/05/2021   Left flank pain 07/22/2021   Acute medial meniscus tear, right, initial encounter 02/19/2021   Synovitis of knee 02/19/2021   Erectile dysfunction 10/25/2020   Elevated LFTs 06/11/2020   DOE (dyspnea on exertion) 06/11/2020   Elevated LDL cholesterol level 03/12/2020   Cervical strain 03/12/2020   COMMON MIGRAINE 05/17/2007   Past Medical History:  Diagnosis Date   COVID 03/2020   MMT (medial meniscus tear)    right    Family History  Problem Relation Age of Onset   Diabetes Mother    Hyperlipidemia Mother    Hypertension Mother    Stroke Father    Healthy Sister    Healthy Brother    Colon polyps Neg Hx    Colon cancer Neg Hx    Esophageal cancer Neg Hx    Rectal cancer Neg Hx    Stomach cancer Neg Hx     Past Surgical History:  Procedure Laterality Date   KNEE ARTHROSCOPY WITH MEDIAL MENISECTOMY Right 02/19/2021   Procedure: RIGHT KNEE ARTHROSCOPY WITH PARTIAL MEDIAL MENISECTOMY;   Surgeon: Leandrew Koyanagi, MD;  Location: [place];  Service: Orthopedics;  Laterality: Right;   WISDOM TOOTH EXTRACTION     age 36   Social History   Occupational History   Not on file  Tobacco Use   Smoking status: Never   Smokeless tobacco: Never  Vaping Use   Vaping Use: Never used  Substance and Sexual Activity   Alcohol use: Not Currently    Comment: none since 03-2020   Drug use: No  Sexual activity: Yes

## 2021-11-08 ENCOUNTER — Encounter: Payer: Self-pay | Admitting: Orthopaedic Surgery

## 2021-11-10 ENCOUNTER — Other Ambulatory Visit: Payer: Self-pay | Admitting: Physician Assistant

## 2021-11-10 MED ORDER — ACETAMINOPHEN-CODEINE #3 300-30 MG PO TABS: 1.0000 | ORAL_TABLET | Freq: Two times a day (BID) | ORAL | 0 refills | Status: DC | PRN

## 2021-11-10 NOTE — Telephone Encounter (Signed)
sent 

## 2022-02-18 ENCOUNTER — Other Ambulatory Visit: Payer: Self-pay

## 2022-02-18 ENCOUNTER — Emergency Department (HOSPITAL_COMMUNITY)
Admission: EM | Admit: 2022-02-18 | Discharge: 2022-02-18 | Disposition: A | Discharge status: Home / Self Care | Payer: 59 | Attending: Emergency Medicine | Admitting: Emergency Medicine

## 2022-02-18 ENCOUNTER — Encounter (HOSPITAL_COMMUNITY): Payer: Self-pay

## 2022-02-18 ENCOUNTER — Emergency Department (HOSPITAL_COMMUNITY): Payer: 59

## 2022-02-18 DIAGNOSIS — R001 Bradycardia, unspecified: Secondary | ICD-10-CM | POA: Diagnosis not present

## 2022-02-18 DIAGNOSIS — R944 Abnormal results of kidney function studies: Secondary | ICD-10-CM | POA: Insufficient documentation

## 2022-02-18 DIAGNOSIS — R42 Dizziness and giddiness: Secondary | ICD-10-CM | POA: Diagnosis not present

## 2022-02-18 DIAGNOSIS — R112 Nausea with vomiting, unspecified: Secondary | ICD-10-CM | POA: Insufficient documentation

## 2022-02-18 DIAGNOSIS — R12 Heartburn: Secondary | ICD-10-CM | POA: Diagnosis not present

## 2022-02-18 DIAGNOSIS — R072 Precordial pain: Secondary | ICD-10-CM | POA: Diagnosis present

## 2022-02-18 DIAGNOSIS — R079 Chest pain, unspecified: Secondary | ICD-10-CM

## 2022-02-18 LAB — CBC
HCT: 44 % (ref 39.0–52.0)
Hemoglobin: 14.1 g/dL (ref 13.0–17.0)
MCH: 27.9 pg (ref 26.0–34.0)
MCHC: 32 g/dL (ref 30.0–36.0)
MCV: 87 fL (ref 80.0–100.0)
Platelets: 314 10*3/uL (ref 150–400)
RBC: 5.06 MIL/uL (ref 4.22–5.81)
RDW: 13.3 % (ref 11.5–15.5)
WBC: 4.7 10*3/uL (ref 4.0–10.5)
nRBC: 0 % (ref 0.0–0.2)

## 2022-02-18 LAB — BASIC METABOLIC PANEL
Anion gap: 11 (ref 5–15)
BUN: 13 mg/dL (ref 6–20)
CO2: 28 mmol/L (ref 22–32)
Calcium: 9 mg/dL (ref 8.9–10.3)
Chloride: 102 mmol/L (ref 98–111)
Creatinine, Ser: 1.27 mg/dL — ABNORMAL HIGH (ref 0.61–1.24)
GFR, Estimated: >60 mL/min (ref >60)
Glucose, Bld: 125 mg/dL — ABNORMAL HIGH (ref 70–99)
Potassium: 3.7 mmol/L (ref 3.5–5.1)
Sodium: 141 mmol/L (ref 135–145)

## 2022-02-18 LAB — TROPONIN I (HIGH SENSITIVITY)
Troponin I (High Sensitivity): 3 ng/L (ref <18)
Troponin I (High Sensitivity): 3 ng/L (ref <18)

## 2022-02-18 IMAGING — CR DG CHEST 2V
2 series · 2 of 2 positions shown · non-contrast
Comparison: Chest CT [DATE]. Prior chest radiographs
[DATE].

CLINICAL DATA: Provided history: Chest pain. Additional history
provided: Nausea, vomiting.

EXAM:
CHEST - 2 VIEW

[chest pa]
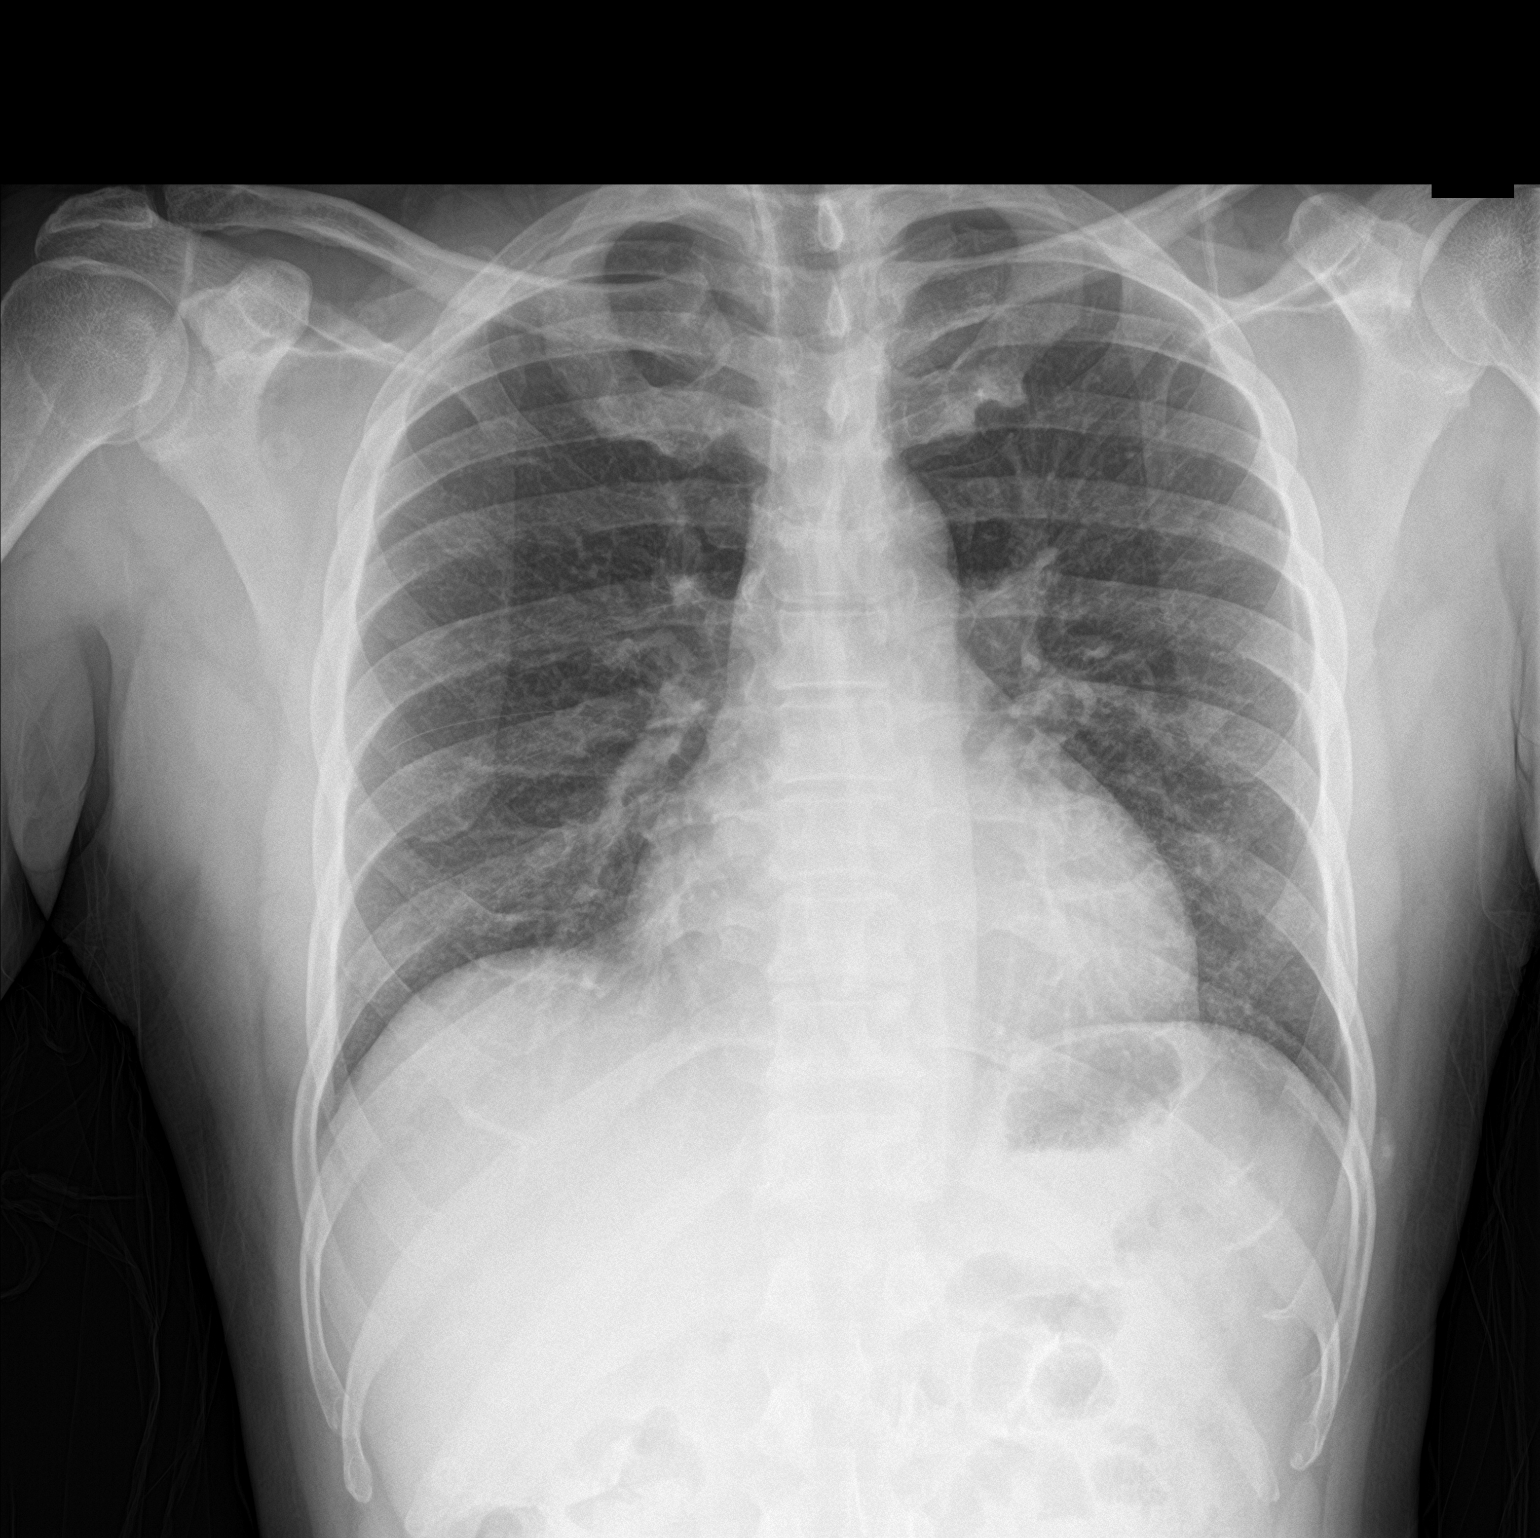

[chest lat]
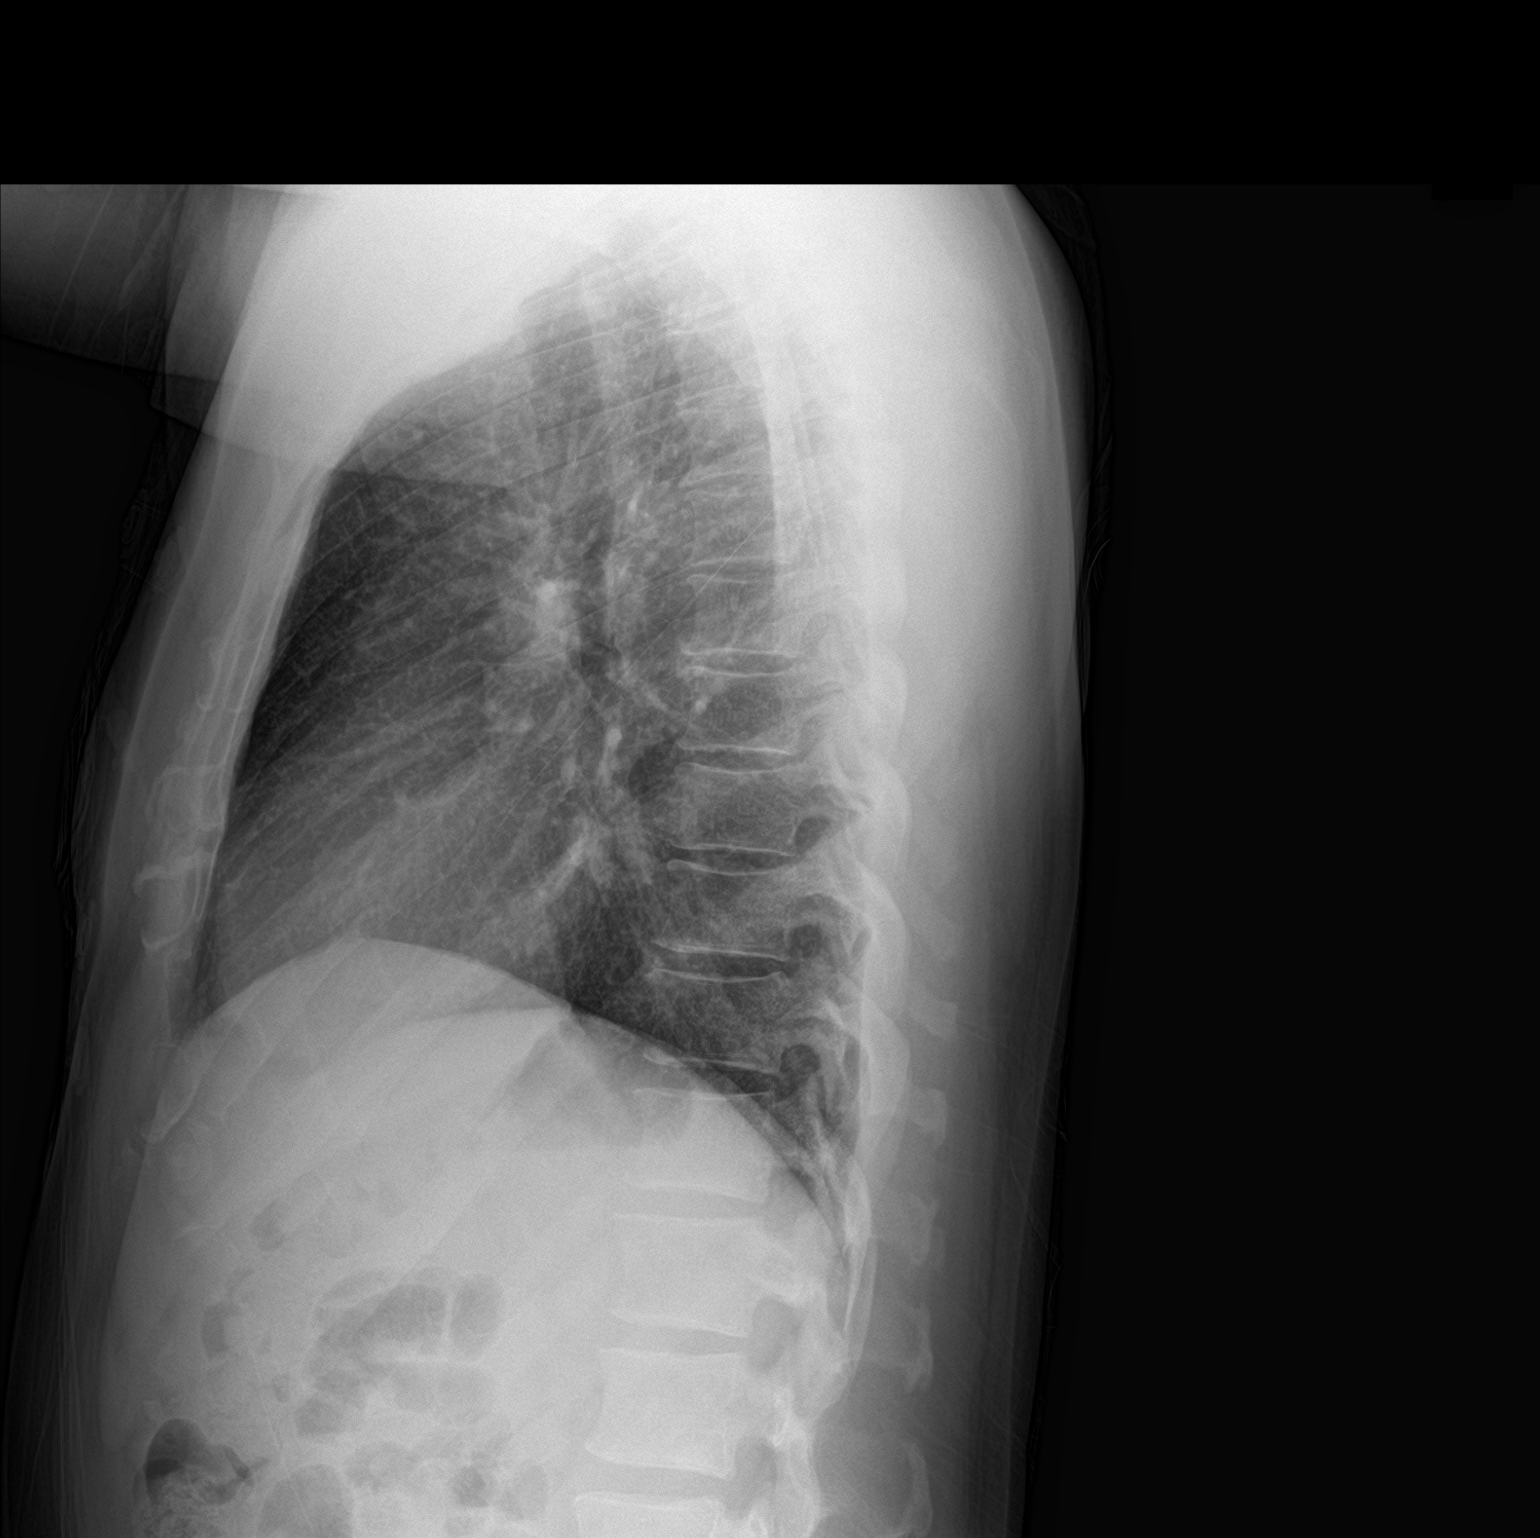

[2 of 2 positions shown; findings below may reference images not displayed]

FINDINGS: Heart size within normal limits. No appreciable airspace
consolidation. No evidence of pleural effusion or pneumothorax. No
acute bony abnormality identified.
IMPRESSION: No evidence of acute cardiopulmonary abnormality.

## 2022-02-18 MED ORDER — PANTOPRAZOLE SODIUM 40 MG PO TBEC: 40.0000 mg | DELAYED_RELEASE_TABLET | Freq: Every day | ORAL | 0 refills | Status: DC

## 2022-02-18 NOTE — Discharge Instructions (Signed)
You were seen in the emergency department for evaluation of chest pain.  You had blood work EKG and a chest x-ray that did not show any evidence of heart injury.  We are prescribing some acid medication.  Please follow-up with your primary care doctor.  Return to the emergency department if any worsening or concerning symptoms.

## 2022-02-18 NOTE — ED Triage Notes (Signed)
Pt arrived POV from home c/o centralized CP that started at 2am. Pt denies any pain radiation but endorses N/V.

## 2022-02-18 NOTE — ED Provider Notes (Signed)
[facility] EMERGENCY DEPARTMENT Provider Note   CSN: [id number] Arrival date & time: 02/18/22  0630     History  Chief Complaint  Patient presents with   Chest Pain    Jared Park. is a 56 y.o. male.  He said he started experiencing some burning chest pain that started around 1 AM this morning.  Associated with dizziness.  Tried some antacids without improvement.  Ultimately around 6 AM decided to come to the emergency department for evaluation.  Denies prior history of cardiac disease.  Non-smoker no alcohol.  Initial pain was 8 out of 10 now rates it a 0 out of 10.  The history is provided by the patient.  Chest Pain Pain location:  Substernal area Pain quality: burning   Pain radiates to:  Does not radiate Pain severity:  Severe Onset quality:  Gradual Duration:  10 hours Timing:  Constant Progression:  Resolved Chronicity:  New Context: at rest   Relieved by:  Nothing Worsened by:  Nothing Ineffective treatments:  Antacids Associated symptoms: heartburn, nausea and vomiting   Associated symptoms: no abdominal pain, no cough, no diaphoresis, no fever and no shortness of breath        Home Medications Prior to Admission medications   Medication Sig Start Date End Date Taking? Authorizing Provider  acetaminophen-codeine (TYLENOL #3) 300-30 MG tablet Take 1 tablet by mouth every 12 (twelve) hours as needed for moderate pain. 11/10/21   Aundra Dubin, PA-C  gabapentin (NEURONTIN) 100 MG capsule Take one daily for one week then 2 daily for 1 wk then one po TID. 10/01/21   Marybelle Killings, MD  tadalafil (CIALIS) 20 MG tablet Take 0.5-1 tablets (10-20 mg total) by mouth every other day as needed for erectile dysfunction. 10/25/20   Haydee Salter, MD      Allergies    Patient has no known allergies.    Review of Systems   Review of Systems  Constitutional:  Negative for diaphoresis and fever.  HENT:  Negative for sore throat.   Eyes:  Negative  for visual disturbance.  Respiratory:  Negative for cough and shortness of breath.   Cardiovascular:  Positive for chest pain.  Gastrointestinal:  Positive for heartburn, nausea and vomiting. Negative for abdominal pain.  Genitourinary:  Negative for dysuria.  Skin:  Negative for rash.    Physical Exam Updated Vital Signs BP 119/77 (BP Location: Left Arm)   Pulse (!) 50   Temp (!) 97 F (36.1 C) (Oral)   Resp 18   Ht '5\' 6"'$  (1.676 m)   Wt 77.1 kg   SpO2 99%   BMI 27.44 kg/m  Physical Exam Vitals and nursing note reviewed.  Constitutional:      General: He is not in acute distress.    Appearance: He is well-developed.  HENT:     Head: Normocephalic and atraumatic.  Eyes:     Conjunctiva/sclera: Conjunctivae normal.  Cardiovascular:     Rate and Rhythm: Normal rate and regular rhythm.     Heart sounds: No murmur heard. Pulmonary:     Effort: Pulmonary effort is normal. No respiratory distress.     Breath sounds: Normal breath sounds.  Abdominal:     Palpations: Abdomen is soft.     Tenderness: There is no abdominal tenderness.  Musculoskeletal:        General: No swelling. Normal range of motion.     Cervical back: Neck supple.  Right lower leg: No tenderness.     Left lower leg: No tenderness.  Skin:    General: Skin is warm and dry.     Capillary Refill: Capillary refill takes less than 2 seconds.  Neurological:     General: No focal deficit present.     Mental Status: He is alert.     ED Results / Procedures / Treatments   Labs (all labs ordered are listed, but only abnormal results are displayed) Labs Reviewed  BASIC METABOLIC PANEL - Abnormal; Notable for the following components:      Result Value   Glucose, Bld 125 (*)    Creatinine, Ser 1.27 (*)    All other components within normal limits  CBC  TROPONIN I (HIGH SENSITIVITY)  TROPONIN I (HIGH SENSITIVITY)    EKG None EKG not crossing into epic.  Sinus bradycardia rate of 51 no acute ST-T  changes. Radiology DG Chest 2 View  Result Date: 02/18/2022 CLINICAL DATA:  Provided history: Chest pain. Additional history provided: Nausea, vomiting. EXAM: CHEST - 2 VIEW COMPARISON:  Chest CT 08/15/2021. Prior chest radiographs 07/22/2021. FINDINGS: Heart size within normal limits. No appreciable airspace consolidation. No evidence of pleural effusion or pneumothorax. No acute bony abnormality identified. IMPRESSION: No evidence of acute cardiopulmonary abnormality. Electronically Signed   By: Kellie Simmering D.O.   On: 02/18/2022 07:14    Procedures Procedures    Medications Ordered in ED Medications - No data to display  ED Course/ Medical Decision Making/ A&P Clinical Course as of 02/18/22 1719  Wed Feb 18, 2022  1329 Troponin does not show any significant changes.  Patient is currently pain-free.  We will put on PPI and recommend follow-up with PCP.  Return instructions discussed [MB]    Clinical Course User Index [MB] Hayden Rasmussen, MD                           Medical Decision Making Amount and/or Complexity of Data Reviewed Labs: ordered. Radiology: ordered.  Risk Prescription drug management.  This patient complains of chest pain; this involves an extensive number of treatment Options and is a complaint that carries with it a high risk of complications and morbidity. The differential includes ACS, pneumonia, pneumothorax, PE, reflux, musculoskeletal, vascular  I ordered, reviewed and interpreted labs, which included CBC with normal white count normal hemoglobin, chemistries normal other than mildly elevated glucose and creatinine  I ordered imaging studies which included chest x-ray and I independently    visualized and interpreted imaging which showed no acute findings Additional history obtained from patient's family member Previous records obtained and reviewed in epic no recent cardiac work-ups  Cardiac monitoring reviewed, normal sinus rhythm Social  determinants considered, no significant barriers Critical Interventions: None  After the interventions stated above, I reevaluated the patient and found patient currently pain-free and asymptomatic Admission and further testing considered, no indications for admission or further work-up at this time.  Will start on PPI and recommended follow-up with PCP.  Return instructions discussed          Final Clinical Impression(s) / ED Diagnoses Final diagnoses:  Nonspecific chest pain    Rx / DC Orders ED Discharge Orders          Ordered    pantoprazole (PROTONIX) 40 MG tablet  Daily        02/18/22 1330  Hayden Rasmussen, MD 02/18/22 [phone]

## 2022-03-27 ENCOUNTER — Encounter: Payer: Self-pay | Admitting: Orthopaedic Surgery

## 2022-03-27 NOTE — Telephone Encounter (Signed)
He is seeing yates for his current problem. Can you send to yates to see if he wants to refill.

## 2022-03-28 ENCOUNTER — Other Ambulatory Visit: Payer: Self-pay | Admitting: Orthopaedic Surgery

## 2022-03-29 NOTE — Telephone Encounter (Signed)
See message from yates.  Can refer to pain mgmt or see if his pcp would like to take over

## 2022-04-05 ENCOUNTER — Encounter: Payer: Self-pay | Admitting: Family Medicine

## 2022-04-06 ENCOUNTER — Other Ambulatory Visit: Payer: Self-pay

## 2022-04-06 DIAGNOSIS — K219 Gastro-esophageal reflux disease without esophagitis: Secondary | ICD-10-CM

## 2022-04-06 MED ORDER — PANTOPRAZOLE SODIUM 40 MG PO TBEC: 40.0000 mg | DELAYED_RELEASE_TABLET | Freq: Every day | ORAL | 0 refills | Status: DC

## 2022-05-14 ENCOUNTER — Ambulatory Visit: Payer: Self-pay

## 2022-05-14 ENCOUNTER — Ambulatory Visit: Payer: 59 | Admitting: Surgery

## 2022-05-14 DIAGNOSIS — M546 Pain in thoracic spine: Secondary | ICD-10-CM | POA: Diagnosis not present

## 2022-05-14 DIAGNOSIS — M545 Low back pain, unspecified: Secondary | ICD-10-CM

## 2022-05-14 DIAGNOSIS — M5124 Other intervertebral disc displacement, thoracic region: Secondary | ICD-10-CM | POA: Diagnosis not present

## 2022-05-14 NOTE — Progress Notes (Signed)
Office Visit Note   Patient: Jared Park.           Date of Birth: [birth year]           MRN: [id number] Visit Date: 05/14/2022              Requested by: [name], MD [address] [place],  [place] [address] PCP: [name], MD   Assessment & Plan: Visit Diagnoses:  1. Low back pain, unspecified back pain laterality, unspecified chronicity, unspecified whether sciatica present   2. Thoracic disc herniation   3. Pain in thoracic spine     Plan: The patient's ongoing thoracic pain I will repeat thoracic MRI and compare to the study that was done last year.  Follow-up with Dr. Lorin Mercy in 3 weeks for recheck to discuss results and further treatment options.  Dr. Lorin Mercy can decide if he needs lumbar MRI repeated.  Follow-Up Instructions: Return in about 3 weeks (around 06/04/2022) for with dr yates to review thoracic mri and discuss treatment options.   Orders:  Orders Placed This Encounter  Procedures   XR Lumbar Spine 2-3 Views   MR Thoracic Spine w/o contrast   No orders of the defined types were placed in this encounter.     Procedures: No procedures performed   Clinical Data: No additional findings.   Subjective: Chief Complaint  Patient presents with   Lower Back - Pain    HPI 56 year old black male returns with complaints of back pain.  Last seen by Dr. Lorin Mercy October 01, 2021.  Known thoracic disc herniation.  Continues have ongoing pain with this.  Denies lower extremity radicular symptoms. Review of Systems No current complaints of cardiopulmonary GI/GU issues  Objective: Vital Signs: There were no vitals taken for this visit.  Physical Exam HENT:     Head: Normocephalic and atraumatic.     Nose: Nose normal.  Pulmonary:     Effort: No respiratory distress.  Musculoskeletal:     Comments: Patient has right thoracic paraspinal tenderness.  Negative log roll bilateral hips.  Negative straight leg raise.  No focal  motor deficits.  Neurological:     Mental Status: He is alert.     Ortho Exam  Specialty Comments:  No specialty comments available.  Imaging: No results found.   PMFS History: Patient Active Problem List   Diagnosis Date Noted   Thoracic disc herniation 10/01/2021   Cholelithiasis 08/05/2021   Left flank pain 07/22/2021   Acute medial meniscus tear, right, initial encounter 02/19/2021   Synovitis of knee 02/19/2021   Erectile dysfunction 10/25/2020   Elevated LFTs 06/11/2020   DOE (dyspnea on exertion) 06/11/2020   Elevated LDL cholesterol level 03/12/2020   Cervical strain 03/12/2020   COMMON MIGRAINE 05/17/2007   Past Medical History:  Diagnosis Date   COVID 03/2020   MMT (medial meniscus tear)    right    Family History  Problem Relation Age of Onset   Diabetes Mother    Hyperlipidemia Mother    Hypertension Mother    Stroke Father    Healthy Sister    Healthy Brother    Colon polyps Neg Hx    Colon cancer Neg Hx    Esophageal cancer Neg Hx    Rectal cancer Neg Hx    Stomach cancer Neg Hx     Past Surgical History:  Procedure Laterality Date   KNEE ARTHROSCOPY WITH MEDIAL MENISECTOMY Right 02/19/2021   Procedure: RIGHT  KNEE ARTHROSCOPY WITH PARTIAL MEDIAL MENISECTOMY;  Surgeon: Leandrew Koyanagi, MD;  Location: [facility];  Service: Orthopedics;  Laterality: Right;   WISDOM TOOTH EXTRACTION     age 38   Social History   Occupational History   Not on file  Tobacco Use   Smoking status: Never   Smokeless tobacco: Never  Vaping Use   Vaping Use: Never used  Substance and Sexual Activity   Alcohol use: Not Currently    Comment: none since 03-2020   Drug use: No   Sexual activity: Yes

## 2022-05-18 ENCOUNTER — Other Ambulatory Visit: Payer: Self-pay | Admitting: Physician Assistant

## 2022-05-22 ENCOUNTER — Encounter: Payer: Self-pay | Admitting: Surgery

## 2022-05-22 NOTE — Progress Notes (Incomplete)
   Office Visit Note   Patient: Avish Torry.           Date of Birth: 1966/03/29           MRN: [id number] Visit Date: 05/14/2022              Requested by: [name], MD [address] [place],  [place] [address] PCP: [name], MD   Assessment & Plan: Visit Diagnoses:  1. Low back pain, unspecified back pain laterality, unspecified chronicity, unspecified whether sciatica present   2. Thoracic disc herniation   3. Pain in thoracic spine     Plan: ***  Follow-Up Instructions: Return in about 3 weeks (around 06/04/2022) for WITH DR YATES TO REVIEW THORACIC AND LUMBAR MRI SCANS.   Orders:  Orders Placed This Encounter  Procedures  . XR Lumbar Spine 2-3 Views  . MR Thoracic Spine w/o contrast   No orders of the defined types were placed in this encounter.     Procedures: No procedures performed   Clinical Data: No additional findings.   Subjective: Chief Complaint  Patient presents with  . Lower Back - Pain    HPI  Review of Systems   Objective: Vital Signs: There were no vitals taken for this visit.  Physical Exam  Ortho Exam  Specialty Comments:  No specialty comments available.  Imaging: No results found.   PMFS History: Patient Active Problem List   Diagnosis Date Noted  . Thoracic disc herniation 10/01/2021  . Cholelithiasis 08/05/2021  . Left flank pain 07/22/2021  . Acute medial meniscus tear, right, initial encounter 02/19/2021  . Synovitis of knee 02/19/2021  . Erectile dysfunction 10/25/2020  . Elevated LFTs 06/11/2020  . DOE (dyspnea on exertion) 06/11/2020  . Elevated LDL cholesterol level 03/12/2020  . Cervical strain 03/12/2020  . COMMON MIGRAINE 05/17/2007   Past Medical History:  Diagnosis Date  . COVID 03/2020  . MMT (medial meniscus tear)    right    Family History  Problem Relation Age of Onset  . Diabetes Mother   . Hyperlipidemia Mother   . Hypertension Mother   . Stroke  Father   . Healthy Sister   . Healthy Brother   . Colon polyps Neg Hx   . Colon cancer Neg Hx   . Esophageal cancer Neg Hx   . Rectal cancer Neg Hx   . Stomach cancer Neg Hx     Past Surgical History:  Procedure Laterality Date  . KNEE ARTHROSCOPY WITH MEDIAL MENISECTOMY Right 02/19/2021   Procedure: RIGHT KNEE ARTHROSCOPY WITH PARTIAL MEDIAL MENISECTOMY;  Surgeon: Leandrew Koyanagi, MD;  Location: [facility];  Service: Orthopedics;  Laterality: Right;  . WISDOM TOOTH EXTRACTION     age 56   Social History   Occupational History  . Not on file  Tobacco Use  . Smoking status: Never  . Smokeless tobacco: Never  Vaping Use  . Vaping Use: Never used  Substance and Sexual Activity  . Alcohol use: Not Currently    Comment: none since 03-2020  . Drug use: No  . Sexual activity: Yes

## 2022-05-26 ENCOUNTER — Telehealth: Payer: Self-pay | Admitting: Orthopaedic Surgery

## 2022-05-26 DIAGNOSIS — M545 Low back pain, unspecified: Secondary | ICD-10-CM

## 2022-05-26 NOTE — Telephone Encounter (Signed)
Pt's wife called requesting a refill of diclofenac. She also would like a call back from [employer]. About pt's visit. Please send send refill to [facility] on [address]. Please call pt's wife at [phone].

## 2022-05-27 NOTE — Telephone Encounter (Signed)
I talked with patient and wife. They wish to proceed with lumbar MRI as well. Order has been placed. They will have both scans obtained and follow up with Dr Lorin Mercy to review. Discussed with them also the concerns they had regarding their visit with Jeneen Rinks, [place].   Sending to you as FYI that patient will be coming back to see Lorin Mercy only.

## 2022-05-27 NOTE — Telephone Encounter (Signed)
noted 

## 2022-05-28 ENCOUNTER — Telehealth: Payer: Self-pay | Admitting: Orthopaedic Surgery

## 2022-05-28 ENCOUNTER — Other Ambulatory Visit: Payer: Self-pay

## 2022-05-28 MED ORDER — DICLOFENAC SODIUM 75 MG PO TBEC: DELAYED_RELEASE_TABLET | ORAL | 0 refills | Status: DC

## 2022-05-28 NOTE — Telephone Encounter (Signed)
noted 

## 2022-05-28 NOTE — Telephone Encounter (Signed)
Ic advised done

## 2022-05-28 NOTE — Telephone Encounter (Signed)
Pt wife called and states that her husband medication was never sent in and she wants to be called when it is sent it!!!  CB [phone]

## 2022-06-03 ENCOUNTER — Encounter: Payer: Self-pay | Admitting: Orthopaedic Surgery

## 2022-06-03 ENCOUNTER — Ambulatory Visit: Payer: 59 | Admitting: Orthopaedic Surgery

## 2022-06-03 VITALS — BP 134/89 | HR 56 | Ht 66.0 in | Wt 167.0 lb

## 2022-06-03 DIAGNOSIS — M5136 Other intervertebral disc degeneration, lumbar region: Secondary | ICD-10-CM | POA: Diagnosis not present

## 2022-06-03 DIAGNOSIS — M5124 Other intervertebral disc displacement, thoracic region: Secondary | ICD-10-CM

## 2022-06-03 NOTE — Progress Notes (Signed)
Office Visit Note   Patient: Jared Park.           Date of Birth: 06-Aug-1966           MRN: [id number] Visit Date: 06/03/2022              Requested by: [name], MD [address] [place],  [place] [address] PCP: [name], MD   Assessment & Plan: Visit Diagnoses:  1. Thoracic disc herniation   2. Other intervertebral disc degeneration, lumbar region     Plan: We can cancel MRI lumbar spine and T-spine.  Recheck 4 weeks.  If he continues to improve and is not having symptoms he can cancel his appointment.  Follow-Up Instructions: No follow-ups on file.   Orders:  No orders of the defined types were placed in this encounter.  No orders of the defined types were placed in this encounter.     Procedures: No procedures performed   Clinical Data: No additional findings.   Subjective: Chief Complaint  Patient presents with   Lower Back - Pain, Follow-up    HPI 56 year old male had MRI scan recommended T-spine and L-spine but states he has not been scheduled yet and has been back at work and states he got over 50% pain relief.  He has had some discomfort at the top of his legs little bit left and right.  He is taking gabapentin and diclofenac and wants to know if he can defer the MRI currently.  Patient drives a tow truck.  Previous MRI scan 06/12/2021 showed L5-S1 and S1-S2 disc degeneration with protrusions but without nerve compression.  There was small left paracentral extrusion at T10-11 with some left T10 Nerve root compression.  Review of Systems all systems updated unchanged.   Objective: Vital Signs: BP 134/89   Pulse (!) 56   Ht '5\' 6"'$  (1.676 m)   Wt 167 lb (75.8 kg)   BMI 26.95 kg/m   Physical Exam Constitutional:      Appearance: He is well-developed.  HENT:     Head: Normocephalic and atraumatic.     Right Ear: External ear normal.     Left Ear: External ear normal.  Eyes:     Pupils: Pupils are equal,  round, and reactive to light.  Neck:     Thyroid: No thyromegaly.     Trachea: No tracheal deviation.  Cardiovascular:     Rate and Rhythm: Normal rate.  Pulmonary:     Effort: Pulmonary effort is normal.     Breath sounds: No wheezing.  Abdominal:     General: Bowel sounds are normal.     Palpations: Abdomen is soft.  Musculoskeletal:     Cervical back: Neck supple.  Skin:    General: Skin is warm and dry.     Capillary Refill: Capillary refill takes less than 2 seconds.  Neurological:     Mental Status: He is alert and oriented to person, place, and time.  Psychiatric:        Behavior: Behavior normal.        Thought Content: Thought content normal.        Judgment: Judgment normal.     Ortho Exam negative logroll the hip she is able to heel and toe walk gets rapidly from sitting to standing.  Good lumbar range of motion.  Specialty Comments:  No specialty comments available.  Imaging: No results found.   PMFS History: Patient Active Problem List  Diagnosis Date Noted   Other intervertebral disc degeneration, lumbar region 06/03/2022   Thoracic disc herniation 10/01/2021   Cholelithiasis 08/05/2021   Left flank pain 07/22/2021   Acute medial meniscus tear, right, initial encounter 02/19/2021   Synovitis of knee 02/19/2021   Erectile dysfunction 10/25/2020   Elevated LFTs 06/11/2020   DOE (dyspnea on exertion) 06/11/2020   Elevated LDL cholesterol level 03/12/2020   Cervical strain 03/12/2020   COMMON MIGRAINE 05/17/2007   Past Medical History:  Diagnosis Date   COVID 03/2020   MMT (medial meniscus tear)    right    Family History  Problem Relation Age of Onset   Diabetes Mother    Hyperlipidemia Mother    Hypertension Mother    Stroke Father    Healthy Sister    Healthy Brother    Colon polyps Neg Hx    Colon cancer Neg Hx    Esophageal cancer Neg Hx    Rectal cancer Neg Hx    Stomach cancer Neg Hx     Past Surgical History:  Procedure  Laterality Date   KNEE ARTHROSCOPY WITH MEDIAL MENISECTOMY Right 02/19/2021   Procedure: RIGHT KNEE ARTHROSCOPY WITH PARTIAL MEDIAL MENISECTOMY;  Surgeon: Leandrew Koyanagi, MD;  Location: [facility];  Service: Orthopedics;  Laterality: Right;   WISDOM TOOTH EXTRACTION     age 67   Social History   Occupational History   Not on file  Tobacco Use   Smoking status: Never   Smokeless tobacco: Never  Vaping Use   Vaping Use: Never used  Substance and Sexual Activity   Alcohol use: Not Currently    Comment: none since 03-2020   Drug use: No   Sexual activity: Yes

## 2022-06-23 ENCOUNTER — Telehealth: Payer: Self-pay | Admitting: Orthopaedic Surgery

## 2022-06-23 NOTE — Telephone Encounter (Signed)
Pt's wife called requesting a letter from the Dr. Lorin Mercy excusing him from jury duty. Please call pt's wife Delores at [phone].

## 2022-06-23 NOTE — Telephone Encounter (Signed)
Please advise 

## 2022-06-23 NOTE — Telephone Encounter (Signed)
Note entered. I called patient's wife and advised. Note put at front for pick up.

## 2022-07-01 ENCOUNTER — Ambulatory Visit: Payer: 59 | Admitting: Orthopaedic Surgery

## 2022-07-13 ENCOUNTER — Encounter: Payer: Self-pay | Admitting: Family Medicine

## 2022-07-13 ENCOUNTER — Ambulatory Visit: Payer: 59 | Admitting: Family Medicine

## 2022-07-13 VITALS — BP 108/70 | HR 58 | Temp 97.1°F | Ht 66.0 in | Wt 168.0 lb

## 2022-07-13 DIAGNOSIS — Z0000 Encounter for general adult medical examination without abnormal findings: Secondary | ICD-10-CM | POA: Diagnosis not present

## 2022-07-13 DIAGNOSIS — E7800 Pure hypercholesterolemia, unspecified: Secondary | ICD-10-CM | POA: Diagnosis not present

## 2022-07-13 DIAGNOSIS — K219 Gastro-esophageal reflux disease without esophagitis: Secondary | ICD-10-CM | POA: Diagnosis not present

## 2022-07-13 MED ORDER — PANTOPRAZOLE SODIUM 40 MG PO TBEC: 40.0000 mg | DELAYED_RELEASE_TABLET | Freq: Every day | ORAL | 3 refills | Status: DC

## 2022-07-13 NOTE — Progress Notes (Addendum)
Established Patient Office Visit  Subjective   Patient ID: Jared Park., male    DOB: [birth year]  Age: 56 y.o. MRN: [id number]  Chief Complaint  Patient presents with   Gastroesophageal Reflux    Reflux issues becoming worse causing some vomiting symptoms x 6 months. States that Protonix did not help much.     Gastroesophageal Reflux He complains of heartburn. He reports no abdominal pain, no chest pain or no nausea.   here for follow-up of reflux.  He was initially seen back in June at the hospital for this issue.  EKG and cardiac enzymes were normal.  He experiences severe burning up into his chest with increased burping.  PPIs have helped when he has been taking them.  He bought an over-the-counter PPI that has not helped as much.  There is no exertional component to his symptoms.  There has been no shortness of breath or diaphoresis.  He has vomited before with the symptoms.  He denies dysphagia.  He has regular exercise on his job.  He has missed his last dentist appointment.  He lives at home with his wife.  He is fasting today.    Review of Systems  Constitutional: Negative.  Negative for diaphoresis.  HENT: Negative.    Eyes:  Negative for blurred vision, discharge and redness.  Respiratory: Negative.  Negative for shortness of breath.   Cardiovascular: Negative.  Negative for chest pain and palpitations.  Gastrointestinal:  Positive for heartburn and vomiting. Negative for abdominal pain and nausea.  Genitourinary: Negative.   Musculoskeletal: Negative.  Negative for myalgias.  Skin:  Negative for rash.  Neurological:  Negative for tingling, loss of consciousness and weakness.  Endo/Heme/Allergies:  Negative for polydipsia.      Objective:     BP 108/70 (BP Location: Right Arm, Patient Position: Sitting, Cuff Size: Normal)   Pulse (!) 58   Temp (!) 97.1 F (36.2 C) (Temporal)   Ht '5\' 6"'$  (1.676 m)   Wt 168 lb (76.2 kg)   SpO2 97%   BMI 27.12 kg/m  Wt  Readings from Last 3 Encounters:  07/13/22 168 lb (76.2 kg)  06/03/22 167 lb (75.8 kg)  02/18/22 170 lb (77.1 kg)      Physical Exam Constitutional:      General: He is not in acute distress.    Appearance: Normal appearance. He is not ill-appearing, toxic-appearing or diaphoretic.  HENT:     Head: Normocephalic and atraumatic.     Right Ear: External ear normal.     Left Ear: External ear normal.     Mouth/Throat:     Mouth: Mucous membranes are moist.     Pharynx: Oropharynx is clear. No oropharyngeal exudate or posterior oropharyngeal erythema.  Eyes:     General: No scleral icterus.       Right eye: No discharge.        Left eye: No discharge.     Extraocular Movements: Extraocular movements intact.     Conjunctiva/sclera: Conjunctivae normal.     Pupils: Pupils are equal, round, and reactive to light.  Cardiovascular:     Rate and Rhythm: Normal rate and regular rhythm.  Pulmonary:     Effort: Pulmonary effort is normal. No respiratory distress.     Breath sounds: Normal breath sounds.  Abdominal:     General: Bowel sounds are normal. There is no distension.     Tenderness: There is no abdominal tenderness. There is no guarding or  rebound.  Musculoskeletal:     Cervical back: No rigidity or tenderness.  Skin:    General: Skin is warm and dry.  Neurological:     Mental Status: He is alert and oriented to person, place, and time.  Psychiatric:        Mood and Affect: Mood normal.        Behavior: Behavior normal.      Results for orders placed or performed in visit on 07/13/22  CBC  Result Value Ref Range   WBC 3.3 (L) 4.0 - 10.5 K/uL   RBC 4.86 4.22 - 5.81 Mil/uL   Platelets 265.0 150.0 - 400.0 K/uL   Hemoglobin 13.5 13.0 - 17.0 g/dL   HCT 42.0 39.0 - 52.0 %   MCV 86.5 78.0 - 100.0 fl   MCHC 32.1 30.0 - 36.0 g/dL   RDW 13.7 11.5 - 15.5 %  Comprehensive metabolic panel  Result Value Ref Range   Sodium 140 135 - 145 mEq/L   Potassium 4.3 3.5 - 5.1 mEq/L    Chloride 102 96 - 112 mEq/L   CO2 35 (H) 19 - 32 mEq/L   Glucose, Bld 85 70 - 99 mg/dL   BUN 9 6 - 23 mg/dL   Creatinine, Ser 1.03 0.40 - 1.50 mg/dL   Total Bilirubin 0.8 0.2 - 1.2 mg/dL   Alkaline Phosphatase 71 39 - 117 U/L   AST 12 0 - 37 U/L   ALT 12 0 - 53 U/L   Total Protein 7.0 6.0 - 8.3 g/dL   Albumin 4.1 3.5 - 5.2 g/dL   GFR 82.60 >60.00 mL/min   Calcium 9.2 8.4 - 10.5 mg/dL  Amylase  Result Value Ref Range   Amylase 47 27 - 131 U/L  PSA  Result Value Ref Range   PSA 0.33 0.10 - 4.00 ng/mL  Urinalysis, Routine w reflex microscopic  Result Value Ref Range   Color, Urine YELLOW Yellow;Lt. Yellow;Straw;Dark Yellow;Amber;Green;Red;Brown   APPearance CLEAR Clear;Turbid;Slightly Cloudy;Cloudy   Specific Gravity, Urine 1.025 1.000 - 1.030   pH 6.0 5.0 - 8.0   Total Protein, Urine NEGATIVE Negative   Urine Glucose NEGATIVE Negative   Ketones, ur NEGATIVE Negative   Bilirubin Urine NEGATIVE Negative   Hgb urine dipstick NEGATIVE Negative   Urobilinogen, UA 0.2 0.0 - 1.0   Leukocytes,Ua NEGATIVE Negative   Nitrite NEGATIVE Negative   WBC, UA 0-2/hpf 0-2/hpf   RBC / HPF none seen 0-2/hpf  Lipid panel  Result Value Ref Range   Cholesterol 256 (H) 0 - 200 mg/dL   Triglycerides 130.0 0.0 - 149.0 mg/dL   HDL 51.40 >39.00 mg/dL   VLDL 26.0 0.0 - 40.0 mg/dL   LDL Cholesterol 178 (H) 0 - 99 mg/dL   Total CHOL/HDL Ratio 5    NonHDL 204.26       The 10-year ASCVD risk score (Arnett DK, et al., 2019) is: 5.1%    Assessment & Plan:   Problem List Items Addressed This Visit       Other   Elevated LDL cholesterol level   Relevant Medications   atorvastatin (LIPITOR) 20 MG tablet   Other Relevant Orders   Lipid panel (Completed)   Other Visit Diagnoses     Healthcare maintenance    -  Primary   Relevant Orders   PSA (Completed)   Gastroesophageal reflux disease, unspecified whether esophagitis present       Relevant Medications   pantoprazole (PROTONIX) 40 MG  tablet  Other Relevant Orders   CBC (Completed)   Comprehensive metabolic panel (Completed)   Amylase (Completed)   Urinalysis, Routine w reflex microscopic (Completed)   EKG 12-Lead (Completed)       Return in about 1 month (around 08/12/2022).  Fasting labs for health maintenance.  Encouraged regular dental care and exercise.  Follow-up in 3 months for recheck.  EKG today showed normal sinus rhythm without ST segment changes or T wave inversions.  Information was given on preventing high cholesterol and reflux disease.  He was also given information on health maintenance.  He is scheduled for a flu shot in the near future.  [name], MD

## 2022-07-14 ENCOUNTER — Encounter: Payer: Self-pay | Admitting: Physician Assistant

## 2022-07-14 LAB — COMPREHENSIVE METABOLIC PANEL
ALT: 12 U/L (ref 0–53)
AST: 12 U/L (ref 0–37)
Albumin: 4.1 g/dL (ref 3.5–5.2)
Alkaline Phosphatase: 71 U/L (ref 39–117)
BUN: 9 mg/dL (ref 6–23)
CO2: 35 mEq/L — ABNORMAL HIGH (ref 19–32)
Calcium: 9.2 mg/dL (ref 8.4–10.5)
Chloride: 102 mEq/L (ref 96–112)
Creatinine, Ser: 1.03 mg/dL (ref 0.40–1.50)
GFR: 82.6 mL/min (ref >60.00)
Glucose, Bld: 85 mg/dL (ref 70–99)
Potassium: 4.3 mEq/L (ref 3.5–5.1)
Sodium: 140 mEq/L (ref 135–145)
Total Bilirubin: 0.8 mg/dL (ref 0.2–1.2)
Total Protein: 7 g/dL (ref 6.0–8.3)

## 2022-07-14 LAB — LIPID PANEL
Cholesterol: 256 mg/dL — ABNORMAL HIGH (ref 0–200)
HDL: 51.4 mg/dL (ref >39.00)
LDL Cholesterol: 178 mg/dL — ABNORMAL HIGH (ref 0–99)
NonHDL: 204.26
Total CHOL/HDL Ratio: 5
Triglycerides: 130 mg/dL (ref 0.0–149.0)
VLDL: 26 mg/dL (ref 0.0–40.0)

## 2022-07-14 LAB — CBC
HCT: 42 % (ref 39.0–52.0)
Hemoglobin: 13.5 g/dL (ref 13.0–17.0)
MCHC: 32.1 g/dL (ref 30.0–36.0)
MCV: 86.5 fl (ref 78.0–100.0)
Platelets: 265 10*3/uL (ref 150.0–400.0)
RBC: 4.86 Mil/uL (ref 4.22–5.81)
RDW: 13.7 % (ref 11.5–15.5)
WBC: 3.3 10*3/uL — ABNORMAL LOW (ref 4.0–10.5)

## 2022-07-14 LAB — URINALYSIS, ROUTINE W REFLEX MICROSCOPIC
Bilirubin Urine: NEGATIVE
Hgb urine dipstick: NEGATIVE
Ketones, ur: NEGATIVE
Leukocytes,Ua: NEGATIVE
Nitrite: NEGATIVE
RBC / HPF: NONE SEEN (ref 0–?)
Specific Gravity, Urine: 1.025 (ref 1.000–1.030)
Total Protein, Urine: NEGATIVE
Urine Glucose: NEGATIVE
Urobilinogen, UA: 0.2 (ref 0.0–1.0)
pH: 6 (ref 5.0–8.0)

## 2022-07-14 LAB — PSA: PSA: 0.33 ng/mL (ref 0.10–4.00)

## 2022-07-14 LAB — AMYLASE: Amylase: 47 U/L (ref 27–131)

## 2022-07-15 ENCOUNTER — Other Ambulatory Visit: Payer: Self-pay

## 2022-07-15 DIAGNOSIS — N529 Male erectile dysfunction, unspecified: Secondary | ICD-10-CM

## 2022-07-15 MED ORDER — TADALAFIL 20 MG PO TABS: 10.0000 mg | ORAL_TABLET | ORAL | 11 refills | Status: DC | PRN

## 2022-07-20 MED ORDER — ATORVASTATIN CALCIUM 20 MG PO TABS: 20.0000 mg | ORAL_TABLET | Freq: Every day | ORAL | 3 refills | Status: DC

## 2022-07-20 NOTE — Addendum Note (Signed)
Addended by: Jon Billings on: 07/20/2022 10:22 AM   Modules accepted: Orders

## 2022-07-28 LAB — HM DIABETES EYE EXAM

## 2022-08-06 ENCOUNTER — Ambulatory Visit: Payer: 59 | Admitting: Orthopaedic Surgery

## 2022-08-06 ENCOUNTER — Encounter: Payer: Self-pay | Admitting: Orthopaedic Surgery

## 2022-08-06 ENCOUNTER — Ambulatory Visit (INDEPENDENT_AMBULATORY_CARE_PROVIDER_SITE_OTHER): Payer: 59

## 2022-08-06 DIAGNOSIS — M7711 Lateral epicondylitis, right elbow: Secondary | ICD-10-CM | POA: Diagnosis not present

## 2022-08-06 MED ORDER — METHYLPREDNISOLONE ACETATE 40 MG/ML IJ SUSP
40.0000 mg | INTRAMUSCULAR | Status: AC | PRN
  Administered 2022-08-06: 40 mg via INTRA_ARTICULAR

## 2022-08-06 MED ORDER — BUPIVACAINE HCL 0.5 % IJ SOLN
1.0000 mL | INTRAMUSCULAR | Status: AC | PRN
  Administered 2022-08-06: 1 mL via INTRA_ARTICULAR

## 2022-08-06 MED ORDER — LIDOCAINE HCL 1 % IJ SOLN
1.0000 mL | INTRAMUSCULAR | Status: AC | PRN
  Administered 2022-08-06: 1 mL

## 2022-08-06 NOTE — Progress Notes (Signed)
Office Visit Note   Patient: Jared Park.           Date of Birth: 1966-07-01           MRN: [id number] Visit Date: 08/06/2022              Requested by: [name], MD [address] [place],  [place] [address] PCP: [name], MD   Assessment & Plan: Visit Diagnoses:  1. Lateral epicondylitis of right elbow     Plan: Impression is right elbow lateral epicondylitis.  Today, we discussed proceeding with tennis elbow strap and exercises in addition to cortisone injection versus NSAIDs.  He is agreeable to this plan and would prefer the injection over NSAIDs.  He will follow-up with [place] as needed.  Follow-Up Instructions: Return if symptoms worsen or fail to improve.   Orders:  Orders Placed This Encounter  Procedures   XR Elbow 2 Views Right   No orders of the defined types were placed in this encounter.     Procedures: Medium Joint Inj: R lateral epicondyle on 08/06/2022 10:46 AM Details: 22 G needle Medications: 1 mL lidocaine 1 %; 40 mg methylPREDNISolone acetate 40 MG/ML; 1 mL bupivacaine 0.5 % Outcome: tolerated well, no immediate complications      Clinical Data: No additional findings.   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  He denies any injury or change in activity leading to his symptoms.  Symptoms have not worsened but have not improved.  The pain is to the entire elbow worse when he is moving in certain ways.  He denies any weakness to the right upper extremity.  He does not take medication for this.  He denies any paresthesias.    Review of Systems as detailed in HPI.  All others reviewed and are negative.   Objective: Vital Signs: There were no vitals taken for this visit.  Physical Exam well-developed well-nourished gentleman in no acute distress.  Alert and  oriented x 3.  Ortho Exam right elbow exam she has moderate tenderness to the lateral epicondyle.  No tenderness to the radial tunnel or medial epicondyle.  He has mild tenderness to the olecranon.  Painless elbow flexion and extension.  No pain or weakness with resisted elbow extension.  He has slight pain with elbow supination and resisted long finger extension.  He has pain with shaking my hand.  He is neurovascular intact distally.  Specialty Comments:  No specialty comments available.  Imaging: XR Elbow 2 Views Right  Result Date: 08/06/2022 X-rays demonstrate a large olecranon osteophyte.    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   Cholelithiasis 08/05/2021   Left flank pain 07/22/2021   Acute medial meniscus tear, right, initial encounter 02/19/2021   Synovitis of knee 02/19/2021   Erectile dysfunction 10/25/2020   Elevated LFTs 06/11/2020   DOE (dyspnea on exertion) 06/11/2020   Elevated LDL cholesterol level 03/12/2020   Cervical strain 03/12/2020   COMMON MIGRAINE 05/17/2007   Past Medical History:  Diagnosis Date   COVID 03/2020   MMT (medial meniscus tear)    right    Family History  Problem Relation Age of Onset   Diabetes  Mother    Hyperlipidemia Mother    Hypertension Mother    Stroke Father    Healthy Sister    Healthy Brother    Colon polyps Neg Hx    Colon cancer Neg Hx    Esophageal cancer Neg Hx    Rectal cancer Neg Hx    Stomach cancer Neg Hx     Past Surgical History:  Procedure Laterality Date   KNEE ARTHROSCOPY WITH MEDIAL MENISECTOMY Right 02/19/2021   Procedure: RIGHT KNEE ARTHROSCOPY WITH PARTIAL MEDIAL MENISECTOMY;  Surgeon: Leandrew Koyanagi, MD;  Location: [facility];  Service: Orthopedics;  Laterality: Right;   WISDOM TOOTH EXTRACTION     age 49   Social History   Occupational History   Not on file  Tobacco  Use   Smoking status: Never   Smokeless tobacco: Never  Vaping Use   Vaping Use: Never used  Substance and Sexual Activity   Alcohol use: Not Currently    Comment: none since 03-2020   Drug use: No   Sexual activity: Yes

## 2022-08-12 ENCOUNTER — Ambulatory Visit: Payer: 59 | Admitting: Family Medicine

## 2022-08-12 ENCOUNTER — Encounter: Payer: Self-pay | Admitting: Family Medicine

## 2022-08-12 VITALS — BP 120/72 | HR 65 | Temp 97.1°F | Ht 66.0 in | Wt 169.4 lb

## 2022-08-12 DIAGNOSIS — E7800 Pure hypercholesterolemia, unspecified: Secondary | ICD-10-CM

## 2022-08-12 DIAGNOSIS — K219 Gastro-esophageal reflux disease without esophagitis: Secondary | ICD-10-CM | POA: Diagnosis not present

## 2022-08-12 MED ORDER — PANTOPRAZOLE SODIUM 40 MG PO TBEC: 40.0000 mg | DELAYED_RELEASE_TABLET | Freq: Every day | ORAL | 3 refills | Status: DC

## 2022-08-12 MED ORDER — ATORVASTATIN CALCIUM 20 MG PO TABS: 20.0000 mg | ORAL_TABLET | Freq: Every day | ORAL | 3 refills | Status: DC

## 2022-08-12 NOTE — Progress Notes (Signed)
Established Patient Office Visit   Subjective:  Patient ID: Jared Park., male    DOB: 08/31/1966  Age: 56 y.o. MRN: [id number]  Chief Complaint  Patient presents with   Follow-up    1 month follow, per patient reflux have gotten better.     HPI Encounter Diagnoses  Name Primary?   Gastroesophageal reflux disease, unspecified whether esophagitis present Yes   Elevated LDL cholesterol level    Protonix has helped a great deal.  It has relieved his reflux symptoms.  He notes that certain foods such as tomatoes and onions may set it off.  He also knows that retiring soon after the evening meal can also cause problems.  Doing well with the atorvastatin.  He is having no issues taking it.   Review of Systems  Constitutional: Negative.   HENT: Negative.    Eyes:  Negative for blurred vision, discharge and redness.  Respiratory: Negative.    Cardiovascular: Negative.   Gastrointestinal:  Negative for abdominal pain.  Genitourinary: Negative.   Musculoskeletal: Negative.  Negative for myalgias.  Skin:  Negative for rash.  Neurological:  Negative for tingling, loss of consciousness and weakness.  Endo/Heme/Allergies:  Negative for polydipsia.     Current Outpatient Medications:    acetaminophen-codeine (TYLENOL #3) 300-30 MG tablet, Take 1 tablet by mouth every 12 (twelve) hours as needed for moderate pain., Disp: 30 tablet, Rfl: 0   tadalafil (CIALIS) 20 MG tablet, Take 0.5-1 tablets (10-20 mg total) by mouth every other day as needed for erectile dysfunction., Disp: 5 tablet, Rfl: 11   atorvastatin (LIPITOR) 20 MG tablet, Take 1 tablet (20 mg total) by mouth daily., Disp: 90 tablet, Rfl: 3   pantoprazole (PROTONIX) 40 MG tablet, Take 1 tablet (40 mg total) by mouth daily., Disp: 90 tablet, Rfl: 3   Objective:     BP 120/72 (BP Location: Right Arm, Patient Position: Sitting, Cuff Size: Normal)   Pulse 65   Temp (!) 97.1 F (36.2 C) (Temporal)   Ht '5\' 6"'$  (1.676 m)   Wt  169 lb 6.4 oz (76.8 kg)   SpO2 97%   BMI 27.34 kg/m    Physical Exam Constitutional:      General: He is not in acute distress.    Appearance: Normal appearance. He is not ill-appearing, toxic-appearing or diaphoretic.  HENT:     Head: Normocephalic and atraumatic.     Right Ear: External ear normal.     Left Ear: External ear normal.  Eyes:     General: No scleral icterus.       Right eye: No discharge.        Left eye: No discharge.     Extraocular Movements: Extraocular movements intact.     Conjunctiva/sclera: Conjunctivae normal.  Cardiovascular:     Rate and Rhythm: Normal rate and regular rhythm.  Pulmonary:     Effort: Pulmonary effort is normal. No respiratory distress.     Breath sounds: Normal breath sounds.  Skin:    General: Skin is warm and dry.  Neurological:     Mental Status: He is alert and oriented to person, place, and time.  Psychiatric:        Mood and Affect: Mood normal.        Behavior: Behavior normal.      No results found for any visits on 08/12/22.    The 10-year ASCVD risk score (Arnett DK, et al., 2019) is: 6.1%    Assessment &  Plan:   Gastroesophageal reflux disease, unspecified whether esophagitis present -     Pantoprazole Sodium; Take 1 tablet (40 mg total) by mouth daily.  Dispense: 90 tablet; Refill: 3  Elevated LDL cholesterol level -     Atorvastatin Calcium; Take 1 tablet (20 mg total) by mouth daily.  Dispense: 90 tablet; Refill: 3    Return Follow-up in November for physical..  Continue Protonix and atorvastatin.  Information was given on food choices GERD.  Patient was given on preventing high cholesterol.  Explained that his ASCVD risk score is low, he is at higher risk for vascular disease because of his elevated cholesterol. [name], MD

## 2022-08-13 NOTE — Progress Notes (Signed)
08/17/2022 Jared Park [id number] [birth year]  Referring provider: [name],* Primary GI doctor: Dr. Henrene Pastor  ASSESSMENT AND PLAN:   Gastroesophageal worse x 6 months with melena and vomiting non bloody emesis, mild improvement with pantoprazole once daily Lifestyle changes discussed, avoid NSAIDS, ETOH Will increase PPI to twice daily, emphasizing before food. Will schedule EGD to evaluate for possible H. pylori, esophagitis, gastritis, peptic ulcer disease, etc..  Does not sound colicky but if EGD negative or LFTs elevated will schedule for repeat AB [place] versus HIDA with history of cholelithiasis.  Check Cbc with melena I discussed risks of EGD with patient today, including risk of sedation, bleeding or perforation.  Patient provides understanding and gave verbal consent to proceed.  History of adenomatous polyp of colon Recall 2027 unless change in bowel habits, or bleeding.   Calculus of gallbladder without cholecystitis without obstruction Patient has known cholelithiasis without inflammation.  Discussed symptoms of cholecystitis and reasons to go to the ER.  Advised low fat diet.  Get labs, consider repeat pending labs/EGD  Patient Care Team: [name], MD as PCP - General (Family Medicine)  HISTORY OF PRESENT ILLNESS: 56 y.o. male with a past medical history of hyperlipidemia, GERD, and others listed below presents for evaluation of GERD and vomiting.   05/25/2019 colonoscopy with Dr. Henrene Pastor for screening purposes, excellent bowel prep.  3 polyps 1 to 3 mm in rectum transverse colon ascending colon.  Diverticulosis otherwise unremarkable.  Pathology showed 1 tubular adenoma otherwise unremarkable.  Recall 7 years 05/2026 he is due.   He went to ER in June for non specific chest pain, thought to be GERD related, that is when symptoms got worse.   Patient states he has been having intermittent GERD for a year, worse for 6 months States  worse with spaghetti, spicy foods. Not worse with fatty foods.  He can have vomiting if it is severe, non bloody.  Worse later in the evening, can have nocturnal symptoms that will wake him up or keep him up. Will eat dinner 8-9 pm, off work at [employer], goes to be at 12 pm/1 am, occ oreos before bed.  He has been having dark/black stool intermittent, no pepto/iron pills. Has been on stool softener.  Feels acid/fullness substernal chest, feels like something didn't go down but denies issues with dysphagia or odynophagia.  Bed not elevated.  Will drink mylanta or water and this helps.  Seen by primary care for GERD 11/13, start on pantoprazole 40 mg once in the morning can be hours before eating, once daily with improvement of symptoms but still having night time symptoms.   Labs from 07/13/2022 reviewed showed normal liver function normal kidney, Platelets 265, hemoglobin 13.5, MCV 86.5 07/2021 abdominal ultrasound for flank pain cholelithiasis shadowing gallstone at the gall bladder neck 1.2 cm no acute cholecystitis DBD 0.3 cm Denies NSAIDS, no ETOH. No family history of GB or stomach issues.  No smoking history.    He  reports that he has never smoked. He has never used smokeless tobacco. He reports that he does not currently use alcohol. He reports that he does not use drugs.  Current Medications:    Current Outpatient Medications (Cardiovascular):    atorvastatin (LIPITOR) 20 MG tablet, Take 1 tablet (20 mg total) by mouth daily.   tadalafil (CIALIS) 20 MG tablet, Take 0.5-1 tablets (10-20 mg total) by mouth every other day as needed for erectile dysfunction.   Current Outpatient Medications (  Analgesics):    acetaminophen-codeine (TYLENOL #3) 300-30 MG tablet, Take 1 tablet by mouth every 12 (twelve) hours as needed for moderate pain. (Patient not taking: Reported on 08/17/2022)   Current Outpatient Medications (Other):    pantoprazole (PROTONIX) 40 MG tablet, Take 1 tablet (40 mg  total) by mouth 2 (two) times daily before a meal.  Medical History:  Past Medical History:  Diagnosis Date   COVID 03/2020   MMT (medial meniscus tear)    right   Allergies: No Known Allergies   Surgical History:  He  has a past surgical history that includes Wisdom tooth extraction and Knee arthroscopy with medial menisectomy (Right, 02/19/2021). Family History:  His family history includes Diabetes in his mother; Healthy in his brother and sister; Hyperlipidemia in his mother; Hypertension in his mother; Stroke in his father.  REVIEW OF SYSTEMS  : All other systems reviewed and negative except where noted in the History of Present Illness.  PHYSICAL EXAM: BP 130/72   Pulse (!) 59   Ht '5\' 6"'$  (1.676 m)   Wt 169 lb (76.7 kg)   BMI 27.28 kg/m  General:   Pleasant, well developed male in no acute distress Head:   Normocephalic and atraumatic. Eyes:  sclerae anicteric,conjunctive pink  Heart:   regular rate and rhythm Pulm:  Clear anteriorly; no wheezing Abdomen:   Soft, Flat AB, Active bowel sounds. mild tenderness in the epigastrium. Without guarding and Without rebound, No organomegaly appreciated. Rectal: Not evaluated Extremities:  Without edema. Msk: Symmetrical without gross deformities. Peripheral pulses intact.  Neurologic:  Alert and  oriented x4;  No focal deficits.  Skin:   Dry and intact without significant lesions or rashes. Psychiatric:  Cooperative. Normal mood and affect.  RELEVANT LABS AND IMAGING: CBC    Component Value Date/Time   WBC 3.3 (L) 07/13/2022 1555   RBC 4.86 07/13/2022 1555   HGB 13.5 07/13/2022 1555   HGB 13.7 02/23/2018 1203   HCT 42.0 07/13/2022 1555   HCT 42.0 02/23/2018 1203   PLT 265.0 07/13/2022 1555   PLT 310 02/23/2018 1203   MCV 86.5 07/13/2022 1555   MCV 85 02/23/2018 1203   MCH 27.9 02/18/2022 0658   MCHC 32.1 07/13/2022 1555   RDW 13.7 07/13/2022 1555   RDW 13.8 02/23/2018 1203   LYMPHSABS 0.5 (L) 04/21/2020 0915    LYMPHSABS 1.5 02/23/2018 1203   MONOABS 0.1 04/21/2020 0915   EOSABS 0.0 04/21/2020 0915   EOSABS 0.0 02/23/2018 1203   BASOSABS 0.0 04/21/2020 0915   BASOSABS 0.0 02/23/2018 1203    CMP     Component Value Date/Time   NA 140 07/13/2022 1555   NA 140 02/23/2018 1203   K 4.3 07/13/2022 1555   CL 102 07/13/2022 1555   CO2 35 (H) 07/13/2022 1555   GLUCOSE 85 07/13/2022 1555   BUN 9 07/13/2022 1555   BUN 10 02/23/2018 1203   CREATININE 1.03 07/13/2022 1555   CALCIUM 9.2 07/13/2022 1555   PROT 7.0 07/13/2022 1555   PROT 6.7 02/23/2018 1203   ALBUMIN 4.1 07/13/2022 1555   ALBUMIN 4.2 02/23/2018 1203   AST 12 07/13/2022 1555   ALT 12 07/13/2022 1555   ALKPHOS 71 07/13/2022 1555   BILITOT 0.8 07/13/2022 1555   BILITOT 0.4 02/23/2018 1203   GFRNONAA >60 02/18/2022 0658   GFRAA >60 04/21/2020 0915     Vladimir Crofts, PA-C 2:35 PM

## 2022-08-17 ENCOUNTER — Ambulatory Visit: Payer: 59 | Admitting: Physician Assistant

## 2022-08-17 ENCOUNTER — Encounter: Payer: Self-pay | Admitting: Physician Assistant

## 2022-08-17 ENCOUNTER — Other Ambulatory Visit (INDEPENDENT_AMBULATORY_CARE_PROVIDER_SITE_OTHER): Payer: 59

## 2022-08-17 VITALS — BP 130/72 | HR 59 | Ht 66.0 in | Wt 169.0 lb

## 2022-08-17 DIAGNOSIS — K8020 Calculus of gallbladder without cholecystitis without obstruction: Secondary | ICD-10-CM | POA: Diagnosis not present

## 2022-08-17 DIAGNOSIS — K219 Gastro-esophageal reflux disease without esophagitis: Secondary | ICD-10-CM | POA: Diagnosis not present

## 2022-08-17 DIAGNOSIS — K921 Melena: Secondary | ICD-10-CM | POA: Diagnosis not present

## 2022-08-17 DIAGNOSIS — Z86010 Personal history of colonic polyps: Secondary | ICD-10-CM | POA: Diagnosis not present

## 2022-08-17 DIAGNOSIS — R7989 Other specified abnormal findings of blood chemistry: Secondary | ICD-10-CM

## 2022-08-17 LAB — CBC WITH DIFFERENTIAL/PLATELET
Basophils Absolute: 0 10*3/uL (ref 0.0–0.1)
Basophils Relative: 0.7 % (ref 0.0–3.0)
Eosinophils Absolute: 0 10*3/uL (ref 0.0–0.7)
Eosinophils Relative: 0.7 % (ref 0.0–5.0)
HCT: 42.7 % (ref 39.0–52.0)
Hemoglobin: 13.9 g/dL (ref 13.0–17.0)
Lymphocytes Relative: 35.1 % (ref 12.0–46.0)
Lymphs Abs: 1.5 10*3/uL (ref 0.7–4.0)
MCHC: 32.6 g/dL (ref 30.0–36.0)
MCV: 85.9 fl (ref 78.0–100.0)
Monocytes Absolute: 0.3 10*3/uL (ref 0.1–1.0)
Monocytes Relative: 6.8 % (ref 3.0–12.0)
Neutro Abs: 2.4 10*3/uL (ref 1.4–7.7)
Neutrophils Relative %: 56.7 % (ref 43.0–77.0)
Platelets: 287 10*3/uL (ref 150.0–400.0)
RBC: 4.97 Mil/uL (ref 4.22–5.81)
RDW: 13.6 % (ref 11.5–15.5)
WBC: 4.2 10*3/uL (ref 4.0–10.5)

## 2022-08-17 LAB — COMPREHENSIVE METABOLIC PANEL
ALT: 11 U/L (ref 0–53)
AST: 11 U/L (ref 0–37)
Albumin: 4.2 g/dL (ref 3.5–5.2)
Alkaline Phosphatase: 74 U/L (ref 39–117)
BUN: 9 mg/dL (ref 6–23)
CO2: 29 mEq/L (ref 19–32)
Calcium: 9.2 mg/dL (ref 8.4–10.5)
Chloride: 105 mEq/L (ref 96–112)
Creatinine, Ser: 1.08 mg/dL (ref 0.40–1.50)
GFR: 77.98 mL/min (ref >60.00)
Glucose, Bld: 88 mg/dL (ref 70–99)
Potassium: 3.9 mEq/L (ref 3.5–5.1)
Sodium: 142 mEq/L (ref 135–145)
Total Bilirubin: 1.1 mg/dL (ref 0.2–1.2)
Total Protein: 7.5 g/dL (ref 6.0–8.3)

## 2022-08-17 MED ORDER — PANTOPRAZOLE SODIUM 40 MG PO TBEC: 40.0000 mg | DELAYED_RELEASE_TABLET | Freq: Two times a day (BID) | ORAL | 0 refills | Status: DC

## 2022-08-17 NOTE — Patient Instructions (Addendum)
_______________________________________________________  If you are age 54 or older, your body mass index should be between 23-30. Your Body mass index is 27.28 kg/m. If this is out of the aforementioned range listed, please consider follow up with your Primary Care Provider.  If you are age 70 or younger, your body mass index should be between 19-25. Your Body mass index is 27.28 kg/m. If this is out of the aformentioned range listed, please consider follow up with your Primary Care Provider.   ________________________________________________________  The [facility] providers would like to encourage you to use [facility] to communicate with providers for non-urgent requests or questions.  Due to long hold times on the telephone, sending your provider a message by [facility] may be a faster and more efficient way to get a response.  Please allow 48 business hours for a response.  Please remember that this is for non-urgent requests.  _______________________________________________________  Jared Park have been scheduled for an endoscopy. Please follow written instructions given to you at your visit today. If you use inhalers (even only as needed), please bring them with you on the day of your procedure.  Your provider has requested that you go to the basement level for lab work before leaving today. Press "B" on the elevator. The lab is located at the first door on the left as you exit the elevator.   Please take your proton pump inhibitor medication, pantoprazole 40 mg twice a day  Please take this medication 30 minutes to 1 hour before meals- this makes it more effective.  Avoid spicy and acidic foods Avoid fatty foods Limit your intake of coffee, tea, alcohol, and carbonated drinks Work to maintain a healthy weight Keep the head of the bed elevated at least 3 inches with blocks or a wedge pillow if you are having any nighttime symptoms Stay upright for 2 hours after eating Avoid meals and snacks  three to four hours before bedtime    Go to the ER if you have any yellowing of the skin/eyes, dark urine, severe AB pain, fever, chills, nausea or vomiting.    It was a pleasure to see you today!  Thank you for trusting me with your gastrointestinal care!

## 2022-08-21 NOTE — Progress Notes (Signed)
Assessment and plans noted ?

## 2022-08-26 ENCOUNTER — Encounter: Payer: Self-pay | Admitting: Family Medicine

## 2022-09-15 ENCOUNTER — Other Ambulatory Visit: Payer: Self-pay | Admitting: Family Medicine

## 2022-09-15 DIAGNOSIS — K219 Gastro-esophageal reflux disease without esophagitis: Secondary | ICD-10-CM

## 2022-09-21 ENCOUNTER — Ambulatory Visit (AMBULATORY_SURGERY_CENTER): Payer: 59 | Admitting: Internal Medicine

## 2022-09-21 ENCOUNTER — Encounter: Payer: Self-pay | Admitting: Internal Medicine

## 2022-09-21 VITALS — BP 110/75 | HR 57 | Temp 98.0°F | Resp 13 | Ht 66.0 in | Wt 169.0 lb

## 2022-09-21 DIAGNOSIS — R0789 Other chest pain: Secondary | ICD-10-CM | POA: Diagnosis not present

## 2022-09-21 DIAGNOSIS — K921 Melena: Secondary | ICD-10-CM | POA: Diagnosis not present

## 2022-09-21 DIAGNOSIS — K219 Gastro-esophageal reflux disease without esophagitis: Secondary | ICD-10-CM

## 2022-09-21 MED ORDER — SODIUM CHLORIDE 0.9 % IV SOLN: 500.0000 mL | Freq: Once | INTRAVENOUS | Status: DC

## 2022-09-21 NOTE — Patient Instructions (Signed)
Resume previous diet and continue present medications - you can decrease pantoprazole to once daily  Return to care of primary care provider    YOU HAD AN ENDOSCOPIC PROCEDURE TODAY AT [place]:   Refer to the procedure report that was given to you for any specific questions about what was found during the examination.  If the procedure report does not answer your questions, please call your gastroenterologist to clarify.  If you requested that your care partner not be given the details of your procedure findings, then the procedure report has been included in a sealed envelope for you to review at your convenience later.  YOU SHOULD EXPECT: Some feelings of bloating in the abdomen. Passage of more gas than usual.  Walking can help get rid of the air that was put into your GI tract during the procedure and reduce the bloating. If you had a lower endoscopy (such as a colonoscopy or flexible sigmoidoscopy) you may notice spotting of blood in your stool or on the toilet paper. If you underwent a bowel prep for your procedure, you may not have a normal bowel movement for a few days.  Please Note:  You might notice some irritation and congestion in your nose or some drainage.  This is from the oxygen used during your procedure.  There is no need for concern and it should clear up in a day or so.  SYMPTOMS TO REPORT IMMEDIATELY:  Following upper endoscopy (EGD)  Vomiting of blood or coffee ground material  New chest pain or pain under the shoulder blades  Painful or persistently difficult swallowing  New shortness of breath  Fever of 100F or higher  Black, tarry-looking stools  For urgent or emergent issues, a gastroenterologist can be reached at any hour by calling [phone]. Do not use MyChart messaging for urgent concerns.    DIET:  We do recommend a small meal at first, but then you may proceed to your regular diet.  Drink plenty of fluids but you should avoid  alcoholic beverages for 24 hours.  ACTIVITY:  You should plan to take it easy for the rest of today and you should NOT DRIVE or use heavy machinery until tomorrow (because of the sedation medicines used during the test).    FOLLOW UP: Our staff will call the number listed on your records the next business day following your procedure.  We will call around 7:15- 8:00 am to check on you and address any questions or concerns that you may have regarding the information given to you following your procedure. If we do not reach you, we will leave a message.     If any biopsies were taken you will be contacted by phone or by letter within the next 1-3 weeks.  Please call us at [phone] if you have not heard about the biopsies in 3 weeks.    SIGNATURES/CONFIDENTIALITY: You and/or your care partner have signed paperwork which will be entered into your electronic medical record.  These signatures attest to the fact that that the information above on your After Visit Summary has been reviewed and is understood.  Full responsibility of the confidentiality of this discharge information lies with you and/or your care-partner.

## 2022-09-21 NOTE — Progress Notes (Signed)
VS by DT  Pt's states no medical or surgical changes since previsit or office visit.  

## 2022-09-21 NOTE — Op Note (Signed)
[facility] Patient Name: Jared Park Procedure Date: 09/21/2022 10:17 AM MRN: [id number] Endoscopist: Docia Chuck. Henrene Pastor , MD, [id number] Age: 57 Referring MD:  Date of Birth: [birth year] Gender: Male Account #: [id number] Procedure:                Upper GI endoscopy Indications:              Unexplained chest pain (atypical). Question GERD,                            question transient melena. Laboratories                            unremarkable. No response to twice daily PPI for                            greater than 1 month. No dysphagia. Medicines:                Monitored Anesthesia Care Procedure:                Pre-Anesthesia Assessment:                           - Prior to the procedure, a History and Physical                            was performed, and patient medications and                            allergies were reviewed. The patient's tolerance of                            previous anesthesia was also reviewed. The risks                            and benefits of the procedure and the sedation                            options and risks were discussed with the patient.                            All questions were answered, and informed consent                            was obtained. Prior Anticoagulants: The patient has                            taken no anticoagulant or antiplatelet agents. ASA                            Grade Assessment: II - A patient with mild systemic                            disease. After reviewing the risks and benefits,  the patient was deemed in satisfactory condition to                            undergo the procedure.                           After obtaining informed consent, the endoscope was                            passed under direct vision. Throughout the                            procedure, the patient's blood pressure, pulse, and                            oxygen saturations were  monitored continuously. The                            Endoscope was introduced through the mouth, and                            advanced to the second part of duodenum. The upper                            GI endoscopy was accomplished without difficulty.                            The patient tolerated the procedure well. Scope In: Scope Out: Findings:                 The esophagus was normal.                           The stomach was normal.                           The examined duodenum was normal.                           The cardia and gastric fundus were normal on                            retroflexion. Complications:            No immediate complications. Estimated Blood Loss:     Estimated blood loss: none. Impression:               1. Normal EGD                           2. Atypical chest pain. Not felt to be GI in origin. Recommendation:           - Patient has a contact number available for                            emergencies. The signs and symptoms of potential  delayed complications were discussed with the                            patient. Return to normal activities tomorrow.                            Written discharge instructions were provided to the                            patient.                           - Resume previous diet.                           - Continue present medications. Okay to decrease                            pantoprazole to once daily.                           - Return to the care of your primary provider Docia Chuck. Henrene Pastor, MD 09/21/2022 10:46:46 AM This report has been signed electronically.

## 2022-09-21 NOTE — Progress Notes (Signed)
Sedate, gd SR, tolerated procedure well, VSS, report to RN 

## 2022-09-21 NOTE — Progress Notes (Signed)
Expand All Collapse All        08/17/2022 Jared Park [id number] Apr 30, 1966   Referring provider: [name],* Primary GI doctor: Dr. Henrene Pastor   ASSESSMENT AND PLAN:    Gastroesophageal worse x 6 months with melena and vomiting non bloody emesis, mild improvement with pantoprazole once daily Lifestyle changes discussed, avoid NSAIDS, ETOH Will increase PPI to twice daily, emphasizing before food. Will schedule EGD to evaluate for possible H. pylori, esophagitis, gastritis, peptic ulcer disease, etc..   Does not sound colicky but if EGD negative or LFTs elevated will schedule for repeat AB [place] versus HIDA with history of cholelithiasis.  Check Cbc with melena I discussed risks of EGD with patient today, including risk of sedation, bleeding or perforation.  Patient provides understanding and gave verbal consent to proceed.   History of adenomatous polyp of colon Recall 2027 unless change in bowel habits, or bleeding.    Calculus of gallbladder without cholecystitis without obstruction Patient has known cholelithiasis without inflammation.  Discussed symptoms of cholecystitis and reasons to go to the ER.  Advised low fat diet.  Get labs, consider repeat pending labs/EGD   Patient Care Team: [name], MD as PCP - General (Family Medicine)   HISTORY OF PRESENT ILLNESS: 57 y.o. male with a past medical history of hyperlipidemia, GERD, and others listed below presents for evaluation of GERD and vomiting.    05/25/2019 colonoscopy with Dr. Henrene Pastor for screening purposes, excellent bowel prep.  3 polyps 1 to 3 mm in rectum transverse colon ascending colon.  Diverticulosis otherwise unremarkable.  Pathology showed 1 tubular adenoma otherwise unremarkable.  Recall 7 years 05/2026 he is due.    He went to ER in June for non specific chest pain, thought to be GERD related, that is when symptoms got worse.    Patient states he has been having intermittent GERD  for a year, worse for 6 months States worse with spaghetti, spicy foods. Not worse with fatty foods.  He can have vomiting if it is severe, non bloody.  Worse later in the evening, can have nocturnal symptoms that will wake him up or keep him up. Will eat dinner 8-9 pm, off work at [employer], goes to be at 12 pm/1 am, occ oreos before bed.  He has been having dark/black stool intermittent, no pepto/iron pills. Has been on stool softener.  Feels acid/fullness substernal chest, feels like something didn't go down but denies issues with dysphagia or odynophagia.  Bed not elevated.  Will drink mylanta or water and this helps.  Seen by primary care for GERD 11/13, start on pantoprazole 40 mg once in the morning can be hours before eating, once daily with improvement of symptoms but still having night time symptoms.    Labs from 07/13/2022 reviewed showed normal liver function normal kidney, Platelets 265, hemoglobin 13.5, MCV 86.5 07/2021 abdominal ultrasound for flank pain cholelithiasis shadowing gallstone at the gall bladder neck 1.2 cm no acute cholecystitis DBD 0.3 cm Denies NSAIDS, no ETOH. No family history of GB or stomach issues.  No smoking history.      He  reports that he has never smoked. He has never used smokeless tobacco. He reports that he does not currently use alcohol. He reports that he does not use drugs.   Current Medications:      Current Outpatient Medications (Cardiovascular):    atorvastatin (LIPITOR) 20 MG tablet, Take 1 tablet (20 mg total) by mouth daily.   tadalafil (  CIALIS) 20 MG tablet, Take 0.5-1 tablets (10-20 mg total) by mouth every other day as needed for erectile dysfunction.     Current Outpatient Medications (Analgesics):    acetaminophen-codeine (TYLENOL #3) 300-30 MG tablet, Take 1 tablet by mouth every 12 (twelve) hours as needed for moderate pain. (Patient not taking: Reported on 08/17/2022)     Current Outpatient Medications (Other):    pantoprazole  (PROTONIX) 40 MG tablet, Take 1 tablet (40 mg total) by mouth 2 (two) times daily before a meal.   Medical History:      Past Medical History:  Diagnosis Date   COVID 03/2020   MMT (medial meniscus tear)      right    Allergies: No Known Allergies    Surgical History:  He  has a past surgical history that includes Wisdom tooth extraction and Knee arthroscopy with medial menisectomy (Right, 02/19/2021). Family History:  His family history includes Diabetes in his mother; Healthy in his brother and sister; Hyperlipidemia in his mother; Hypertension in his mother; Stroke in his father.   REVIEW OF SYSTEMS  : All other systems reviewed and negative except where noted in the History of Present Illness.   PHYSICAL EXAM: BP 130/72   Pulse (!) 59   Ht '5\' 6"'$  (1.676 m)   Wt 169 lb (76.7 kg)   BMI 27.28 kg/m  General:   Pleasant, well developed male in no acute distress Head:   Normocephalic and atraumatic. Eyes:  sclerae anicteric,conjunctive pink  Heart:   regular rate and rhythm Pulm:  Clear anteriorly; no wheezing Abdomen:   Soft, Flat AB, Active bowel sounds. mild tenderness in the epigastrium. Without guarding and Without rebound, No organomegaly appreciated. Rectal: Not evaluated Extremities:  Without edema. Msk: Symmetrical without gross deformities. Peripheral pulses intact.  Neurologic:  Alert and  oriented x4;  No focal deficits.  Skin:   Dry and intact without significant lesions or rashes. Psychiatric:  Cooperative. Normal mood and affect.   RELEVANT LABS AND IMAGING: CBC Labs (Brief)          Component Value Date/Time    WBC 3.3 (L) 07/13/2022 1555    RBC 4.86 07/13/2022 1555    HGB 13.5 07/13/2022 1555    HGB 13.7 02/23/2018 1203    HCT 42.0 07/13/2022 1555    HCT 42.0 02/23/2018 1203    PLT 265.0 07/13/2022 1555    PLT 310 02/23/2018 1203    MCV 86.5 07/13/2022 1555    MCV 85 02/23/2018 1203    MCH 27.9 02/18/2022 0658    MCHC 32.1 07/13/2022 1555    RDW  13.7 07/13/2022 1555    RDW 13.8 02/23/2018 1203    LYMPHSABS 0.5 (L) 04/21/2020 0915    LYMPHSABS 1.5 02/23/2018 1203    MONOABS 0.1 04/21/2020 0915    EOSABS 0.0 04/21/2020 0915    EOSABS 0.0 02/23/2018 1203    BASOSABS 0.0 04/21/2020 0915    BASOSABS 0.0 02/23/2018 1203        CMP     Labs (Brief)          Component Value Date/Time    NA 140 07/13/2022 1555    NA 140 02/23/2018 1203    K 4.3 07/13/2022 1555    CL 102 07/13/2022 1555    CO2 35 (H) 07/13/2022 1555    GLUCOSE 85 07/13/2022 1555    BUN 9 07/13/2022 1555    BUN 10 02/23/2018 1203    CREATININE 1.03 07/13/2022 1555  CALCIUM 9.2 07/13/2022 1555    PROT 7.0 07/13/2022 1555    PROT 6.7 02/23/2018 1203    ALBUMIN 4.1 07/13/2022 1555    ALBUMIN 4.2 02/23/2018 1203    AST 12 07/13/2022 1555    ALT 12 07/13/2022 1555    ALKPHOS 71 07/13/2022 1555    BILITOT 0.8 07/13/2022 1555    BILITOT 0.4 02/23/2018 1203    GFRNONAA >60 02/18/2022 0658    GFRAA >60 04/21/2020 0915          Vladimir Crofts, PA-C 2:35 PM  No interval change.  Not doing better on twice daily PPI.  Pain sounds atypical.  Occurs at rest.  Occurs with or without meals.  No dysphagia.  Now for EGD

## 2022-09-22 ENCOUNTER — Telehealth: Payer: Self-pay | Admitting: *Deleted

## 2022-09-22 NOTE — Telephone Encounter (Signed)
  Follow up Call-     09/21/2022    9:27 AM  Call back number  Post procedure Call Back phone  # [phone]  Permission to leave phone message No     Patient questions:  Do you have a fever, pain , or abdominal swelling? No. Pain Score  0 *  Have you tolerated food without any problems? Yes.    Have you been able to return to your normal activities? Yes.    Do you have any questions about your discharge instructions: Diet   No. Medications  No. Follow up visit  No.  Do you have questions or concerns about your Care? No.  Actions: * If pain score is 4 or above: No action needed, pain <4.

## 2022-09-28 ENCOUNTER — Other Ambulatory Visit: Payer: Self-pay | Admitting: Physician Assistant

## 2022-09-28 DIAGNOSIS — K219 Gastro-esophageal reflux disease without esophagitis: Secondary | ICD-10-CM

## 2022-10-29 NOTE — Telephone Encounter (Signed)
Error

## 2022-11-23 ENCOUNTER — Emergency Department (HOSPITAL_COMMUNITY): Payer: 59

## 2022-11-23 ENCOUNTER — Emergency Department (HOSPITAL_COMMUNITY)
Admission: EM | Admit: 2022-11-23 | Discharge: 2022-11-23 | Disposition: A | Discharge status: Home / Self Care | Payer: 59 | Attending: Emergency Medicine | Admitting: Emergency Medicine

## 2022-11-23 DIAGNOSIS — R1013 Epigastric pain: Secondary | ICD-10-CM | POA: Diagnosis not present

## 2022-11-23 DIAGNOSIS — R1114 Bilious vomiting: Secondary | ICD-10-CM | POA: Insufficient documentation

## 2022-11-23 DIAGNOSIS — R079 Chest pain, unspecified: Secondary | ICD-10-CM | POA: Diagnosis not present

## 2022-11-23 LAB — COMPREHENSIVE METABOLIC PANEL
ALT: 16 U/L (ref 0–44)
AST: 20 U/L (ref 15–41)
Albumin: 3.9 g/dL (ref 3.5–5.0)
Alkaline Phosphatase: 80 U/L (ref 38–126)
Anion gap: 16 — ABNORMAL HIGH (ref 5–15)
BUN: 8 mg/dL (ref 6–20)
CO2: 22 mmol/L (ref 22–32)
Calcium: 9 mg/dL (ref 8.9–10.3)
Chloride: 99 mmol/L (ref 98–111)
Creatinine, Ser: 1.07 mg/dL (ref 0.61–1.24)
GFR, Estimated: >60 mL/min (ref >60)
Glucose, Bld: 98 mg/dL (ref 70–99)
Potassium: 3.7 mmol/L (ref 3.5–5.1)
Sodium: 137 mmol/L (ref 135–145)
Total Bilirubin: 0.8 mg/dL (ref 0.3–1.2)
Total Protein: 7.5 g/dL (ref 6.5–8.1)

## 2022-11-23 LAB — CBC WITH DIFFERENTIAL/PLATELET
Abs Immature Granulocytes: 0.01 10*3/uL (ref 0.00–0.07)
Basophils Absolute: 0 10*3/uL (ref 0.0–0.1)
Basophils Relative: 0 %
Eosinophils Absolute: 0 10*3/uL (ref 0.0–0.5)
Eosinophils Relative: 0 %
HCT: 44.9 % (ref 39.0–52.0)
Hemoglobin: 14.4 g/dL (ref 13.0–17.0)
Immature Granulocytes: 0 %
Lymphocytes Relative: 23 %
Lymphs Abs: 1.4 10*3/uL (ref 0.7–4.0)
MCH: 27.7 pg (ref 26.0–34.0)
MCHC: 32.1 g/dL (ref 30.0–36.0)
MCV: 86.5 fL (ref 80.0–100.0)
Monocytes Absolute: 0.3 10*3/uL (ref 0.1–1.0)
Monocytes Relative: 5 %
Neutro Abs: 4.4 10*3/uL (ref 1.7–7.7)
Neutrophils Relative %: 72 %
Platelets: 288 10*3/uL (ref 150–400)
RBC: 5.19 MIL/uL (ref 4.22–5.81)
RDW: 12.6 % (ref 11.5–15.5)
WBC: 6 10*3/uL (ref 4.0–10.5)
nRBC: 0 % (ref 0.0–0.2)

## 2022-11-23 LAB — LIPASE, BLOOD: Lipase: 49 U/L (ref 11–51)

## 2022-11-23 LAB — TROPONIN I (HIGH SENSITIVITY)
Troponin I (High Sensitivity): 2 ng/L (ref <18)
Troponin I (High Sensitivity): 3 ng/L (ref <18)

## 2022-11-23 MED ORDER — LIDOCAINE VISCOUS HCL 2 % MT SOLN
15.0000 mL | Freq: Once | OROMUCOSAL | Status: AC
  Administered 2022-11-23: 15 mL via ORAL
  Filled 2022-11-23: qty 15

## 2022-11-23 MED ORDER — SUCRALFATE 1 G PO TABS: 1.0000 g | ORAL_TABLET | Freq: Three times a day (TID) | ORAL | 0 refills | Status: DC

## 2022-11-23 MED ORDER — ALUM & MAG HYDROXIDE-SIMETH 200-200-20 MG/5ML PO SUSP
30.0000 mL | Freq: Once | ORAL | Status: AC
  Administered 2022-11-23: 30 mL via ORAL
  Filled 2022-11-23: qty 30

## 2022-11-23 MED ORDER — ONDANSETRON 4 MG PO TBDP
4.0000 mg | ORAL_TABLET | Freq: Once | ORAL | Status: AC
  Administered 2022-11-23: 4 mg via ORAL
  Filled 2022-11-23: qty 1

## 2022-11-23 MED ORDER — ONDANSETRON 4 MG PO TBDP: 4.0000 mg | ORAL_TABLET | Freq: Three times a day (TID) | ORAL | 0 refills | Status: DC | PRN

## 2022-11-23 MED ORDER — HYDROMORPHONE HCL 1 MG/ML IJ SOLN
0.5000 mg | Freq: Once | INTRAMUSCULAR | Status: AC
  Administered 2022-11-23: 0.5 mg via INTRAVENOUS
  Filled 2022-11-23: qty 1

## 2022-11-23 NOTE — Discharge Instructions (Signed)
You are seen in the emergency department for chest pain and epigastric pain.  Thankfully your labs and imaging were largely reassuring without any acute findings noted.  Unfortunately this does mean that we are not sure what exactly is causing your pain and discomfort.  I would recommend they should follow-up with her primary care provider and her gastroenterologist for further evaluation as this could be related to something with the stomach specifically.  I have sent in 2 prescriptions for you to take including sucralfate and Zofran.  You should continue to take your pantoprazole as prescribed once daily.

## 2022-11-23 NOTE — ED Provider Notes (Signed)
[facility] Provider Note   CSN: [id number] Arrival date & time: 11/23/22  1215     History Chief Complaint  Patient presents with   Chest Pain    Jared Park. is a 57 y.o. male.  Patient with past medical history significant for GERD presents to the emergency department complaining of chest pain.  Patient reports that chest pain began today while he was at work and his coworkers were concerned to 911 was called and patient was brought in by EMS.  He reports he has been followed up with gastroenterology multiple times for evaluation even had EGD done which did not show any acute findings to account for his pain that he has been experiencing.  Patient denies any significant blood in his vomit or stool.  Not currently on blood thinners.   Chest Pain      Home Medications Prior to Admission medications   Medication Sig Start Date End Date Taking? Authorizing Provider  ondansetron (ZOFRAN-ODT) 4 MG disintegrating tablet Take 1 tablet (4 mg total) by mouth every 8 (eight) hours as needed for nausea or vomiting. 11/23/22  Yes Falynn Ailey A, PA-C  sucralfate (CARAFATE) 1 g tablet Take 1 tablet (1 g total) by mouth 4 (four) times daily -  with meals and at bedtime. 11/23/22  Yes Luvenia Heller, PA-C  atorvastatin (LIPITOR) 20 MG tablet Take 1 tablet (20 mg total) by mouth daily. 08/12/22   [name], MD  pantoprazole (PROTONIX) 40 MG tablet TAKE 1 TABLET(40 MG) BY MOUTH TWICE DAILY BEFORE A MEAL 09/28/22   Vicie Mutters R, PA-C  tadalafil (CIALIS) 20 MG tablet Take 0.5-1 tablets (10-20 mg total) by mouth every other day as needed for erectile dysfunction. 07/15/22   [name], MD      Allergies    Patient has no known allergies.    Review of Systems   Review of Systems  Cardiovascular:  Positive for chest pain.  All other systems reviewed and are negative.   Physical Exam Updated Vital Signs BP 137/85    Pulse (!) 52   Temp 97.9 F (36.6 C) (Oral)   Resp 15   Ht 5\' 6"  (1.676 m)   Wt 76.7 kg   SpO2 97%   BMI 27.29 kg/m  Physical Exam Vitals and nursing note reviewed.  Constitutional:      General: He is not in acute distress.    Appearance: He is well-developed.  HENT:     Head: Normocephalic and atraumatic.  Eyes:     Conjunctiva/sclera: Conjunctivae normal.  Cardiovascular:     Rate and Rhythm: Normal rate and regular rhythm.     Heart sounds: No murmur heard. Pulmonary:     Effort: Pulmonary effort is normal. No respiratory distress.     Breath sounds: Normal breath sounds.  Abdominal:     General: Bowel sounds are normal.     Palpations: Abdomen is soft.     Tenderness: There is abdominal tenderness.     Comments: TTP in epigastric region  Musculoskeletal:        General: No swelling.     Cervical back: Neck supple.  Skin:    General: Skin is warm and dry.     Capillary Refill: Capillary refill takes less than 2 seconds.  Neurological:     Mental Status: He is alert.  Psychiatric:        Mood and Affect: Mood normal.  ED Results / Procedures / Treatments   Labs (all labs ordered are listed, but only abnormal results are displayed) Labs Reviewed  COMPREHENSIVE METABOLIC PANEL - Abnormal; Notable for the following components:      Result Value   Anion gap 16 (*)    All other components within normal limits  CBC WITH DIFFERENTIAL/PLATELET  LIPASE, BLOOD  TROPONIN I (HIGH SENSITIVITY)  TROPONIN I (HIGH SENSITIVITY)    EKG EKG Interpretation  Date/Time:  Monday November 23 2022 12:26:57 EDT Ventricular Rate:  53 PR Interval:  192 QRS Duration: 104 QT Interval:  390 QTC Calculation: 367 R Axis:   87 Text Interpretation: Sinus rhythm No significant change since prior 6/23 Confirmed by Aletta Edouard [phone] on 11/23/2022 12:28:19 PM  Radiology CT ABDOMEN PELVIS WO CONTRAST  Result Date: 11/23/2022 CLINICAL DATA:  Epigastric abdominal pain EXAM: CT  ABDOMEN AND PELVIS WITHOUT CONTRAST TECHNIQUE: Multidetector CT imaging of the abdomen and pelvis was performed following the standard protocol without IV contrast. RADIATION DOSE REDUCTION: This exam was performed according to the departmental dose-optimization program which includes automated exposure control, adjustment of the mA and/or kV according to patient size and/or use of iterative reconstruction technique. COMPARISON:  None Available. FINDINGS: Lower chest: No acute abnormality. Hepatobiliary: No solid liver abnormality is seen. No gallstones, gallbladder wall thickening, or biliary dilatation. Pancreas: Unremarkable. No pancreatic ductal dilatation or surrounding inflammatory changes. Spleen: Normal in size without significant abnormality. Adrenals/Urinary Tract: Adrenal glands are unremarkable. Kidneys are normal, without renal calculi, solid lesion, or hydronephrosis. Bladder is unremarkable. Stomach/Bowel: Stomach is within normal limits. Appendix appears normal. No evidence of bowel wall thickening, distention, or inflammatory changes. Vascular/Lymphatic: No significant vascular findings are present. No enlarged abdominal or pelvic lymph nodes. Reproductive: No mass or other significant abnormality. Other: No abdominal wall hernia or abnormality. No ascites. Musculoskeletal: No acute or significant osseous findings. IMPRESSION: No acute noncontrast CT findings of the abdomen or pelvis to explain epigastric pain. Electronically Signed   By: Delanna Ahmadi M.D.   On: 11/23/2022 15:26   DG Chest 2 View  Result Date: 11/23/2022 CLINICAL DATA:  Chest pain EXAM: CHEST - 2 VIEW COMPARISON:  02/18/2022 FINDINGS: Transverse diameter of heart is slightly increased. There are no signs of pulmonary edema or focal pulmonary consolidation. There is no pleural effusion or pneumothorax. IMPRESSION: There are no signs of pulmonary edema or focal pulmonary consolidation. Electronically Signed   By: Elmer Picker M.D.   On: 11/23/2022 13:09    Procedures Procedures   Medications Ordered in ED Medications  alum & mag hydroxide-simeth (MAALOX/MYLANTA) 200-200-20 MG/5ML suspension 30 mL (30 mLs Oral Given 11/23/22 1311)    And  lidocaine (XYLOCAINE) 2 % viscous mouth solution 15 mL (15 mLs Oral Given 11/23/22 1311)  ondansetron (ZOFRAN-ODT) disintegrating tablet 4 mg (4 mg Oral Given 11/23/22 1311)  HYDROmorphone (DILAUDID) injection 0.5 mg (0.5 mg Intravenous Given 11/23/22 1530)    ED Course/ Medical Decision Making/ A&P                           Medical Decision Making Amount and/or Complexity of Data Reviewed Labs: ordered. Radiology: ordered.  Risk OTC drugs. Prescription drug management.   This patient presents to the ED for concern of chest pain. Differential diagnosis includes MI, ACS, gastric ulcer, acid reflux, cholecystitis   Lab Tests:  I Ordered, and personally interpreted labs.  The pertinent results include: Normal CBC, normal CMP,  negative troponin, normal lipase   Imaging Studies ordered:  I ordered imaging studies including chest x-ray, CT abdomen I independently visualized and interpreted imaging which showed no acute cardiopulmonary disease,  I agree with the radiologist interpretation   Medicines ordered and prescription drug management:  I ordered medication including Zofran, Maalox, lidocaine, Dilaudid for epigastric pain Reevaluation of the patient after these medicines showed that the patient improved I have reviewed the patients home medicines and have made adjustments as needed   Problem List / ED Course:  Patient presents to the ED with complaints of chest pain. He states that he was brought in by EMS after coworkers were concerned about the amount of discomfort he was in reporting that it was coming from his chest/epigastric area. Patient has been worked up before with gastroenterology and has had EGD performed with no acute abnormalities  noted at that time. Labs negative for evidence of cardiac cause to pain including normal ECG. CT abdomen performed which was negative for any evidence of cholecystitis, pancreatitis, or appendicitis or other abdominal issues. Informed patient of these results and advised patient that outpatient family medicine and GI follow up likely needed for further evaluation given some relief with GI cocktail but still in notable pain.  Unfortunately there is no clear etiology being able to be found today for patient's pain but advised that this may be also possibly a gastroenteritis as patient was having episodes of vomiting prior to arriving to the emergency department today as I do not believe that was cardiac related.  Patient agreeable with treatment plan and verbalized understanding all return precautions.   Final Clinical Impression(s) / ED Diagnoses Final diagnoses:  Epigastric pain  Bilious vomiting with nausea    Rx / DC Orders ED Discharge Orders          Ordered    sucralfate (CARAFATE) 1 g tablet  3 times daily with meals & bedtime        11/23/22 1609    ondansetron (ZOFRAN-ODT) 4 MG disintegrating tablet  Every 8 hours PRN        11/23/22 1609              Vladimir Creeks 11/23/22 1939    Hayden Rasmussen, MD 11/24/22 1734

## 2022-11-23 NOTE — ED Notes (Signed)
Pt transported to XRay 

## 2022-11-23 NOTE — ED Triage Notes (Addendum)
Pt BIB EMS from work for CP in epigastric region 10/10 pain, says he has a hx of acid reflux and states it "kind of feels like that" but coworkers were concerned that he looked like he was in a lot of pain and not looking like himself. Had 2 episodes of vomiting this morning, pts family says has hx of gallstones. SOB on exertion. Aox4.

## 2022-11-24 ENCOUNTER — Telehealth: Payer: Self-pay

## 2022-11-24 NOTE — Transitions of Care (Post Inpatient/ED Visit) (Signed)
   11/24/2022  Name: Jared Park. MRN: [id number] DOB: 08-18-1966  Today's TOC FU Call Status:    Transition Care Management Follow-up Telephone Call Date of Discharge: 11/23/22 Discharge Facility: [facility]) Type of Discharge: Emergency Department How have you been since you were released from the hospital?: Better Any questions or concerns?: Yes Patient Questions/Concerns:: Pt having "episodes" of pain and burning in his chest from reflux, accompanied by nausea and purfuse, violent vomitting. Pt and wife have called serval GI offices for an appt, however they are all scheduling so far out. Pt wife does not wat to schedule an OV, doesn't feel there is much PC can do and that pt needs to see speialist.  Items Reviewed: Medications obtained and verified?: Yes (Medications Reviewed) Any new allergies since your discharge?: No Dietary orders reviewed?: Yes Do you have support at home?: Yes People in Home: spouse  Home Care and Equipment/Supplies: [place] Ordered?: NA Any new equipment or medical supplies ordered?: NA  Functional Questionnaire: Do you need assistance with bathing/showering or dressing?: No Do you need assistance with meal preparation?: No Do you need assistance with eating?: No Do you have difficulty maintaining continence: No Do you need assistance with getting out of bed/getting out of a chair/moving?: No Do you have difficulty managing or taking your medications?: No  Follow up appointments reviewed: PCP Follow-up appointment confirmed?: No MD Provider Line Number:[phone] Given: No Specialist Hospital Follow-up appointment confirmed?: NA Do you need transportation to your follow-up appointment?: No Do you understand care options if your condition(s) worsen?: Yes-patient verbalized understanding    SIGNATURE Angeline Slim, BSN, RN

## 2022-12-01 ENCOUNTER — Ambulatory Visit: Payer: 59 | Admitting: Orthopaedic Surgery

## 2022-12-01 ENCOUNTER — Encounter: Payer: Self-pay | Admitting: Orthopaedic Surgery

## 2022-12-01 DIAGNOSIS — M7711 Lateral epicondylitis, right elbow: Secondary | ICD-10-CM

## 2022-12-01 MED ORDER — BUPIVACAINE HCL 0.5 % IJ SOLN
1.0000 mL | INTRAMUSCULAR | Status: AC | PRN
  Administered 2022-12-01: 1 mL via INTRA_ARTICULAR

## 2022-12-01 MED ORDER — LIDOCAINE HCL 1 % IJ SOLN
1.0000 mL | INTRAMUSCULAR | Status: AC | PRN
  Administered 2022-12-01: 1 mL

## 2022-12-01 MED ORDER — METHYLPREDNISOLONE ACETATE 40 MG/ML IJ SUSP
40.0000 mg | INTRAMUSCULAR | Status: AC | PRN
  Administered 2022-12-01: 40 mg via INTRA_ARTICULAR

## 2022-12-01 NOTE — Progress Notes (Signed)
Office Visit Note   Patient: Jared Park.           Date of Birth: 1966-02-02           MRN: [id number] Visit Date: 12/01/2022              Requested by: [name], MD [address] [place],  [place] [address] PCP: [name], MD   Assessment & Plan: Visit Diagnoses:  1. Lateral epicondylitis of right elbow     Plan: Impression is recurrent right lateral epicondylitis.  Based on treatment options he would like to try another injection plus activity modification.  Continue counterforce strap.  Follow-up as needed.  Follow-Up Instructions: No follow-ups on file.   Orders:  No orders of the defined types were placed in this encounter.  No orders of the defined types were placed in this encounter.     Procedures: Medium Joint Inj: L lateral epicondyle on 12/01/2022 1:15 PM Indications: pain Details: 25 G needle Medications: 1 mL lidocaine 1 %; 1 mL bupivacaine 0.5 %; 40 mg methylPREDNISolone acetate 40 MG/ML Outcome: tolerated well, no immediate complications Patient was prepped and draped in the usual sterile fashion.       Clinical Data: No additional findings.   Subjective: Chief Complaint  Patient presents with   Right Elbow - Pain    HPI  Mr. Dargenio returns today for recurrent right elbow pain from lateral epicondylitis.  Previous injection helped for about 3 months.  He works as a tow Administrator.  Review of Systems   Objective: Vital Signs: There were no vitals taken for this visit.  Physical Exam  Ortho Exam  Exam unchanged of the right elbow.  Specialty Comments:  No specialty comments available.  Imaging: No results found.   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   Cholelithiasis 08/05/2021   Left flank pain 07/22/2021   Acute medial meniscus tear, right, initial  encounter 02/19/2021   Synovitis of knee 02/19/2021   Erectile dysfunction 10/25/2020   Elevated LFTs 06/11/2020   DOE (dyspnea on exertion) 06/11/2020   Elevated LDL cholesterol level 03/12/2020   Cervical strain 03/12/2020   COMMON MIGRAINE 05/17/2007   Past Medical History:  Diagnosis Date   COVID 03/2020   MMT (medial meniscus tear)    right    Family History  Problem Relation Age of Onset   Diabetes Mother    Hyperlipidemia Mother    Hypertension Mother    Stroke Father    Healthy Sister    Healthy Brother    Colon polyps Neg Hx    Colon cancer Neg Hx    Esophageal cancer Neg Hx    Rectal cancer Neg Hx    Stomach cancer Neg Hx     Past Surgical History:  Procedure Laterality Date   KNEE ARTHROSCOPY WITH MEDIAL MENISECTOMY Right 02/19/2021   Procedure: RIGHT KNEE ARTHROSCOPY WITH PARTIAL MEDIAL MENISECTOMY;  Surgeon: Leandrew Koyanagi, MD;  Location: [facility];  Service: Orthopedics;  Laterality: Right;   WISDOM TOOTH EXTRACTION     age 60   Social History   Occupational History   Not on file  Tobacco Use   Smoking status: Never   Smokeless tobacco: Never  Vaping Use   Vaping Use: Never used  Substance and Sexual Activity   Alcohol use: Not Currently  Comment: none since 03-2020   Drug use: No   Sexual activity: Yes

## 2022-12-03 ENCOUNTER — Telehealth: Payer: Self-pay | Admitting: Orthopaedic Surgery

## 2022-12-03 MED ORDER — PREDNISONE 10 MG (21) PO TBPK: ORAL_TABLET | ORAL | 3 refills | Status: DC

## 2022-12-03 MED ORDER — TRAMADOL HCL 50 MG PO TABS: 50.0000 mg | ORAL_TABLET | Freq: Every day | ORAL | 0 refills | Status: DC | PRN

## 2022-12-03 NOTE — Telephone Encounter (Signed)
It's too soon to do anything about it right now.  Give it 2 weeks.

## 2022-12-03 NOTE — Telephone Encounter (Signed)
Notified patient.

## 2022-12-03 NOTE — Telephone Encounter (Signed)
Pt called stating his right elbow has no relief from cortisone injection. Pt states pains are worse. Please call pt at [phone].

## 2022-12-03 NOTE — Telephone Encounter (Signed)
Ok I'll send in a steroid pack and tramadol

## 2022-12-03 NOTE — Telephone Encounter (Signed)
Called and relayed information to patient. He said that he is experiencing more pain since the injection and has not been able to return to work. He states that he did not experience this with the last cortisone injection he had. Please advise. He said that he cannot handle this pain for the next 2 weeks.

## 2022-12-04 ENCOUNTER — Ambulatory Visit: Payer: 59 | Admitting: Orthopaedic Surgery

## 2022-12-13 ENCOUNTER — Emergency Department (HOSPITAL_COMMUNITY): Payer: 59

## 2022-12-13 ENCOUNTER — Encounter (HOSPITAL_COMMUNITY): Payer: Self-pay | Admitting: *Deleted

## 2022-12-13 ENCOUNTER — Other Ambulatory Visit: Payer: Self-pay

## 2022-12-13 ENCOUNTER — Observation Stay (HOSPITAL_COMMUNITY)
Admission: EM | Admit: 2022-12-13 | Discharge: 2022-12-15 | Disposition: A | Discharge status: Home / Self Care | Payer: 59 | Attending: Internal Medicine | Admitting: Internal Medicine

## 2022-12-13 DIAGNOSIS — K8010 Calculus of gallbladder with chronic cholecystitis without obstruction: Principal | ICD-10-CM | POA: Insufficient documentation

## 2022-12-13 DIAGNOSIS — R509 Fever, unspecified: Secondary | ICD-10-CM

## 2022-12-13 DIAGNOSIS — Z79899 Other long term (current) drug therapy: Secondary | ICD-10-CM | POA: Diagnosis not present

## 2022-12-13 DIAGNOSIS — R55 Syncope and collapse: Secondary | ICD-10-CM | POA: Diagnosis not present

## 2022-12-13 DIAGNOSIS — E876 Hypokalemia: Secondary | ICD-10-CM | POA: Diagnosis present

## 2022-12-13 DIAGNOSIS — R1013 Epigastric pain: Secondary | ICD-10-CM | POA: Diagnosis present

## 2022-12-13 DIAGNOSIS — K219 Gastro-esophageal reflux disease without esophagitis: Secondary | ICD-10-CM | POA: Diagnosis present

## 2022-12-13 DIAGNOSIS — K819 Cholecystitis, unspecified: Secondary | ICD-10-CM

## 2022-12-13 DIAGNOSIS — Z1152 Encounter for screening for COVID-19: Secondary | ICD-10-CM | POA: Insufficient documentation

## 2022-12-13 DIAGNOSIS — R519 Headache, unspecified: Secondary | ICD-10-CM | POA: Insufficient documentation

## 2022-12-13 DIAGNOSIS — Z8616 Personal history of COVID-19: Secondary | ICD-10-CM | POA: Diagnosis not present

## 2022-12-13 DIAGNOSIS — R17 Unspecified jaundice: Secondary | ICD-10-CM

## 2022-12-13 DIAGNOSIS — R112 Nausea with vomiting, unspecified: Secondary | ICD-10-CM | POA: Diagnosis not present

## 2022-12-13 HISTORY — DX: Gastro-esophageal reflux disease without esophagitis: K21.9

## 2022-12-13 HISTORY — DX: Nausea with vomiting, unspecified: R11.2

## 2022-12-13 LAB — COMPREHENSIVE METABOLIC PANEL
ALT: 16 U/L (ref 0–44)
AST: 13 U/L — ABNORMAL LOW (ref 15–41)
Albumin: 3.4 g/dL — ABNORMAL LOW (ref 3.5–5.0)
Alkaline Phosphatase: 76 U/L (ref 38–126)
Anion gap: 12 (ref 5–15)
BUN: 7 mg/dL (ref 6–20)
CO2: 29 mmol/L (ref 22–32)
Calcium: 8.8 mg/dL — ABNORMAL LOW (ref 8.9–10.3)
Chloride: 96 mmol/L — ABNORMAL LOW (ref 98–111)
Creatinine, Ser: 1.09 mg/dL (ref 0.61–1.24)
GFR, Estimated: >60 mL/min (ref >60)
Glucose, Bld: 99 mg/dL (ref 70–99)
Potassium: 3.1 mmol/L — ABNORMAL LOW (ref 3.5–5.1)
Sodium: 137 mmol/L (ref 135–145)
Total Bilirubin: 1.5 mg/dL — ABNORMAL HIGH (ref 0.3–1.2)
Total Protein: 7 g/dL (ref 6.5–8.1)

## 2022-12-13 LAB — CBC WITH DIFFERENTIAL/PLATELET
Abs Immature Granulocytes: 0.02 10*3/uL (ref 0.00–0.07)
Basophils Absolute: 0 10*3/uL (ref 0.0–0.1)
Basophils Relative: 0 %
Eosinophils Absolute: 0 10*3/uL (ref 0.0–0.5)
Eosinophils Relative: 1 %
HCT: 46.1 % (ref 39.0–52.0)
Hemoglobin: 14.8 g/dL (ref 13.0–17.0)
Immature Granulocytes: 0 %
Lymphocytes Relative: 12 %
Lymphs Abs: 0.8 10*3/uL (ref 0.7–4.0)
MCH: 27.9 pg (ref 26.0–34.0)
MCHC: 32.1 g/dL (ref 30.0–36.0)
MCV: 87 fL (ref 80.0–100.0)
Monocytes Absolute: 0.6 10*3/uL (ref 0.1–1.0)
Monocytes Relative: 9 %
Neutro Abs: 5.2 10*3/uL (ref 1.7–7.7)
Neutrophils Relative %: 78 %
Platelets: 319 10*3/uL (ref 150–400)
RBC: 5.3 MIL/uL (ref 4.22–5.81)
RDW: 13.5 % (ref 11.5–15.5)
WBC: 6.7 10*3/uL (ref 4.0–10.5)
nRBC: 0 % (ref 0.0–0.2)

## 2022-12-13 LAB — LIPASE, BLOOD: Lipase: 48 U/L (ref 11–51)

## 2022-12-13 LAB — LACTIC ACID, PLASMA
Lactic Acid, Venous: 1.3 mmol/L (ref 0.5–1.9)
Lactic Acid, Venous: 1.7 mmol/L (ref 0.5–1.9)

## 2022-12-13 LAB — TROPONIN I (HIGH SENSITIVITY)
Troponin I (High Sensitivity): 3 ng/L (ref <18)
Troponin I (High Sensitivity): 3 ng/L (ref <18)

## 2022-12-13 LAB — RESP PANEL BY RT-PCR (RSV, FLU A&B, COVID)  RVPGX2
Influenza A by PCR: NEGATIVE
Influenza B by PCR: NEGATIVE
Resp Syncytial Virus by PCR: NEGATIVE
SARS Coronavirus 2 by RT PCR: NEGATIVE

## 2022-12-13 MED ORDER — SODIUM CHLORIDE 0.9% FLUSH
3.0000 mL | Freq: Two times a day (BID) | INTRAVENOUS | Status: DC
  Administered 2022-12-13 – 2022-12-14 (×3): 3 mL via INTRAVENOUS

## 2022-12-13 MED ORDER — HYDROCODONE-ACETAMINOPHEN 5-325 MG PO TABS: 1.0000 | ORAL_TABLET | Freq: Four times a day (QID) | ORAL | Status: DC | PRN

## 2022-12-13 MED ORDER — ALBUTEROL SULFATE (2.5 MG/3ML) 0.083% IN NEBU: 2.5000 mg | INHALATION_SOLUTION | Freq: Four times a day (QID) | RESPIRATORY_TRACT | Status: DC | PRN

## 2022-12-13 MED ORDER — ONDANSETRON HCL 4 MG PO TABS: 4.0000 mg | ORAL_TABLET | Freq: Four times a day (QID) | ORAL | Status: DC | PRN

## 2022-12-13 MED ORDER — ACETAMINOPHEN 325 MG PO TABS: 650.0000 mg | ORAL_TABLET | Freq: Four times a day (QID) | ORAL | Status: DC | PRN

## 2022-12-13 MED ORDER — PANTOPRAZOLE SODIUM 40 MG IV SOLR
40.0000 mg | Freq: Two times a day (BID) | INTRAVENOUS | Status: DC
  Administered 2022-12-13 – 2022-12-14 (×3): 40 mg via INTRAVENOUS
  Filled 2022-12-13 (×3): qty 10

## 2022-12-13 MED ORDER — POTASSIUM CHLORIDE CRYS ER 20 MEQ PO TBCR
40.0000 meq | EXTENDED_RELEASE_TABLET | Freq: Once | ORAL | Status: AC
  Administered 2022-12-13: 40 meq via ORAL
  Filled 2022-12-13: qty 2

## 2022-12-13 MED ORDER — ONDANSETRON HCL 4 MG/2ML IJ SOLN: 4.0000 mg | Freq: Four times a day (QID) | INTRAMUSCULAR | Status: DC | PRN

## 2022-12-13 MED ORDER — ACETAMINOPHEN 650 MG RE SUPP: 650.0000 mg | Freq: Four times a day (QID) | RECTAL | Status: DC | PRN

## 2022-12-13 MED ORDER — SODIUM CHLORIDE 0.9 % IV SOLN
2.0000 g | INTRAVENOUS | Status: DC
  Administered 2022-12-13 – 2022-12-14 (×2): 2 g via INTRAVENOUS
  Filled 2022-12-13 (×2): qty 20

## 2022-12-13 MED ORDER — ENOXAPARIN SODIUM 40 MG/0.4ML IJ SOSY
40.0000 mg | PREFILLED_SYRINGE | INTRAMUSCULAR | Status: DC
  Administered 2022-12-13 – 2022-12-14 (×2): 40 mg via SUBCUTANEOUS
  Filled 2022-12-13 (×2): qty 0.4

## 2022-12-13 MED ORDER — SODIUM CHLORIDE 0.9 % IV SOLN: INTRAVENOUS | Status: DC

## 2022-12-13 MED ORDER — ACETAMINOPHEN 325 MG PO TABS
650.0000 mg | ORAL_TABLET | Freq: Once | ORAL | Status: AC
  Administered 2022-12-13: 650 mg via ORAL
  Filled 2022-12-13: qty 2

## 2022-12-13 NOTE — Consult Note (Signed)
CC/reason for consult: possible cholecystitis  Requesting physician: Dr. Shelbie Proctor  HPI: Jared Park. is an 57 y.o. male with hx of GERD who presented emerged department today for evaluation of of nausea/vomiting/midepigastric pain.  Subjectively reports temperature of 101 last night.  He did take some ibuprofen.  While in the emergency room there was some documentation that he may have had a seizure.  He reports that for the last approximately 1 year he has dealt with waxing and waning midepigastric pain that has never really gone away.  He describes it as a deep cramp/achy type sensation.  Symptoms did seem to be changed by taking omeprazole plus Carafate through gastroenterology.  He had an EGD with Dr. Marina Goodell 09/21/2022 that was normal.  Colonoscopy in 2020 was notable for a few small polyps and diverticulosis.  Past Medical History:  Diagnosis Date   COVID 03/2020   GERD (gastroesophageal reflux disease)    MMT (medial meniscus tear)    right   Nausea & vomiting 12/13/2022    Past Surgical History:  Procedure Laterality Date   KNEE ARTHROSCOPY WITH MEDIAL MENISECTOMY Right 02/19/2021   Procedure: RIGHT KNEE ARTHROSCOPY WITH PARTIAL MEDIAL MENISECTOMY;  Surgeon: Tarry Kos, MD;  Location: [facility] OR;  Service: Orthopedics;  Laterality: Right;   WISDOM TOOTH EXTRACTION     age 31    Family History  Problem Relation Age of Onset   Diabetes Mother    Hyperlipidemia Mother    Hypertension Mother    Stroke Father    Healthy Sister    Healthy Brother    Colon polyps Neg Hx    Colon cancer Neg Hx    Esophageal cancer Neg Hx    Rectal cancer Neg Hx    Stomach cancer Neg Hx     Social:  reports that he has never smoked. He has never used smokeless tobacco. He reports that he does not currently use alcohol. He reports that he does not use drugs.  Allergies: No Known Allergies  Medications: I have reviewed the patient's current medications.  Results for orders  placed or performed during the hospital encounter of 12/13/22 (from the past 48 hour(s))  Lactic acid, plasma     Status: None   Collection Time: 12/13/22 11:44 AM  Result Value Ref Range   Lactic Acid, Venous 1.3 0.5 - 1.9 mmol/L    Comment: Performed at [facility], [address] [place], [place] [address]  Comprehensive metabolic panel     Status: Abnormal   Collection Time: 12/13/22 11:44 AM  Result Value Ref Range   Sodium 137 135 - 145 mmol/L   Potassium 3.1 (L) 3.5 - 5.1 mmol/L   Chloride 96 (L) 98 - 111 mmol/L   CO2 29 22 - 32 mmol/L   Glucose, Bld 99 70 - 99 mg/dL    Comment: Glucose reference range applies only to samples taken after fasting for at least 8 hours.   BUN 7 6 - 20 mg/dL   Creatinine, Ser 6.04 0.61 - 1.24 mg/dL   Calcium 8.8 (L) 8.9 - 10.3 mg/dL   Total Protein 7.0 6.5 - 8.1 g/dL   Albumin 3.4 (L) 3.5 - 5.0 g/dL   AST 13 (L) 15 - 41 U/L   ALT 16 0 - 44 U/L   Alkaline Phosphatase 76 38 - 126 U/L   Total Bilirubin 1.5 (H) 0.3 - 1.2 mg/dL   GFR, Estimated >54 >09 mL/min    Comment: (  NOTE) Calculated using the CKD-EPI Creatinine Equation (2021)    Anion gap 12 5 - 15    Comment: Performed at [facility], [address] [place], [place] [address]  CBC with Differential     Status: None   Collection Time: 12/13/22 11:44 AM  Result Value Ref Range   WBC 6.7 4.0 - 10.5 K/uL   RBC 5.30 4.22 - 5.81 MIL/uL   Hemoglobin 14.8 13.0 - 17.0 g/dL   HCT 90.2 11.1 - 55.2 %   MCV 87.0 80.0 - 100.0 fL   MCH 27.9 26.0 - 34.0 pg   MCHC 32.1 30.0 - 36.0 g/dL   RDW 08.0 22.3 - 36.1 %   Platelets 319 150 - 400 K/uL   nRBC 0.0 0.0 - 0.2 %   Neutrophils Relative % 78 %   Neutro Abs 5.2 1.7 - 7.7 K/uL   Lymphocytes Relative 12 %   Lymphs Abs 0.8 0.7 - 4.0 K/uL   Monocytes Relative 9 %   Monocytes Absolute 0.6 0.1 - 1.0 K/uL   Eosinophils Relative 1 %   Eosinophils Absolute 0.0 0.0 - 0.5 K/uL   Basophils Relative 0 %   Basophils Absolute 0.0 0.0 - 0.1  K/uL   Immature Granulocytes 0 %   Abs Immature Granulocytes 0.02 0.00 - 0.07 K/uL    Comment: Performed at [facility], [address], [place], [place] [address]  Troponin I (High Sensitivity)     Status: None   Collection Time: 12/13/22 11:44 AM  Result Value Ref Range   Troponin I (High Sensitivity) 3 <18 ng/L    Comment: (NOTE) Elevated high sensitivity troponin I (hsTnI) values and significant  changes across serial measurements may suggest ACS but many other  chronic and acute conditions are known to elevate hsTnI results.  Refer to the "Links" section for chest pain algorithms and additional  guidance. Performed at [facility], [address], [place], [place] [address]   Lactic acid, plasma     Status: None   Collection Time: 12/13/22 12:05 PM  Result Value Ref Range   Lactic Acid, Venous 1.7 0.5 - 1.9 mmol/L    Comment: Performed at [facility], [address], [place], [place] [address]  Resp panel by RT-PCR (RSV, Flu A&B, Covid) Anterior Nasal Swab     Status: None   Collection Time: 12/13/22 12:05 PM   Specimen: Anterior Nasal Swab  Result Value Ref Range   SARS Coronavirus 2 by RT PCR NEGATIVE NEGATIVE   Influenza A by PCR NEGATIVE NEGATIVE   Influenza B by PCR NEGATIVE NEGATIVE    Comment: (NOTE) The Xpert Xpress SARS-CoV-2/FLU/RSV plus assay is intended as an aid in the diagnosis of influenza from Nasopharyngeal swab specimens and should not be used as a sole basis for treatment. Nasal washings and aspirates are unacceptable for Xpert Xpress SARS-CoV-2/FLU/RSV testing.  Fact Sheet for Patients: BloggerCourse.com  Fact Sheet for Healthcare Providers: SeriousBroker.it  This test is not yet approved or cleared by the [place] FDA and has been authorized for detection and/or diagnosis of SARS-CoV-2 by FDA under an Emergency Use Authorization (EUA). This EUA will remain in effect  (meaning this test can be used) for the duration of the COVID-19 declaration under Section 564(b)(1) of the Act, 21 U.S.C. section 360bbb-3(b)(1), unless the authorization is terminated or revoked.     Resp Syncytial Virus by PCR NEGATIVE NEGATIVE    Comment: (NOTE) Fact Sheet  for Patients: BloggerCourse.com  Fact Sheet for Healthcare Providers: SeriousBroker.it  This test is not yet approved or cleared by the [place] FDA and has been authorized for detection and/or diagnosis of SARS-CoV-2 by FDA under an Emergency Use Authorization (EUA). This EUA will remain in effect (meaning this test can be used) for the duration of the COVID-19 declaration under Section 564(b)(1) of the Act, 21 U.S.C. section 360bbb-3(b)(1), unless the authorization is terminated or revoked.  Performed at [facility], [address], [place], [place] [address]     US Abdomen Limited RUQ (LIVER/GB)  Result Date: 12/13/2022 CLINICAL DATA:  Abdominal pain EXAM: ULTRASOUND ABDOMEN LIMITED RIGHT UPPER QUADRANT COMPARISON:  CT abdomen and pelvis without contrast November 23, 2022, abdominal ultrasound August 01, 2021 FINDINGS: Gallbladder: There is layering sludge within the gallbladder. No gallstones visualized. No gallbladder wall thickening or pericholecystic fluid. A positive sonographic Murphy's sign is noted by the sonographer. Common bile duct: Diameter: 5 mm Liver: No focal lesion identified. Within normal limits in parenchymal echogenicity. Portal vein is patent on color Doppler imaging with normal direction of blood flow towards the liver. Other: None. IMPRESSION: Findings are overall equivocal for acute cholecystitis. There is layering sludge in the gallbladder and a positive sonographic Murphy's sign is noted by the sonographer. However, there is no gallbladder wall thickening or pericholecystic fluid. If there is persistent clinical concern,  recommend further assessment with CT abdomen and pelvis or HIDA scan. Electronically Signed   By: Jacob Moores M.D.   On: 12/13/2022 15:14   DG Chest 2 View  Result Date: 12/13/2022 CLINICAL DATA:  Chest pain. EXAM: CHEST - 2 VIEW COMPARISON:  November 23, 2022. FINDINGS: The heart size and mediastinal contours are within normal limits. Both lungs are clear. The visualized skeletal structures are unremarkable. IMPRESSION: No active cardiopulmonary disease. Electronically Signed   By: Lupita Raider M.D.   On: 12/13/2022 12:16    ROS - all of the below systems have been reviewed with the patient and positives are indicated with bold text General: chills, fever or night sweats Eyes: blurry vision or double vision ENT: epistaxis or sore throat Allergy/Immunology: itchy/watery eyes or nasal congestion Hematologic/Lymphatic: bleeding problems, blood clots or swollen lymph nodes Endocrine: temperature intolerance or unexpected weight changes Breast: new or changing breast lumps or nipple discharge Resp: cough, shortness of breath, or wheezing CV: chest pain or dyspnea on exertion GI: as per HPI GU: dysuria, trouble voiding, or hematuria MSK: joint pain or joint stiffness Neuro: TIA or stroke symptoms Derm: pruritus and skin lesion changes Psych: anxiety and depression  PE Blood pressure 118/78, pulse 76, temperature 99 F (37.2 C), temperature source Oral, resp. rate 18, height  (1.676 m), weight 76.7 kg, SpO2 95 %. Constitutional: NAD; conversant Eyes: Moist conjunctiva; no lid lag; anicteric Lungs: Normal respiratory effort CV: RRR GI: Abd soft, minimal if any tenderness on exam, nondistended; no palpable hepatosplenomegaly. No rebound nor guarding. Psychiatric: Appropriate affect  Results for orders placed or performed during the hospital encounter of 12/13/22 (from the past 48 hour(s))  Lactic acid, plasma     Status: None   Collection Time: 12/13/22 11:44 AM  Result Value  Ref Range   Lactic Acid, Venous 1.3 0.5 - 1.9 mmol/L    Comment: Performed at [facility], [address], [place], [place] [address]  Comprehensive metabolic panel     Status: Abnormal   Collection Time: 12/13/22 11:44 AM  Result  Value Ref Range   Sodium 137 135 - 145 mmol/L   Potassium 3.1 (L) 3.5 - 5.1 mmol/L   Chloride 96 (L) 98 - 111 mmol/L   CO2 29 22 - 32 mmol/L   Glucose, Bld 99 70 - 99 mg/dL    Comment: Glucose reference range applies only to samples taken after fasting for at least 8 hours.   BUN 7 6 - 20 mg/dL   Creatinine, Ser 1.61 0.61 - 1.24 mg/dL   Calcium 8.8 (L) 8.9 - 10.3 mg/dL   Total Protein 7.0 6.5 - 8.1 g/dL   Albumin 3.4 (L) 3.5 - 5.0 g/dL   AST 13 (L) 15 - 41 U/L   ALT 16 0 - 44 U/L   Alkaline Phosphatase 76 38 - 126 U/L   Total Bilirubin 1.5 (H) 0.3 - 1.2 mg/dL   GFR, Estimated >09 >60 mL/min    Comment: (NOTE) Calculated using the CKD-EPI Creatinine Equation (2021)    Anion gap 12 5 - 15    Comment: Performed at [facility], [address] [place], [place] [address]  CBC with Differential     Status: None   Collection Time: 12/13/22 11:44 AM  Result Value Ref Range   WBC 6.7 4.0 - 10.5 K/uL   RBC 5.30 4.22 - 5.81 MIL/uL   Hemoglobin 14.8 13.0 - 17.0 g/dL   HCT 81.1 91.4 - 78.2 %   MCV 87.0 80.0 - 100.0 fL   MCH 27.9 26.0 - 34.0 pg   MCHC 32.1 30.0 - 36.0 g/dL   RDW 95.6 21.3 - 08.6 %   Platelets 319 150 - 400 K/uL   nRBC 0.0 0.0 - 0.2 %   Neutrophils Relative % 78 %   Neutro Abs 5.2 1.7 - 7.7 K/uL   Lymphocytes Relative 12 %   Lymphs Abs 0.8 0.7 - 4.0 K/uL   Monocytes Relative 9 %   Monocytes Absolute 0.6 0.1 - 1.0 K/uL   Eosinophils Relative 1 %   Eosinophils Absolute 0.0 0.0 - 0.5 K/uL   Basophils Relative 0 %   Basophils Absolute 0.0 0.0 - 0.1 K/uL   Immature Granulocytes 0 %   Abs Immature Granulocytes 0.02 0.00 - 0.07 K/uL    Comment: Performed at [facility], [address] [place], [place] [address]  Troponin  I (High Sensitivity)     Status: None   Collection Time: 12/13/22 11:44 AM  Result Value Ref Range   Troponin I (High Sensitivity) 3 <18 ng/L    Comment: (NOTE) Elevated high sensitivity troponin I (hsTnI) values and significant  changes across serial measurements may suggest ACS but many other  chronic and acute conditions are known to elevate hsTnI results.  Refer to the "Links" section for chest pain algorithms and additional  guidance. Performed at [facility], [address], [place], [place] [address]   Lactic acid, plasma     Status: None   Collection Time: 12/13/22 12:05 PM  Result Value Ref Range   Lactic Acid, Venous 1.7 0.5 - 1.9 mmol/L    Comment: Performed at [facility], [address], [place], [place] [address]  Resp panel by RT-PCR (RSV, Flu A&B, Covid) Anterior Nasal Swab     Status: None   Collection Time: 12/13/22 12:05 PM   Specimen: Anterior Nasal Swab  Result Value Ref Range   SARS Coronavirus 2 by RT PCR NEGATIVE NEGATIVE   Influenza A by PCR NEGATIVE  NEGATIVE   Influenza B by PCR NEGATIVE NEGATIVE    Comment: (NOTE) The Xpert Xpress SARS-CoV-2/FLU/RSV plus assay is intended as an aid in the diagnosis of influenza from Nasopharyngeal swab specimens and should not be used as a sole basis for treatment. Nasal washings and aspirates are unacceptable for Xpert Xpress SARS-CoV-2/FLU/RSV testing.  Fact Sheet for Patients: BloggerCourse.com  Fact Sheet for Healthcare Providers: SeriousBroker.it  This test is not yet approved or cleared by the [place] FDA and has been authorized for detection and/or diagnosis of SARS-CoV-2 by FDA under an Emergency Use Authorization (EUA). This EUA will remain in effect (meaning this test can be used) for the duration of the COVID-19 declaration under Section 564(b)(1) of the Act, 21 U.S.C. section 360bbb-3(b)(1), unless the authorization is terminated  or revoked.     Resp Syncytial Virus by PCR NEGATIVE NEGATIVE    Comment: (NOTE) Fact Sheet for Patients: BloggerCourse.com  Fact Sheet for Healthcare Providers: SeriousBroker.it  This test is not yet approved or cleared by the [place] FDA and has been authorized for detection and/or diagnosis of SARS-CoV-2 by FDA under an Emergency Use Authorization (EUA). This EUA will remain in effect (meaning this test can be used) for the duration of the COVID-19 declaration under Section 564(b)(1) of the Act, 21 U.S.C. section 360bbb-3(b)(1), unless the authorization is terminated or revoked.  Performed at [facility], [address], [place], [place] [address]     US Abdomen Limited RUQ (LIVER/GB)  Result Date: 12/13/2022 CLINICAL DATA:  Abdominal pain EXAM: ULTRASOUND ABDOMEN LIMITED RIGHT UPPER QUADRANT COMPARISON:  CT abdomen and pelvis without contrast November 23, 2022, abdominal ultrasound August 01, 2021 FINDINGS: Gallbladder: There is layering sludge within the gallbladder. No gallstones visualized. No gallbladder wall thickening or pericholecystic fluid. A positive sonographic Murphy's sign is noted by the sonographer. Common bile duct: Diameter: 5 mm Liver: No focal lesion identified. Within normal limits in parenchymal echogenicity. Portal vein is patent on color Doppler imaging with normal direction of blood flow towards the liver. Other: None. IMPRESSION: Findings are overall equivocal for acute cholecystitis. There is layering sludge in the gallbladder and a positive sonographic Murphy's sign is noted by the sonographer. However, there is no gallbladder wall thickening or pericholecystic fluid. If there is persistent clinical concern, recommend further assessment with CT abdomen and pelvis or HIDA scan. Electronically Signed   By: Jacob Moores M.D.   On: 12/13/2022 15:14   DG Chest 2 View  Result Date:  12/13/2022 CLINICAL DATA:  Chest pain. EXAM: CHEST - 2 VIEW COMPARISON:  November 23, 2022. FINDINGS: The heart size and mediastinal contours are within normal limits. Both lungs are clear. The visualized skeletal structures are unremarkable. IMPRESSION: No active cardiopulmonary disease. Electronically Signed   By: Lupita Raider M.D.   On: 12/13/2022 12:16    A/P: Jared Park. is an 57 y.o. male with hx GERD, HLD here with MEG pain, fever  -RUQ [place] was ruled as equivocal for appendicitis on the basis of no wall thickening; noted to have sludge; no frank gallstone seen.  No pericholecystic fluid.Marland Kitchen  CBD 5 mm. -Recommend CT abdomen/pelvis with contrast to further evaluate.  If no clear cause, may consider HIDA scan versus surgery -laparoscopic cholecystectomy given that he does have at least sludge notable and this could be related to transient choledocholithiasis or "chronic" cholecystitis.  He did have a mildly elevated total bilirubin at 1.5 which may also be seen in  the setting of vomiting and dehydration.  -Can cover with empiric IV antibiotics, n.p.o. after midnight, maintenance IV fluids.  -If CT shows any other concerning findings sooner, please let us know.  I spent a total of 65 minutes in both face-to-face and non-face-to-face activities, excluding procedures performed, for this visit on the date of this encounter.  Marin Olp, MD [facility], [facility]

## 2022-12-13 NOTE — ED Notes (Signed)
PT briefly lost consciousness in triage while getting EKG, throwing head back, in what appeared to be a seizure.

## 2022-12-13 NOTE — ED Triage Notes (Signed)
Pt here with epigastric pain and vomiting since Friday and fever in  the 101's since last night.  Last took ibuprofen last night.  Temp of 101.3 in triage.

## 2022-12-13 NOTE — ED Notes (Signed)
ED TO INPATIENT HANDOFF REPORT  ED Nurse Name and Phone #: Jess Barters [phone]  S Name/Age/Gender Jared Park. 57 y.o. male Room/Bed: 032C/032C  Code Status   Code Status: Full Code  Home/SNF/Other Home Patient oriented to: self, place, time, and situation Is this baseline? Yes   Triage Complete: Triage complete  Chief Complaint Syncope [R55]  Triage Note Pt here with epigastric pain and vomiting since Friday and fever in  the 101's since last night.  Last took ibuprofen last night.  Temp of 101.3 in triage.   Allergies No Known Allergies  Level of Care/Admitting Diagnosis ED Disposition     ED Disposition  Admit   Condition  --   Comment  Hospital Area: [facility] [100100]  Level of Care: Telemetry Medical [104]  May admit patient to [facility] or [facility] if equivalent level of care is available:: No  Covid Evaluation: Asymptomatic - no recent exposure (last 10 days) testing not required  Diagnosis: Syncope [206001]  Admitting Physician: Clydie Braun [[id number]]  Attending Physician: Clydie Braun [[id number]]  Certification:: I certify this patient will need inpatient services for at least 2 midnights  Estimated Length of Stay: 2          B Medical/Surgery History Past Medical History:  Diagnosis Date   COVID 03/2020   GERD (gastroesophageal reflux disease)    MMT (medial meniscus tear)    right   Past Surgical History:  Procedure Laterality Date   KNEE ARTHROSCOPY WITH MEDIAL MENISECTOMY Right 02/19/2021   Procedure: RIGHT KNEE ARTHROSCOPY WITH PARTIAL MEDIAL MENISECTOMY;  Surgeon: Tarry Kos, MD;  Location: [facility] OR;  Service: Orthopedics;  Laterality: Right;   WISDOM TOOTH EXTRACTION     age 23     A IV Location/Drains/Wounds Patient Lines/Drains/Airways Status     Active Line/Drains/Airways     Name Placement date Placement time Site Days   Peripheral IV 12/13/22 20 G Anterior;Proximal;Right Forearm  12/13/22  1201  Forearm  less than 1            Intake/Output Last 24 hours No intake or output data in the 24 hours ending 12/13/22 1547  Labs/Imaging Results for orders placed or performed during the hospital encounter of 12/13/22 (from the past 48 hour(s))  Lactic acid, plasma     Status: None   Collection Time: 12/13/22 11:44 AM  Result Value Ref Range   Lactic Acid, Venous 1.3 0.5 - 1.9 mmol/L    Comment: Performed at [facility], [address] [place], [place] [address]  Comprehensive metabolic panel     Status: Abnormal   Collection Time: 12/13/22 11:44 AM  Result Value Ref Range   Sodium 137 135 - 145 mmol/L   Potassium 3.1 (L) 3.5 - 5.1 mmol/L   Chloride 96 (L) 98 - 111 mmol/L   CO2 29 22 - 32 mmol/L   Glucose, Bld 99 70 - 99 mg/dL    Comment: Glucose reference range applies only to samples taken after fasting for at least 8 hours.   BUN 7 6 - 20 mg/dL   Creatinine, Ser 9.62 0.61 - 1.24 mg/dL   Calcium 8.8 (L) 8.9 - 10.3 mg/dL   Total Protein 7.0 6.5 - 8.1 g/dL   Albumin 3.4 (L) 3.5 - 5.0 g/dL   AST 13 (L) 15 - 41 U/L   ALT 16 0 - 44 U/L   Alkaline Phosphatase 76 38 - 126 U/L  Total Bilirubin 1.5 (H) 0.3 - 1.2 mg/dL   GFR, Estimated >27 >25 mL/min    Comment: (NOTE) Calculated using the CKD-EPI Creatinine Equation (2021)    Anion gap 12 5 - 15    Comment: Performed at [facility], [address], [place], [place] [address]  CBC with Differential     Status: None   Collection Time: 12/13/22 11:44 AM  Result Value Ref Range   WBC 6.7 4.0 - 10.5 K/uL   RBC 5.30 4.22 - 5.81 MIL/uL   Hemoglobin 14.8 13.0 - 17.0 g/dL   HCT 03.4 74.2 - 59.5 %   MCV 87.0 80.0 - 100.0 fL   MCH 27.9 26.0 - 34.0 pg   MCHC 32.1 30.0 - 36.0 g/dL   RDW 63.8 75.6 - 43.3 %   Platelets 319 150 - 400 K/uL   nRBC 0.0 0.0 - 0.2 %   Neutrophils Relative % 78 %   Neutro Abs 5.2 1.7 - 7.7 K/uL   Lymphocytes Relative 12 %   Lymphs Abs 0.8 0.7 - 4.0 K/uL   Monocytes  Relative 9 %   Monocytes Absolute 0.6 0.1 - 1.0 K/uL   Eosinophils Relative 1 %   Eosinophils Absolute 0.0 0.0 - 0.5 K/uL   Basophils Relative 0 %   Basophils Absolute 0.0 0.0 - 0.1 K/uL   Immature Granulocytes 0 %   Abs Immature Granulocytes 0.02 0.00 - 0.07 K/uL    Comment: Performed at [facility], [address] [place], [place] [address]  Troponin I (High Sensitivity)     Status: None   Collection Time: 12/13/22 11:44 AM  Result Value Ref Range   Troponin I (High Sensitivity) 3 <18 ng/L    Comment: (NOTE) Elevated high sensitivity troponin I (hsTnI) values and significant  changes across serial measurements may suggest ACS but many other  chronic and acute conditions are known to elevate hsTnI results.  Refer to the "Links" section for chest pain algorithms and additional  guidance. Performed at [facility], [address], [place], [place] [address]   Lactic acid, plasma     Status: None   Collection Time: 12/13/22 12:05 PM  Result Value Ref Range   Lactic Acid, Venous 1.7 0.5 - 1.9 mmol/L    Comment: Performed at [facility], [address], [place], [place] [address]  Resp panel by RT-PCR (RSV, Flu A&B, Covid) Anterior Nasal Swab     Status: None   Collection Time: 12/13/22 12:05 PM   Specimen: Anterior Nasal Swab  Result Value Ref Range   SARS Coronavirus 2 by RT PCR NEGATIVE NEGATIVE   Influenza A by PCR NEGATIVE NEGATIVE   Influenza B by PCR NEGATIVE NEGATIVE    Comment: (NOTE) The Xpert Xpress SARS-CoV-2/FLU/RSV plus assay is intended as an aid in the diagnosis of influenza from Nasopharyngeal swab specimens and should not be used as a sole basis for treatment. Nasal washings and aspirates are unacceptable for Xpert Xpress SARS-CoV-2/FLU/RSV testing.  Fact Sheet for Patients: BloggerCourse.com  Fact Sheet for Healthcare Providers: SeriousBroker.it  This test is not yet approved or cleared  by the [place] FDA and has been authorized for detection and/or diagnosis of SARS-CoV-2 by FDA under an Emergency Use Authorization (EUA). This EUA will remain in effect (meaning this test can be used) for the duration of the COVID-19 declaration under Section 564(b)(1) of the Act, 21 U.S.C. section 360bbb-3(b)(1), unless the authorization is terminated or  revoked.     Resp Syncytial Virus by PCR NEGATIVE NEGATIVE    Comment: (NOTE) Fact Sheet for Patients: BloggerCourse.com  Fact Sheet for Healthcare Providers: SeriousBroker.it  This test is not yet approved or cleared by the [place] FDA and has been authorized for detection and/or diagnosis of SARS-CoV-2 by FDA under an Emergency Use Authorization (EUA). This EUA will remain in effect (meaning this test can be used) for the duration of the COVID-19 declaration under Section 564(b)(1) of the Act, 21 U.S.C. section 360bbb-3(b)(1), unless the authorization is terminated or revoked.  Performed at [facility], [address], [place], [place] [address]    US Abdomen Limited RUQ (LIVER/GB)  Result Date: 12/13/2022 CLINICAL DATA:  Abdominal pain EXAM: ULTRASOUND ABDOMEN LIMITED RIGHT UPPER QUADRANT COMPARISON:  CT abdomen and pelvis without contrast November 23, 2022, abdominal ultrasound August 01, 2021 FINDINGS: Gallbladder: There is layering sludge within the gallbladder. No gallstones visualized. No gallbladder wall thickening or pericholecystic fluid. A positive sonographic Murphy's sign is noted by the sonographer. Common bile duct: Diameter: 5 mm Liver: No focal lesion identified. Within normal limits in parenchymal echogenicity. Portal vein is patent on color Doppler imaging with normal direction of blood flow towards the liver. Other: None. IMPRESSION: Findings are overall equivocal for acute cholecystitis. There is layering sludge in the gallbladder and a positive  sonographic Murphy's sign is noted by the sonographer. However, there is no gallbladder wall thickening or pericholecystic fluid. If there is persistent clinical concern, recommend further assessment with CT abdomen and pelvis or HIDA scan. Electronically Signed   By: Jacob Moores M.D.   On: 12/13/2022 15:14   DG Chest 2 View  Result Date: 12/13/2022 CLINICAL DATA:  Chest pain. EXAM: CHEST - 2 VIEW COMPARISON:  November 23, 2022. FINDINGS: The heart size and mediastinal contours are within normal limits. Both lungs are clear. The visualized skeletal structures are unremarkable. IMPRESSION: No active cardiopulmonary disease. Electronically Signed   By: Lupita Raider M.D.   On: 12/13/2022 12:16    Pending Labs Unresulted Labs (From admission, onward)     Start     Ordered   12/14/22 0500  CBC  Tomorrow morning,   R        12/13/22 1532   12/14/22 0500  Comprehensive metabolic panel  Tomorrow morning,   R        12/13/22 1532   12/13/22 1530  TSH  Add-on,   AD        12/13/22 1532   12/13/22 1529  HIV Antibody (routine testing w rflx)  (HIV Antibody (Routine testing w reflex) panel)  Once,   R        12/13/22 1532   12/13/22 1518  Respiratory (~20 pathogens) panel by PCR  (Respiratory panel by PCR (~20 pathogens, ~24 hr TAT)  w precautions)  Once,   URGENT        12/13/22 1517   12/13/22 1440  Blood culture (routine x 2)  BLOOD CULTURE X 2,   R (with STAT occurrences)      12/13/22 1439   12/13/22 1408  Lipase, blood  Once,   STAT        12/13/22 1407   12/13/22 1134  Urinalysis, Routine w reflex microscopic -Urine, Clean Catch  Once,   URGENT       Question:  Specimen Source  Answer:  Urine, Clean Catch   12/13/22 1134  Vitals/Pain Today's Vitals   12/13/22 1141 12/13/22 1230 12/13/22 1245 12/13/22 1246  BP: 105/70 109/72 107/75   Pulse: 92 67 64   Resp:  20 17   Temp: (!) 101.3 F (38.5 C)     TempSrc: Oral     SpO2: 98% 95% 96%   Weight:      Height:       PainSc:    0-No pain    Isolation Precautions No active isolations  Medications Medications  enoxaparin (LOVENOX) injection 40 mg (has no administration in time range)  sodium chloride flush (NS) 0.9 % injection 3 mL (has no administration in time range)  acetaminophen (TYLENOL) tablet 650 mg (has no administration in time range)    Or  acetaminophen (TYLENOL) suppository 650 mg (has no administration in time range)  ondansetron (ZOFRAN) tablet 4 mg (has no administration in time range)    Or  ondansetron (ZOFRAN) injection 4 mg (has no administration in time range)  albuterol (PROVENTIL) (2.5 MG/3ML) 0.083% nebulizer solution 2.5 mg (has no administration in time range)  potassium chloride SA (KLOR-CON M) CR tablet 40 mEq (has no administration in time range)  cefTRIAXone (ROCEPHIN) 2 g in sodium chloride 0.9 % 100 mL IVPB (has no administration in time range)  acetaminophen (TYLENOL) tablet 650 mg (650 mg Oral Given 12/13/22 1139)    Mobility walks     Focused Assessments    R Recommendations: See Admitting Provider Note  Report given to:   Additional Notes:  \

## 2022-12-13 NOTE — ED Notes (Signed)
Patient transported to X-ray 

## 2022-12-13 NOTE — H&P (Addendum)
History and Physical    Patient: Jared Park. SNK:[id number] DOB: 1965-09-07 DOA: 12/13/2022 DOS: the patient was seen and examined on 12/13/2022 PCP: Mliss Sax, MD  Patient coming from: Home  Chief Complaint:  Chief Complaint  Patient presents with   Fever   HPI: Jared Park. is a 57 y.o. male with medical history significant of GERD who presents with complaints of fever starting yesterday evening.  He had been seen in the ED on 3/25 for epigastric discomfort.  At that time he had been worked up and had negative troponins and CT scan of the abdomen and pelvis was negative for any acute abnormality.  Symptoms thought secondary to acid reflux for which she was started on an acid medicine.  Patient notes that he has intermittently had this epigastric discomfort off and on over the last year.  Symptoms usually occur couple hours after eating.  Previously symptoms would resolve quickly, but here recently had been lasting all day.  He reports having associated symptoms of nausea and vomiting and reports feeling bloated.  Yesterday, his wife at try to pick some eggs for breakfast, but he vomited that back up.  He had not been able to eat much of anything throughout the day and yesterday evening had developed a fever up to 100.7 F at home.  Yesterday evening patient reported having associated symptoms of urinary frequency.  Denies having any dysuria, shortness of breath, cough, or recent sick contacts to his knowledge he reports that he had been taking stool softener scheduled for quite some time and states that his stools have been light brown in color.  His wife brought him into the hospital for further evaluation is not clear the cause of his fever.  His wife reports that while in triage he did complain of feeling hot and dizzy.  She noticed that he was profusely sweating prior to passing out for short period in time.  Patient denied having any chest pain prior to this episode and  does not feel like he passed out per se.  In the emergency department patient was noted to be febrile up to 101.3 F with all other vital signs relatively maintained.  While in the triage patient was witnessed to have a syncopal episode while sitting in the chair.  Labs noted CBC within normal limits, potassium 3.1, total bilirubin 1.5, high-sensitivity troponin negative, lactic acid 1.3->1.7.  Right upper quadrant ultrasound was obtained which findings overall were thought equivocal to cholecystitis.  Blood cultures had been obtained.  General surgery was consulted.  Review of Systems: As mentioned in the history of present illness. All other systems reviewed and are negative. Past Medical History:  Diagnosis Date   COVID 03/2020   GERD (gastroesophageal reflux disease)    MMT (medial meniscus tear)    right   Past Surgical History:  Procedure Laterality Date   KNEE ARTHROSCOPY WITH MEDIAL MENISECTOMY Right 02/19/2021   Procedure: RIGHT KNEE ARTHROSCOPY WITH PARTIAL MEDIAL MENISECTOMY;  Surgeon: Tarry Kos, MD;  Location: [facility] OR;  Service: Orthopedics;  Laterality: Right;   WISDOM TOOTH EXTRACTION     age 75   Social History:  reports that he has never smoked. He has never used smokeless tobacco. He reports that he does not currently use alcohol. He reports that he does not use drugs.  No Known Allergies  Family History  Problem Relation Age of Onset   Diabetes Mother    Hyperlipidemia Mother  Hypertension Mother    Stroke Father    Healthy Sister    Healthy Brother    Colon polyps Neg Hx    Colon cancer Neg Hx    Esophageal cancer Neg Hx    Rectal cancer Neg Hx    Stomach cancer Neg Hx     Prior to Admission medications   Medication Sig Start Date End Date Taking? Authorizing Provider  predniSONE (STERAPRED UNI-PAK 21 TAB) 10 MG (21) TBPK tablet Take as directed 12/03/22   Tarry Kos, MD  traMADol (ULTRAM) 50 MG tablet Take 1-2 tablets (50-100 mg total) by mouth daily  as needed. 12/03/22   Tarry Kos, MD  atorvastatin (LIPITOR) 20 MG tablet Take 1 tablet (20 mg total) by mouth daily. 08/12/22   Mliss Sax, MD  ondansetron (ZOFRAN-ODT) 4 MG disintegrating tablet Take 1 tablet (4 mg total) by mouth every 8 (eight) hours as needed for nausea or vomiting. 11/23/22   Smitty Knudsen, PA-C  pantoprazole (PROTONIX) 40 MG tablet TAKE 1 TABLET(40 MG) BY MOUTH TWICE DAILY BEFORE A MEAL 09/28/22   Quentin Mulling R, PA-C  sucralfate (CARAFATE) 1 g tablet Take 1 tablet (1 g total) by mouth 4 (four) times daily -  with meals and at bedtime. 11/23/22   Smitty Knudsen, PA-C  tadalafil (CIALIS) 20 MG tablet Take 0.5-1 tablets (10-20 mg total) by mouth every other day as needed for erectile dysfunction. 07/15/22   Mliss Sax, MD    Physical Exam: Vitals:   12/13/22 1131 12/13/22 1141 12/13/22 1230 12/13/22 1245  BP:  105/70 109/72 107/75  Pulse:  92 67 64  Resp:   20 17  Temp:  (!) 101.3 F (38.5 C)    TempSrc:  Oral    SpO2:  98% 95% 96%  Weight: 76.7 kg     Height:  (1.676 m)      Exam  Constitutional: Middle-age male who appears sick but in no acute distress at this time Eyes: PERRL, lids and conjunctivae normal ENMT: Mucous membranes are moist.   Neck: normal, supple  Respiratory: clear to auscultation bilaterally, no wheezing, no crackles. Normal respiratory effort.   Cardiovascular: Regular rate and rhythm, no murmurs / rubs / gallops.  2+ pedal pulses. No carotid bruits.  Abdomen: Midline tenderness to palpation.  Positive bowel sounds in all 4 quadrants. Musculoskeletal: no clubbing / cyanosis. Good ROM, no contractures. Normal muscle tone.  Skin: no rashes, lesions, ulcers. No induration Neurologic: CN 2-12 grossly intact. Strength 5/5 in all 4.  Psychiatric: Normal judgment and insight. Alert and oriented x 3. Normal mood.   Data Reviewed:  EKG reveals sinus rhythm at 56 bpm.  Reviewed labs, imaging, and pertinent records  as noted above in the HPI.  Assessment and Plan: Fever Acute. Patient presented and was noted to have fever up to 101.3 F.  Chest x-ray showed no acute abnormality.  Patient did report having some urinary frequency.  No other SIRS criteria were met at this time.  Unclear cause of symptoms at this time. -Admit to a telemetry bed -Follow-up blood cultures -Check urinalysis -Empiric antibiotics Rocephin IV -Tylenol as needed for fever  Syncope Patient complained of feeling hot and dizzy and was sweaty profusely prior to passing out while sitting down in triage temporarily. .  No seizure-like activity was appreciated and patient came back to quickly.   High-sensitivity troponin was negative. Question orthostatic hypotension in the setting of decreased p.o. intake. -  Check TSH -Check echocardiogram -Follow-up telemetry  Possible cholecystitis Patient reports having epigastric discomfort usually couple hours after eating.  Reported associated symptoms of nausea and vomiting.  Right upper quadrant ultrasound noted layering sludge in the gallbladder and positive sonographic Murphy sign.  However no gallbladder wall thickening or pericholecystic fluid present.   -Check CT scan of the abdomen pelvis with contrast  Nausea and vomiting Epigastric abdominal pain Possibly related to above. Other causes for epigastric pain include PUD or gastritis. -Normal saline IV fluids  -Antiemetics as needed -Consider needed further workup based off findings  Hypokalemia Acute.  Initial potassium 3.1.  Thought secondary to patient's reports of nausea and vomiting. -Give  Potassium chloride 40 meq p.o.  -Continue to monitor and replace as needed  Elevated total bilirubin Acute.  Total bilirubin elevated 1.5.  Patient was noted to have gallbladder sludge on ultrasound. -Recheck CMP in a.m.  GERD -Protonix 40 mg IV twice daily   Advance Care Planning:   Code Status: Full Code  Consults: General  surgery  Family Communication: Patient's wife updated at bedside  Severity of Illness: The appropriate patient status for this patient is INPATIENT. Inpatient status is judged to be reasonable and necessary in order to provide the required intensity of service to ensure the patient's safety. The patient's presenting symptoms, physical exam findings, and initial radiographic and laboratory data in the context of their chronic comorbidities is felt to place them at high risk for further clinical deterioration. Furthermore, it is not anticipated that the patient will be medically stable for discharge from the hospital within 2 midnights of admission.   * I certify that at the point of admission it is my clinical judgment that the patient will require inpatient hospital care spanning beyond 2 midnights from the point of admission due to high intensity of service, high risk for further deterioration and high frequency of surveillance required.*  Author: Clydie Braun, MD 12/13/2022 3:15 PM  For on call review [email or web address].

## 2022-12-13 NOTE — ED Provider Notes (Signed)
[place] EMERGENCY DEPARTMENT AT [facility] Provider Note   CSN: [id number] Arrival date & time: 12/13/22  1105     History  Chief Complaint  Patient presents with   Fever    Jared Park. is a 57 y.o. male presenting to the ED with fever, feeling unwell.  Patient was seen in the ED on 11/23/22 for epigastric discomfort and chest pain.  He was diagnosed with possible reflux gastritis and started on PPI medications.  He has been having intermittent chest discomfort since then.  Last night into tonight he reported he began having a fever, wife set up to 101 at home, given Motrin, he has felt weak.  In triage today he had an episode while sitting in the chair he became very lightheaded and said he felt hot all over, and briefly may have lost consciousness,.  He is now back to baseline status mentally.  No sick contacts in the house.  He had a light cough the past few days but nothing persistent.  He denies headache, sore throat.  He denies me now that he is having any active chest pain or discomfort.  HPI     Home Medications Prior to Admission medications   Medication Sig Start Date End Date Taking? Authorizing Provider  predniSONE (STERAPRED UNI-PAK 21 TAB) 10 MG (21) TBPK tablet Take as directed 12/03/22   Tarry Kos, MD  traMADol (ULTRAM) 50 MG tablet Take 1-2 tablets (50-100 mg total) by mouth daily as needed. 12/03/22   Tarry Kos, MD  atorvastatin (LIPITOR) 20 MG tablet Take 1 tablet (20 mg total) by mouth daily. 08/12/22   Mliss Sax, MD  ondansetron (ZOFRAN-ODT) 4 MG disintegrating tablet Take 1 tablet (4 mg total) by mouth every 8 (eight) hours as needed for nausea or vomiting. 11/23/22   Smitty Knudsen, PA-C  pantoprazole (PROTONIX) 40 MG tablet TAKE 1 TABLET(40 MG) BY MOUTH TWICE DAILY BEFORE A MEAL 09/28/22   Quentin Mulling R, PA-C  sucralfate (CARAFATE) 1 g tablet Take 1 tablet (1 g total) by mouth 4 (four) times daily -  with meals and at  bedtime. 11/23/22   Smitty Knudsen, PA-C  tadalafil (CIALIS) 20 MG tablet Take 0.5-1 tablets (10-20 mg total) by mouth every other day as needed for erectile dysfunction. 07/15/22   Mliss Sax, MD      Allergies    Patient has no known allergies.    Review of Systems   Review of Systems  Physical Exam Updated Vital Signs BP 118/78   Pulse 76   Temp 99 F (37.2 C) (Oral)   Resp 18   Ht 5\' 6"  (1.676 m)   Wt 76.7 kg   SpO2 95%   BMI 27.29 kg/m  Physical Exam Constitutional:      General: He is not in acute distress. HENT:     Head: Normocephalic and atraumatic.  Eyes:     Conjunctiva/sclera: Conjunctivae normal.     Pupils: Pupils are equal, round, and reactive to light.  Cardiovascular:     Rate and Rhythm: Normal rate and regular rhythm.  Pulmonary:     Effort: Pulmonary effort is normal. No respiratory distress.  Abdominal:     General: There is no distension.     Tenderness: There is no abdominal tenderness.  Skin:    General: Skin is warm and dry.  Neurological:     General: No focal deficit present.     Mental  Status: He is alert and oriented to person, place, and time. Mental status is at baseline.  Psychiatric:        Mood and Affect: Mood normal.        Behavior: Behavior normal.     ED Results / Procedures / Treatments   Labs (all labs ordered are listed, but only abnormal results are displayed) Labs Reviewed  COMPREHENSIVE METABOLIC PANEL - Abnormal; Notable for the following components:      Result Value   Potassium 3.1 (*)    Chloride 96 (*)    Calcium 8.8 (*)    Albumin 3.4 (*)    AST 13 (*)    Total Bilirubin 1.5 (*)    All other components within normal limits  RESP PANEL BY RT-PCR (RSV, FLU A&B, COVID)  RVPGX2  CULTURE, BLOOD (ROUTINE X 2)  CULTURE, BLOOD (ROUTINE X 2)  RESPIRATORY PANEL BY PCR  LACTIC ACID, PLASMA  LACTIC ACID, PLASMA  CBC WITH DIFFERENTIAL/PLATELET  URINALYSIS, ROUTINE W REFLEX MICROSCOPIC  LIPASE, BLOOD   HIV ANTIBODY (ROUTINE TESTING W REFLEX)  TSH  CBC  COMPREHENSIVE METABOLIC PANEL  TROPONIN I (HIGH SENSITIVITY)  TROPONIN I (HIGH SENSITIVITY)    EKG EKG Interpretation  Date/Time:  Sunday December 13 2022 11:52:41 EDT Ventricular Rate:  56 PR Interval:  154 QRS Duration: 92 QT Interval:  377 QTC Calculation: 364 R Axis:   89 Text Interpretation: Sinus rhythm Confirmed by Alvester Chou [phone]) on 12/13/2022 12:14:23 PM  Radiology US Abdomen Limited RUQ (LIVER/GB)  Result Date: 12/13/2022 CLINICAL DATA:  Abdominal pain EXAM: ULTRASOUND ABDOMEN LIMITED RIGHT UPPER QUADRANT COMPARISON:  CT abdomen and pelvis without contrast November 23, 2022, abdominal ultrasound August 01, 2021 FINDINGS: Gallbladder: There is layering sludge within the gallbladder. No gallstones visualized. No gallbladder wall thickening or pericholecystic fluid. A positive sonographic Murphy's sign is noted by the sonographer. Common bile duct: Diameter: 5 mm Liver: No focal lesion identified. Within normal limits in parenchymal echogenicity. Portal vein is patent on color Doppler imaging with normal direction of blood flow towards the liver. Other: None. IMPRESSION: Findings are overall equivocal for acute cholecystitis. There is layering sludge in the gallbladder and a positive sonographic Murphy's sign is noted by the sonographer. However, there is no gallbladder wall thickening or pericholecystic fluid. If there is persistent clinical concern, recommend further assessment with CT abdomen and pelvis or HIDA scan. Electronically Signed   By: Jacob Moores M.D.   On: 12/13/2022 15:14   DG Chest 2 View  Result Date: 12/13/2022 CLINICAL DATA:  Chest pain. EXAM: CHEST - 2 VIEW COMPARISON:  November 23, 2022. FINDINGS: The heart size and mediastinal contours are within normal limits. Both lungs are clear. The visualized skeletal structures are unremarkable. IMPRESSION: No active cardiopulmonary disease. Electronically Signed    By: Lupita Raider M.D.   On: 12/13/2022 12:16    Procedures Procedures    Medications Ordered in ED Medications  enoxaparin (LOVENOX) injection 40 mg (has no administration in time range)  sodium chloride flush (NS) 0.9 % injection 3 mL (10 mLs Intravenous Not Given 12/13/22 1643)  acetaminophen (TYLENOL) tablet 650 mg (has no administration in time range)    Or  acetaminophen (TYLENOL) suppository 650 mg (has no administration in time range)  ondansetron (ZOFRAN) tablet 4 mg (has no administration in time range)    Or  ondansetron (ZOFRAN) injection 4 mg (has no administration in time range)  albuterol (PROVENTIL) (2.5 MG/3ML) 0.083% nebulizer solution 2.5  mg (has no administration in time range)  cefTRIAXone (ROCEPHIN) 2 g in sodium chloride 0.9 % 100 mL IVPB (0 g Intravenous Stopped 12/13/22 1712)  pantoprazole (PROTONIX) injection 40 mg (has no administration in time range)  0.9 %  sodium chloride infusion (has no administration in time range)  HYDROcodone-acetaminophen (NORCO/VICODIN) 5-325 MG per tablet 1 tablet (has no administration in time range)  acetaminophen (TYLENOL) tablet 650 mg (650 mg Oral Given 12/13/22 1139)  potassium chloride SA (KLOR-CON M) CR tablet 40 mEq (40 mEq Oral Given 12/13/22 1642)    ED Course/ Medical Decision Making/ A&P Clinical Course as of 12/13/22 1734  Sun Dec 13, 2022  1236 WBC, lactate, wnl [MT]  1526 Admitted to hospitalist, who requested general surgeon consult given equivocal findings on gallbladder ultrasound with sludge and positive sonographic Murphy sign.  Patient is reassessed and in agreement with hospitalization at this time for near syncopal episode, fever of unknown origin.  Blood cultures were sent off.  With normal blood test, no signs of sepsis, I would hold off on antibiotics until a potential clear source of infection is identified. [MT]    Clinical Course User Index [MT] Abhiraj Dozal, Kermit Balo, MD                              Medical Decision Making Amount and/or Complexity of Data Reviewed Labs: ordered. Radiology: ordered.  Risk OTC drugs. Decision regarding hospitalization.   This patient presents to the ED with concern for fever, near syncope. This involves an extensive number of treatment options, and is a complaint that carries with it a high risk of complications and morbidity.  The differential diagnosis includes viral illness vs bacterial infection vs inflammation vs other  Additional history obtained from patient's wife at bedside  I reviewed external records including EGD Jan 2024 report with no abnormal findings reported.  I ordered and personally interpreted labs.  The pertinent results include:  no emergent findings  I ordered imaging studies including dg chest, RUQ ultrasound I independently visualized and interpreted imaging which showed no acute intrathoracic findings; gallbladder sludge and sono murphy sign I agree with the radiologist interpretation  The patient was maintained on a cardiac monitor.  I personally viewed and interpreted the cardiac monitored which showed an underlying rhythm of: NSR  Per my interpretation the patient's ECG shows no acute ischemic findings  I have reviewed the patients home medicines and have made adjustments as needed   I requested consultation with the general surgery,  and discussed lab and imaging findings as well as pertinent plan - they recommend: will evaluate for potential biliary disease  After the interventions noted above, I reevaluated the patient and found that they have: stayed the same   Patient's near syncope episode in triage in the setting of feeling unwell, with prodrome of feeling hot, may be vasovagal episode.  Low suspicion this was a seizure - no prior history, headache, or post-ictal state, and no persistent clonus was observed by anyone.  But he's never had syncope before and was at rest when this episode happened, so  hospitalization for tele monitoring and potential echo may be reasonable.  He has no focal RUQ ttp on my abdominal exam and reports no abdominal pain currently, and has no tranaminitis.  My suspicion for acute cholecystitis overall is low, but with his ongoing episodes of post-prandial abdominal pain, and normal EGD showing no clear gastric source of  symptoms, I think surgery evaluation would be reasonable at this time, and appreciated.  Dispostion:  After consideration of the diagnostic results and the patients response to treatment, I feel that the patent would benefit from medical admission for near-syncope and fever of unknown origin evaluation.         Final Clinical Impression(s) / ED Diagnoses Final diagnoses:  Nausea and vomiting, unspecified vomiting type  Syncope, unspecified syncope type  Fever, unspecified fever cause    Rx / DC Orders ED Discharge Orders     None         Terald Sleeper, MD 12/13/22 1739

## 2022-12-14 ENCOUNTER — Inpatient Hospital Stay (HOSPITAL_COMMUNITY): Payer: 59

## 2022-12-14 ENCOUNTER — Encounter (HOSPITAL_COMMUNITY)
Admission: EM | Disposition: A | Discharge status: Home / Self Care | Payer: Self-pay | Source: Home / Self Care | Attending: Emergency Medicine

## 2022-12-14 ENCOUNTER — Encounter (HOSPITAL_COMMUNITY): Payer: Self-pay | Admitting: Internal Medicine

## 2022-12-14 ENCOUNTER — Inpatient Hospital Stay (HOSPITAL_BASED_OUTPATIENT_CLINIC_OR_DEPARTMENT_OTHER): Payer: 59 | Admitting: General Practice

## 2022-12-14 ENCOUNTER — Inpatient Hospital Stay (HOSPITAL_COMMUNITY): Payer: 59 | Admitting: General Practice

## 2022-12-14 ENCOUNTER — Other Ambulatory Visit: Payer: Self-pay

## 2022-12-14 DIAGNOSIS — R55 Syncope and collapse: Secondary | ICD-10-CM | POA: Diagnosis not present

## 2022-12-14 DIAGNOSIS — K219 Gastro-esophageal reflux disease without esophagitis: Secondary | ICD-10-CM | POA: Diagnosis not present

## 2022-12-14 DIAGNOSIS — K819 Cholecystitis, unspecified: Secondary | ICD-10-CM

## 2022-12-14 HISTORY — PX: CHOLECYSTECTOMY: SHX55

## 2022-12-14 LAB — CBC
HCT: 39.6 % (ref 39.0–52.0)
Hemoglobin: 13 g/dL (ref 13.0–17.0)
MCH: 28.1 pg (ref 26.0–34.0)
MCHC: 32.8 g/dL (ref 30.0–36.0)
MCV: 85.7 fL (ref 80.0–100.0)
Platelets: 261 10*3/uL (ref 150–400)
RBC: 4.62 MIL/uL (ref 4.22–5.81)
RDW: 13.4 % (ref 11.5–15.5)
WBC: 7.8 10*3/uL (ref 4.0–10.5)
nRBC: 0 % (ref 0.0–0.2)

## 2022-12-14 LAB — COMPREHENSIVE METABOLIC PANEL
ALT: 17 U/L (ref 0–44)
AST: 11 U/L — ABNORMAL LOW (ref 15–41)
Albumin: 2.7 g/dL — ABNORMAL LOW (ref 3.5–5.0)
Alkaline Phosphatase: 59 U/L (ref 38–126)
Anion gap: 11 (ref 5–15)
BUN: 9 mg/dL (ref 6–20)
CO2: 27 mmol/L (ref 22–32)
Calcium: 8.2 mg/dL — ABNORMAL LOW (ref 8.9–10.3)
Chloride: 98 mmol/L (ref 98–111)
Creatinine, Ser: 1.17 mg/dL (ref 0.61–1.24)
GFR, Estimated: >60 mL/min (ref >60)
Glucose, Bld: 101 mg/dL — ABNORMAL HIGH (ref 70–99)
Potassium: 3.9 mmol/L (ref 3.5–5.1)
Sodium: 136 mmol/L (ref 135–145)
Total Bilirubin: 1.4 mg/dL — ABNORMAL HIGH (ref 0.3–1.2)
Total Protein: 6.1 g/dL — ABNORMAL LOW (ref 6.5–8.1)

## 2022-12-14 LAB — RESPIRATORY PANEL BY PCR

## 2022-12-14 LAB — TSH: TSH: 0.422 u[IU]/mL (ref 0.350–4.500)

## 2022-12-14 LAB — HIV ANTIBODY (ROUTINE TESTING W REFLEX): HIV Screen 4th Generation wRfx: NONREACTIVE

## 2022-12-14 LAB — CULTURE, BLOOD (ROUTINE X 2): Special Requests: ADEQUATE

## 2022-12-14 SURGERY — LAPAROSCOPIC CHOLECYSTECTOMY WITH INTRAOPERATIVE CHOLANGIOGRAM
Anesthesia: General | Site: Abdomen

## 2022-12-14 MED ORDER — SODIUM CHLORIDE 0.9 % IR SOLN
Status: DC | PRN
  Administered 2022-12-14: 1000 mL

## 2022-12-14 MED ORDER — CHLORHEXIDINE GLUCONATE 0.12 % MT SOLN
OROMUCOSAL | Status: AC
  Administered 2022-12-14: 15 mL via OROMUCOSAL
  Filled 2022-12-14: qty 15

## 2022-12-14 MED ORDER — DEXAMETHASONE SODIUM PHOSPHATE 10 MG/ML IJ SOLN
INTRAMUSCULAR | Status: DC | PRN
  Administered 2022-12-14: 10 mg via INTRAVENOUS

## 2022-12-14 MED ORDER — FENTANYL CITRATE (PF) 100 MCG/2ML IJ SOLN: 25.0000 ug | INTRAMUSCULAR | Status: DC | PRN

## 2022-12-14 MED ORDER — PROPOFOL 10 MG/ML IV BOLUS
INTRAVENOUS | Status: AC
  Filled 2022-12-14: qty 20

## 2022-12-14 MED ORDER — HYDROMORPHONE HCL 1 MG/ML IJ SOLN: 1.0000 mg | INTRAMUSCULAR | Status: DC | PRN

## 2022-12-14 MED ORDER — FENTANYL CITRATE (PF) 250 MCG/5ML IJ SOLN
INTRAMUSCULAR | Status: DC | PRN
  Administered 2022-12-14: 100 ug via INTRAVENOUS
  Administered 2022-12-14: 50 ug via INTRAVENOUS

## 2022-12-14 MED ORDER — IOHEXOL 350 MG/ML SOLN
75.0000 mL | Freq: Once | INTRAVENOUS | Status: AC | PRN
  Administered 2022-12-14: 75 mL via INTRAVENOUS

## 2022-12-14 MED ORDER — 0.9 % SODIUM CHLORIDE (POUR BTL) OPTIME
TOPICAL | Status: DC | PRN
  Administered 2022-12-14: 1000 mL

## 2022-12-14 MED ORDER — OXYCODONE HCL 5 MG/5ML PO SOLN: 5.0000 mg | Freq: Once | ORAL | Status: DC | PRN

## 2022-12-14 MED ORDER — SUGAMMADEX SODIUM 200 MG/2ML IV SOLN
INTRAVENOUS | Status: DC | PRN
  Administered 2022-12-14: 200 mg via INTRAVENOUS

## 2022-12-14 MED ORDER — LIDOCAINE 2% (20 MG/ML) 5 ML SYRINGE
INTRAMUSCULAR | Status: DC | PRN
  Administered 2022-12-14: 80 mg via INTRAVENOUS

## 2022-12-14 MED ORDER — ACETAMINOPHEN 160 MG/5ML PO SOLN: 1000.0000 mg | Freq: Once | ORAL | Status: DC | PRN

## 2022-12-14 MED ORDER — ONDANSETRON HCL 4 MG/2ML IJ SOLN
INTRAMUSCULAR | Status: DC | PRN
  Administered 2022-12-14: 4 mg via INTRAVENOUS

## 2022-12-14 MED ORDER — FENTANYL CITRATE (PF) 250 MCG/5ML IJ SOLN
INTRAMUSCULAR | Status: AC
  Filled 2022-12-14: qty 5

## 2022-12-14 MED ORDER — BUPIVACAINE HCL (PF) 0.25 % IJ SOLN
INTRAMUSCULAR | Status: AC
  Filled 2022-12-14: qty 30

## 2022-12-14 MED ORDER — ACETAMINOPHEN 10 MG/ML IV SOLN
INTRAVENOUS | Status: AC
  Filled 2022-12-14: qty 100

## 2022-12-14 MED ORDER — BUPIVACAINE HCL 0.25 % IJ SOLN
INTRAMUSCULAR | Status: DC | PRN
  Administered 2022-12-14: 20 mL

## 2022-12-14 MED ORDER — LACTATED RINGERS IV SOLN: INTRAVENOUS | Status: DC

## 2022-12-14 MED ORDER — MIDAZOLAM HCL 2 MG/2ML IJ SOLN
INTRAMUSCULAR | Status: AC
  Filled 2022-12-14: qty 2

## 2022-12-14 MED ORDER — CHLORHEXIDINE GLUCONATE 0.12 % MT SOLN: 15.0000 mL | Freq: Once | OROMUCOSAL | Status: AC

## 2022-12-14 MED ORDER — ACETAMINOPHEN 10 MG/ML IV SOLN: 1000.0000 mg | Freq: Once | INTRAVENOUS | Status: DC | PRN

## 2022-12-14 MED ORDER — ROCURONIUM BROMIDE 10 MG/ML (PF) SYRINGE
PREFILLED_SYRINGE | INTRAVENOUS | Status: DC | PRN
  Administered 2022-12-14: 60 mg via INTRAVENOUS

## 2022-12-14 MED ORDER — ORAL CARE MOUTH RINSE: 15.0000 mL | Freq: Once | OROMUCOSAL | Status: AC

## 2022-12-14 MED ORDER — KETOROLAC TROMETHAMINE 30 MG/ML IJ SOLN
INTRAMUSCULAR | Status: DC | PRN
  Administered 2022-12-14: 30 mg via INTRAVENOUS

## 2022-12-14 MED ORDER — PROPOFOL 10 MG/ML IV BOLUS
INTRAVENOUS | Status: DC | PRN
  Administered 2022-12-14: 160 mg via INTRAVENOUS

## 2022-12-14 MED ORDER — IOHEXOL 9 MG/ML PO SOLN
500.0000 mL | ORAL | Status: AC
  Administered 2022-12-14 (×2): 500 mL via ORAL

## 2022-12-14 MED ORDER — ACETAMINOPHEN 10 MG/ML IV SOLN
INTRAVENOUS | Status: DC | PRN
  Administered 2022-12-14: 1000 mg via INTRAVENOUS

## 2022-12-14 MED ORDER — HYDROCODONE-ACETAMINOPHEN 5-325 MG PO TABS
1.0000 | ORAL_TABLET | ORAL | Status: DC | PRN
  Administered 2022-12-14: 2 via ORAL
  Administered 2022-12-14: 1 via ORAL
  Filled 2022-12-14: qty 1
  Filled 2022-12-14: qty 2

## 2022-12-14 MED ORDER — ACETAMINOPHEN 500 MG PO TABS: 1000.0000 mg | ORAL_TABLET | Freq: Once | ORAL | Status: DC | PRN

## 2022-12-14 MED ORDER — OXYCODONE HCL 5 MG PO TABS: 5.0000 mg | ORAL_TABLET | Freq: Once | ORAL | Status: DC | PRN

## 2022-12-14 MED ORDER — MIDAZOLAM HCL 2 MG/2ML IJ SOLN
INTRAMUSCULAR | Status: DC | PRN
  Administered 2022-12-14: 2 mg via INTRAVENOUS

## 2022-12-14 MED ORDER — IOHEXOL 9 MG/ML PO SOLN: 500.0000 mL | ORAL | Status: AC

## 2022-12-14 SURGICAL SUPPLY — 41 items
ADH SKN CLS APL DERMABOND .7 (GAUZE/BANDAGES/DRESSINGS) ×1
APPLIER CLIP 5 13 M/L LIGAMAX5 (MISCELLANEOUS) ×1
BAG COUNTER SPONGE SURGICOUNT (BAG) ×1 IMPLANT
BAG SPNG CNTER NS LX DISP (BAG) ×1
CANISTER SUCT 3000ML PPV (MISCELLANEOUS) ×1 IMPLANT
CHLORAPREP W/TINT 26 (MISCELLANEOUS) ×1 IMPLANT
CLIP APPLIE 5 13 M/L LIGAMAX5 (MISCELLANEOUS) ×1 IMPLANT
CLIP LIGATING HEMO O LOK GREEN (MISCELLANEOUS) IMPLANT
COVER MAYO STAND STRL (DRAPES) IMPLANT
COVER SURGICAL LIGHT HANDLE (MISCELLANEOUS) ×1 IMPLANT
COVER TRANSDUCER ULTRASND (DRAPES) IMPLANT
DERMABOND ADVANCED .7 DNX12 (GAUZE/BANDAGES/DRESSINGS) ×1 IMPLANT
DRAPE C-ARM 42X120 X-RAY (DRAPES) IMPLANT
ELECT REM PT RETURN 9FT ADLT (ELECTROSURGICAL) ×1
ELECTRODE REM PT RTRN 9FT ADLT (ELECTROSURGICAL) ×1 IMPLANT
GLOVE SURG SIGNA 7.5 PF LTX (GLOVE) ×1 IMPLANT
GOWN STRL REUS W/ TWL LRG LVL3 (GOWN DISPOSABLE) ×2 IMPLANT
GOWN STRL REUS W/ TWL XL LVL3 (GOWN DISPOSABLE) ×1 IMPLANT
GOWN STRL REUS W/TWL LRG LVL3 (GOWN DISPOSABLE) ×2
GOWN STRL REUS W/TWL XL LVL3 (GOWN DISPOSABLE) ×1
IRRIG SUCT STRYKERFLOW 2 WTIP (MISCELLANEOUS) ×1
IRRIGATION SUCT STRKRFLW 2 WTP (MISCELLANEOUS) ×1 IMPLANT
KIT BASIN OR (CUSTOM PROCEDURE TRAY) ×1 IMPLANT
KIT TURNOVER KIT B (KITS) ×1 IMPLANT
NS IRRIG 1000ML POUR BTL (IV SOLUTION) ×1 IMPLANT
PAD ARMBOARD 7.5X6 YLW CONV (MISCELLANEOUS) ×1 IMPLANT
SCISSORS LAP 5X35 DISP (ENDOMECHANICALS) ×1 IMPLANT
SET TUBE SMOKE EVAC HIGH FLOW (TUBING) ×1 IMPLANT
SLEEVE Z-THREAD 5X100MM (TROCAR) ×2 IMPLANT
SPECIMEN JAR SMALL (MISCELLANEOUS) ×1 IMPLANT
SUT MNCRL AB 4-0 PS2 18 (SUTURE) ×1 IMPLANT
SUT VICRYL 0 UR6 27IN ABS (SUTURE) IMPLANT
SYS BAG RETRIEVAL 10MM (BASKET) ×1
SYSTEM BAG RETRIEVAL 10MM (BASKET) ×1 IMPLANT
TOWEL GREEN STERILE (TOWEL DISPOSABLE) ×1 IMPLANT
TOWEL GREEN STERILE FF (TOWEL DISPOSABLE) ×1 IMPLANT
TRAY LAPAROSCOPIC MC (CUSTOM PROCEDURE TRAY) ×1 IMPLANT
TROCAR BALLN 12MMX100 BLUNT (TROCAR) ×1 IMPLANT
TROCAR Z-THREAD OPTICAL 5X100M (TROCAR) ×1 IMPLANT
WARMER LAPAROSCOPE (MISCELLANEOUS) ×1 IMPLANT
WATER STERILE IRR 1000ML POUR (IV SOLUTION) ×1 IMPLANT

## 2022-12-14 NOTE — Op Note (Signed)
Laparoscopic Cholecystectomy Procedure Note  Indications: This patient presents with symptomatic gallbladder disease and will undergo laparoscopic cholecystectomy.  Pre-operative Diagnosis: acute cholecystitis  Post-operative Diagnosis: acute cholecystitis with cholelithiasis  Surgeon: Abigail Miyamoto   Assistants: none  Anesthesia: General endotracheal anesthesia  ASA Class: 2  Procedure Details  The patient was seen again in the Holding Room. The risks, benefits, complications, treatment options, and expected outcomes were discussed with the patient. The possibilities of reaction to medication, pulmonary aspiration, perforation of viscus, bleeding, recurrent infection, finding a normal gallbladder, the need for additional procedures, failure to diagnose a condition, the possible need to convert to an open procedure, and creating a complication requiring transfusion or operation were discussed with the patient. The likelihood of improving the patient's symptoms with return to their baseline status is good.  The patient and/or family concurred with the proposed plan, giving informed consent. The site of surgery properly noted. The patient was taken to Operating Room, identified as Jared Park. and the procedure verified as Laparoscopic Cholecystectomy with Intraoperative Cholangiogram. A Time Out was held and the above information confirmed.  Prior to the induction of general anesthesia, antibiotic prophylaxis was administered. General endotracheal anesthesia was then administered and tolerated well. After the induction, the abdomen was prepped with Chloraprep and draped in sterile fashion. The patient was positioned in the supine position.  Local anesthetic agent was injected into the skin near the umbilicus and an incision made. We dissected down to the abdominal fascia with blunt dissection.  The fascia was incised vertically and we entered the peritoneal cavity bluntly.  A pursestring  suture of 0-Vicryl was placed around the fascial opening.  The Hasson cannula was inserted and secured with the stay suture.  Pneumoperitoneum was then created with CO2 and tolerated well without any adverse changes in the patient's vital signs. A 5-mm port was placed in the subxiphoid position.  Two 5-mm ports were placed in the right upper quadrant. All skin incisions were infiltrated with a local anesthetic agent before making the incision and placing the trocars.   We positioned the patient in reverse Trendelenburg, tilted slightly to the patient's left.  The gallbladder was identified, the fundus grasped and retracted cephalad. Adhesions were lysed bluntly and with the electrocautery where indicated, taking care not to injure any adjacent organs or viscus. The gallbladder was distended and acutely inflamed.  Bile had to be aspirated from the gallbladder in order to grasp it.The infundibulum was then grasped and retracted laterally, exposing the peritoneum overlying the triangle of Calot. This was then divided and exposed in a blunt fashion. The cystic duct was clearly identified and bluntly dissected circumferentially. A critical view of the cystic duct and cystic artery was obtained.  The cystic duct was then ligated with clips and divided. The cystic artery was, dissected free, ligated with clips and divided as well.   The gallbladder was dissected from the liver bed in retrograde fashion with the electrocautery. The gallbladder was removed and placed in an Endocatch sac. The liver bed was irrigated and inspected. Hemostasis was achieved with the electrocautery. Copious irrigation was utilized and was repeatedly aspirated until clear.  The gallbladder and Endocatch sac were then removed through the umbilical port site.  The pursestring suture was used to close the umbilical fascia.    We again inspected the right upper quadrant for hemostasis.  Pneumoperitoneum was released as we removed the trocars.   4-0 Monocryl was used to close the skin.  Skin  glue was then applied. The patient was then extubated and brought to the recovery room in stable condition. Instrument, sponge, and needle counts were correct at closure and at the conclusion of the case.   Findings: Acute Cholecystitis with Cholelithiasis  Estimated Blood Loss: Minimal         Drains: none         Specimens: Gallbladder           Complications: None; patient tolerated the procedure well.         Disposition: PACU - hemodynamically stable.         Condition: stable

## 2022-12-14 NOTE — Anesthesia Preprocedure Evaluation (Signed)
Anesthesia Evaluation  Patient identified by MRN, date of birth, ID band Patient awake    Reviewed: Allergy & Precautions, NPO status , Patient's Chart, lab work & pertinent test results  History of Anesthesia Complications Negative for: history of anesthetic complications  Airway Mallampati: II  TM Distance: >3 FB Neck ROM: Full    Dental  (+) Teeth Intact, Dental Advisory Given,    Pulmonary neg pulmonary ROS   breath sounds clear to auscultation       Cardiovascular negative cardio ROS  Rhythm:Regular     Neuro/Psych  Headaches  Neuromuscular disease  negative psych ROS   GI/Hepatic ,GERD  ,,Cholecystitis   Endo/Other  negative endocrine ROS    Renal/GU negative Renal ROS     Musculoskeletal  (+) Arthritis ,    Abdominal   Peds  Hematology negative hematology ROS (+) Lab Results      Component                Value               Date                      WBC                      7.8                 12/14/2022                HGB                      13.0                12/14/2022                HCT                      39.6                12/14/2022                MCV                      85.7                12/14/2022                PLT                      261                 12/14/2022              Anesthesia Other Findings   Reproductive/Obstetrics                             Anesthesia Physical Anesthesia Plan  ASA: 2  Anesthesia Plan: General   Post-op Pain Management: Ofirmev IV (intra-op)* and Toradol IV (intra-op)*   Induction: Intravenous  PONV Risk Score and Plan: 2 and Ondansetron and Dexamethasone  Airway Management Planned: Oral ETT  Additional Equipment: None  Intra-op Plan:   Post-operative Plan: Extubation in OR  Informed Consent: I have reviewed the patients History and Physical, chart, labs and discussed the procedure including the risks, benefits and  alternatives for the proposed anesthesia with the  patient or authorized representative who has indicated his/her understanding and acceptance.     Dental advisory given  Plan Discussed with: CRNA  Anesthesia Plan Comments:        Anesthesia Quick Evaluation

## 2022-12-14 NOTE — Plan of Care (Signed)

## 2022-12-14 NOTE — Anesthesia Procedure Notes (Signed)
Procedure Name: Intubation Date/Time: 12/14/2022 12:31 PM  Performed by: Maxine Glenn, CRNAPre-anesthesia Checklist: Patient identified, Emergency Drugs available, Suction available and Patient being monitored Patient Re-evaluated:Patient Re-evaluated prior to induction Oxygen Delivery Method: Circle System Utilized Preoxygenation: Pre-oxygenation with 100% oxygen Induction Type: IV induction Ventilation: Mask ventilation without difficulty Laryngoscope Size: Mac and 4 Grade View: Grade II Tube type: Oral Tube size: 7.5 mm Number of attempts: 1 Airway Equipment and Method: Stylet Placement Confirmation: ETT inserted through vocal cords under direct vision, positive ETCO2 and breath sounds checked- equal and bilateral Secured at: 23 cm Tube secured with: Tape Dental Injury: Teeth and Oropharynx as per pre-operative assessment

## 2022-12-14 NOTE — Care Management (Signed)
  Transition of Care [facility]) Screening Note   Patient Details  Name: Jared Park. Date of Birth: 03/25/1966   Transition of Care [facility]) CM/SW Contact:    Gordy Clement, RN Phone Number: 12/14/2022, 3:34 PM    Transition of Care Department [facility]) has reviewed patient and no TOC needs have been identified at this time. We will continue to monitor patient advancement through interdisciplinary progression rounds. If new patient transition needs arise, please place a TOC consult.

## 2022-12-14 NOTE — Transfer of Care (Signed)
Immediate Anesthesia Transfer of Care Note  Patient: Jared Park.  Procedure(s) Performed: LAPAROSCOPIC CHOLECYSTECTOMY WITH INTRAOPERATIVE CHOLANGIOGRAM (Abdomen)  Patient Location: PACU  Anesthesia Type:General  Level of Consciousness: awake and alert   Airway & Oxygen Therapy: Patient Spontanous Breathing  Post-op Assessment: Report given to RN and Post -op Vital signs reviewed and stable  Post vital signs: Reviewed and stable  Last Vitals:  Vitals Value Taken Time  BP 116/89 12/14/22 1317  Temp    Pulse 72 12/14/22 1318  Resp 14 12/14/22 1318  SpO2 92 % 12/14/22 1318  Vitals shown include unvalidated device data.  Last Pain:  Vitals:   12/14/22 1144  TempSrc: Oral  PainSc: 0-No pain      Patients Stated Pain Goal: 2 (12/13/22 2100)  Complications: No notable events documented.

## 2022-12-14 NOTE — Progress Notes (Signed)
Subjective: Patient's patient is controlled right now.  He does state his pain comes usually within a couple hours after eating and is associated with N/V.  The carafate and PPI he has been given have not helped his symptoms.  He is febrile here, despite Rocephin.  ROS: See above, otherwise other systems negative  Objective: Vital signs in last 24 hours: Temp:  [99 F (37.2 C)-101.3 F (38.5 C)] 99 F (37.2 C) (04/15 0240) Pulse Rate:  [64-92] 68 (04/15 0240) Resp:  [15-21] 15 (04/15 0240) BP: (105-118)/(70-78) 113/74 (04/15 0240) SpO2:  [95 %-99 %] 99 % (04/15 0240) Weight:  [76.7 kg] 76.7 kg (04/14 1131) Last BM Date : 12/12/22  Intake/Output from previous day: 04/14 0701 - 04/15 0700 In: 908.4 [I.V.:815.1; IV Piggyback:93.3] Out: -  Intake/Output this shift: No intake/output data recorded.  PE: Gen: NAD Heart: regular Lungs: CTAB Abd: soft, mildly tender in epigastrium and RUQ, nontender elsewhere, ND, +BS  Lab Results:  Recent Labs    12/13/22 1144 12/14/22 0456  WBC 6.7 7.8  HGB 14.8 13.0  HCT 46.1 39.6  PLT 319 261   BMET Recent Labs    12/13/22 1144 12/14/22 0456  NA 137 136  K 3.1* 3.9  CL 96* 98  CO2 29 27  GLUCOSE 99 101*  BUN 7 9  CREATININE 1.09 1.17  CALCIUM 8.8* 8.2*   PT/INR No results for input(s): "LABPROT", "INR" in the last 72 hours. CMP     Component Value Date/Time   NA 136 12/14/2022 0456   NA 140 02/23/2018 1203   K 3.9 12/14/2022 0456   CL 98 12/14/2022 0456   CO2 27 12/14/2022 0456   GLUCOSE 101 (H) 12/14/2022 0456   BUN 9 12/14/2022 0456   BUN 10 02/23/2018 1203   CREATININE 1.17 12/14/2022 0456   CALCIUM 8.2 (L) 12/14/2022 0456   PROT 6.1 (L) 12/14/2022 0456   PROT 6.7 02/23/2018 1203   ALBUMIN 2.7 (L) 12/14/2022 0456   ALBUMIN 4.2 02/23/2018 1203   AST 11 (L) 12/14/2022 0456   ALT 17 12/14/2022 0456   ALKPHOS 59 12/14/2022 0456   BILITOT 1.4 (H) 12/14/2022 0456   BILITOT 0.4 02/23/2018 1203   GFRNONAA  >60 12/14/2022 0456   GFRAA >60 04/21/2020 0915   Lipase     Component Value Date/Time   LIPASE 48 12/13/2022 1806       Studies/Results: US Abdomen Limited RUQ (LIVER/GB)  Result Date: 12/13/2022 CLINICAL DATA:  Abdominal pain EXAM: ULTRASOUND ABDOMEN LIMITED RIGHT UPPER QUADRANT COMPARISON:  CT abdomen and pelvis without contrast November 23, 2022, abdominal ultrasound August 01, 2021 FINDINGS: Gallbladder: There is layering sludge within the gallbladder. No gallstones visualized. No gallbladder wall thickening or pericholecystic fluid. A positive sonographic Murphy's sign is noted by the sonographer. Common bile duct: Diameter: 5 mm Liver: No focal lesion identified. Within normal limits in parenchymal echogenicity. Portal vein is patent on color Doppler imaging with normal direction of blood flow towards the liver. Other: None. IMPRESSION: Findings are overall equivocal for acute cholecystitis. There is layering sludge in the gallbladder and a positive sonographic Murphy's sign is noted by the sonographer. However, there is no gallbladder wall thickening or pericholecystic fluid. If there is persistent clinical concern, recommend further assessment with CT abdomen and pelvis or HIDA scan. Electronically Signed   By: Jacob Moores M.D.   On: 12/13/2022 15:14   DG Chest 2 View  Result Date: 12/13/2022 CLINICAL DATA:  Chest pain. EXAM: CHEST - 2 VIEW COMPARISON:  November 23, 2022. FINDINGS: The heart size and mediastinal contours are within normal limits. Both lungs are clear. The visualized skeletal structures are unremarkable. IMPRESSION: No active cardiopulmonary disease. Electronically Signed   By: Lupita Raider M.D.   On: 12/13/2022 12:16    Anti-infectives: Anti-infectives (From admission, onward)    Start     Dose/Rate Route Frequency Ordered Stop   12/13/22 1545  cefTRIAXone (ROCEPHIN) 2 g in sodium chloride 0.9 % 100 mL IVPB        2 g 200 mL/hr over 30 Minutes Intravenous Every  24 hours 12/13/22 1534          Assessment/Plan RUQ abdominal pain, gallbladder sludge -the patient has had outpatient work up with GI which has been negative -he is having biliary symptoms -gallbladder with just sludge, but no wall thickening or pericholecystic fluid; however he has had a fever of 101.3 despite Rocephin. -WBC normal, TB 1.4 from 1.5 on admit.  Other LFTs unremarkable. -he is awaiting a CT scan, but if this is negative for any other etiology, will plan for lap chole this admission, maybe later today pending timing of all of his tests. -remain NPO -discussed plan with he and his wife and they are both in agreement. -discussed with primary service as well.  FEN - NPO,IVFs VTE - Lovenox ID - Rocephin  GERD  I reviewed hospitalist notes, last 24 h vitals and pain scores, last 48 h intake and output, last 24 h labs and trends, and last 24 h imaging results.   LOS: 1 day    Letha Cape , [facility] 12/14/2022, 8:50 AM Please see Amion for pager number during day hours 7:00am-4:30pm or 7:00am -11:30am on weekends

## 2022-12-14 NOTE — Progress Notes (Addendum)
PROGRESS NOTE        PATIENT DETAILS Name: Jared Park. Age: 57 y.o. Sex: male Date of Birth: [birth year] Admit Date: 12/13/2022 Admitting Physician Clydie Braun, MD ZOX:[id number], Talmadge Coventry, MD  Brief Summary: Patient is a 57 y.o.  male with history of GERD-presented to the ED with frequent/colicky sounding epigastric/upper abdominal pain associated with fever.  While in the ED he sustained a syncopal episode.  See below for further details.  Significant events: 4/14>> admit to [facility]  Significant studies: 4/14>> RUQ ultrasound: Layering sludge-positive sonographicMurphy sign  Significant microbiology data: 4/14>> COVID/influenza/RSV PCR: Negative 4/14>> blood culture: Negative  Procedures: None  Consults: Renal surgery  Subjective: Lying comfortably in bed-denies any chest pain or shortness of breath.  Continues to have epigastric/right upper/mid upper abdominal pain.  No vomiting today but did vomit yesterday.  Objective: Vitals: Blood pressure 113/74, pulse 68, temperature 99 F (37.2 C), temperature source Oral, resp. rate 15, height 5\' 6"  (1.676 m), weight 76.7 kg, SpO2 99 %.   Exam: Gen Exam:Alert awake-not in any distress HEENT:atraumatic, normocephalic Chest: B/L clear to auscultation anteriorly CVS:S1S2 regular Abdomen: Soft-somewhat tender from the right mid abdominal area. Extremities:no edema Neurology: Non focal Skin: no rash  Pertinent Labs/Radiology:    Latest Ref Rng & Units 12/14/2022    4:56 AM 12/13/2022   11:44 AM 11/23/2022   12:43 PM  CBC  WBC 4.0 - 10.5 K/uL 7.8  6.7  6.0   Hemoglobin 13.0 - 17.0 g/dL 40.9  81.1  91.4   Hematocrit 39.0 - 52.0 % 39.6  46.1  44.9   Platelets 150 - 400 K/uL 261  319  288     Lab Results  Component Value Date   NA 136 12/14/2022   K 3.9 12/14/2022   CL 98 12/14/2022   CO2 27 12/14/2022     Assessment/Plan: Syncope Suspected vasovagal etiology Telemetry overnight  unremarkable Await echo  Probable Acute calculus cholecystitis/Biliary colic Await CT scan Empirically on Rocephin General surgery following  GERD EGD January 2024 was unremarkable PPI  HLD Statin on hold  BMI: Estimated body mass index is 27.29 kg/m as calculated from the following:   Height as of this encounter: 5\' 6"  (1.676 m).   Weight as of this encounter: 76.7 kg.   Code status:   Code Status: Full Code   DVT Prophylaxis: enoxaparin (LOVENOX) injection 40 mg Start: 12/13/22 2000   Family Communication: Spouse at bedside   Disposition Plan: Status is: Inpatient Remains inpatient appropriate because: Severity of illness   Planned Discharge Destination:Home   Diet: Diet Order             Diet NPO time specified Except for: Sips with Meds  Diet effective midnight                     Antimicrobial agents: Anti-infectives (From admission, onward)    Start     Dose/Rate Route Frequency Ordered Stop   12/13/22 1545  cefTRIAXone (ROCEPHIN) 2 g in sodium chloride 0.9 % 100 mL IVPB        2 g 200 mL/hr over 30 Minutes Intravenous Every 24 hours 12/13/22 1534          MEDICATIONS: Scheduled Meds:  enoxaparin (LOVENOX) injection  40 mg Subcutaneous Q24H   iohexol  500 mL Oral  Q1H   pantoprazole (PROTONIX) IV  40 mg Intravenous Q12H   sodium chloride flush  3 mL Intravenous Q12H   Continuous Infusions:  sodium chloride 75 mL/hr at 12/14/22 0245   cefTRIAXone (ROCEPHIN)  IV Stopped (12/13/22 1712)   PRN Meds:.acetaminophen **OR** acetaminophen, albuterol, HYDROcodone-acetaminophen, ondansetron **OR** ondansetron (ZOFRAN) IV   I have personally reviewed following labs and imaging studies  LABORATORY DATA: CBC: Recent Labs  Lab 12/13/22 1144 12/14/22 0456  WBC 6.7 7.8  NEUTROABS 5.2  --   HGB 14.8 13.0  HCT 46.1 39.6  MCV 87.0 85.7  PLT 319 261    Basic Metabolic Panel: Recent Labs  Lab 12/13/22 1144 12/14/22 0456  NA 137 136  K  3.1* 3.9  CL 96* 98  CO2 29 27  GLUCOSE 99 101*  BUN 7 9  CREATININE 1.09 1.17  CALCIUM 8.8* 8.2*    GFR: Estimated Creatinine Clearance: 70.4 mL/min (by C-G formula based on SCr of 1.17 mg/dL).  Liver Function Tests: Recent Labs  Lab 12/13/22 1144 12/14/22 0456  AST 13* 11*  ALT 16 17  ALKPHOS 76 59  BILITOT 1.5* 1.4*  PROT 7.0 6.1*  ALBUMIN 3.4* 2.7*   Recent Labs  Lab 12/13/22 1806  LIPASE 48   No results for input(s): "AMMONIA" in the last 168 hours.  Coagulation Profile: No results for input(s): "INR", "PROTIME" in the last 168 hours.  Cardiac Enzymes: No results for input(s): "CKTOTAL", "CKMB", "CKMBINDEX", "TROPONINI" in the last 168 hours.  BNP (last 3 results) No results for input(s): "PROBNP" in the last 8760 hours.  Lipid Profile: No results for input(s): "CHOL", "HDL", "LDLCALC", "TRIG", "CHOLHDL", "LDLDIRECT" in the last 72 hours.  Thyroid Function Tests: Recent Labs    12/14/22 0456  TSH 0.422    Anemia Panel: No results for input(s): "VITAMINB12", "FOLATE", "FERRITIN", "TIBC", "IRON", "RETICCTPCT" in the last 72 hours.  Urine analysis:    Component Value Date/Time   COLORURINE YELLOW 07/13/2022 1555   APPEARANCEUR CLEAR 07/13/2022 1555   LABSPEC 1.025 07/13/2022 1555   PHURINE 6.0 07/13/2022 1555   GLUCOSEU NEGATIVE 07/13/2022 1555   HGBUR NEGATIVE 07/13/2022 1555   BILIRUBINUR NEGATIVE 07/13/2022 1555   KETONESUR NEGATIVE 07/13/2022 1555   PROTEINUR 100 (A) 04/21/2020 1309   UROBILINOGEN 0.2 07/13/2022 1555   NITRITE NEGATIVE 07/13/2022 1555   LEUKOCYTESUR NEGATIVE 07/13/2022 1555    Sepsis Labs: Lactic Acid, Venous    Component Value Date/Time   LATICACIDVEN 1.7 12/13/2022 1205    MICROBIOLOGY: Recent Results (from the past 240 hour(s))  Resp panel by RT-PCR (RSV, Flu A&B, Covid) Anterior Nasal Swab     Status: None   Collection Time: 12/13/22 12:05 PM   Specimen: Anterior Nasal Swab  Result Value Ref Range Status    SARS Coronavirus 2 by RT PCR NEGATIVE NEGATIVE Final   Influenza A by PCR NEGATIVE NEGATIVE Final   Influenza B by PCR NEGATIVE NEGATIVE Final    Comment: (NOTE) The Xpert Xpress SARS-CoV-2/FLU/RSV plus assay is intended as an aid in the diagnosis of influenza from Nasopharyngeal swab specimens and should not be used as a sole basis for treatment. Nasal washings and aspirates are unacceptable for Xpert Xpress SARS-CoV-2/FLU/RSV testing.  Fact Sheet for Patients: BloggerCourse.com  Fact Sheet for Healthcare Providers: SeriousBroker.it  This test is not yet approved or cleared by the [place] FDA and has been authorized for detection and/or diagnosis of SARS-CoV-2 by FDA under an Emergency Use Authorization (EUA). This EUA will  remain in effect (meaning this test can be used) for the duration of the COVID-19 declaration under Section 564(b)(1) of the Act, 21 U.S.C. section 360bbb-3(b)(1), unless the authorization is terminated or revoked.     Resp Syncytial Virus by PCR NEGATIVE NEGATIVE Final    Comment: (NOTE) Fact Sheet for Patients: BloggerCourse.com  Fact Sheet for Healthcare Providers: SeriousBroker.it  This test is not yet approved or cleared by the [place] FDA and has been authorized for detection and/or diagnosis of SARS-CoV-2 by FDA under an Emergency Use Authorization (EUA). This EUA will remain in effect (meaning this test can be used) for the duration of the COVID-19 declaration under Section 564(b)(1) of the Act, 21 U.S.C. section 360bbb-3(b)(1), unless the authorization is terminated or revoked.  Performed at [facility], [address] [place], [place] [address]   Blood culture (routine x 2)     Status: None (Preliminary result)   Collection Time: 12/13/22  6:07 PM   Specimen: BLOOD RIGHT ARM  Result Value Ref Range Status   Specimen  Description BLOOD RIGHT ARM  Final   Special Requests   Final    BOTTLES DRAWN AEROBIC ONLY Blood Culture adequate volume   Culture   Final    NO GROWTH < 12 HOURS Performed at [facility], [address] [place], [place] [address]    Report Status PENDING  Incomplete  Blood culture (routine x 2)     Status: None (Preliminary result)   Collection Time: 12/13/22  6:07 PM   Specimen: BLOOD RIGHT ARM  Result Value Ref Range Status   Specimen Description BLOOD RIGHT ARM  Final   Special Requests   Final    BOTTLES DRAWN AEROBIC ONLY Blood Culture adequate volume   Culture   Final    NO GROWTH < 12 HOURS Performed at [facility], [address] [place], [place] [address]    Report Status PENDING  Incomplete    RADIOLOGY STUDIES/RESULTS: US Abdomen Limited RUQ (LIVER/GB)  Result Date: 12/13/2022 CLINICAL DATA:  Abdominal pain EXAM: ULTRASOUND ABDOMEN LIMITED RIGHT UPPER QUADRANT COMPARISON:  CT abdomen and pelvis without contrast November 23, 2022, abdominal ultrasound August 01, 2021 FINDINGS: Gallbladder: There is layering sludge within the gallbladder. No gallstones visualized. No gallbladder wall thickening or pericholecystic fluid. A positive sonographic Murphy's sign is noted by the sonographer. Common bile duct: Diameter: 5 mm Liver: No focal lesion identified. Within normal limits in parenchymal echogenicity. Portal vein is patent on color Doppler imaging with normal direction of blood flow towards the liver. Other: None. IMPRESSION: Findings are overall equivocal for acute cholecystitis. There is layering sludge in the gallbladder and a positive sonographic Murphy's sign is noted by the sonographer. However, there is no gallbladder wall thickening or pericholecystic fluid. If there is persistent clinical concern, recommend further assessment with CT abdomen and pelvis or HIDA scan. Electronically Signed   By: Jacob Moores M.D.   On: 12/13/2022 15:14   DG Chest 2  View  Result Date: 12/13/2022 CLINICAL DATA:  Chest pain. EXAM: CHEST - 2 VIEW COMPARISON:  November 23, 2022. FINDINGS: The heart size and mediastinal contours are within normal limits. Both lungs are clear. The visualized skeletal structures are unremarkable. IMPRESSION: No active cardiopulmonary disease. Electronically Signed   By: Lupita Raider M.D.   On: 12/13/2022 12:16     LOS: 1 day   Jeoffrey Massed, MD  [facility]    To contact the  attending provider between 7A-7P or the covering provider during after hours 7P-7A, please log into the web site [email or web address] and access using universal [place] password for that web site. If you do not have the password, please call the hospital operator.  12/14/2022, 8:16 AM

## 2022-12-14 NOTE — Discharge Instructions (Signed)
[facility] ° °Please arrive at least 30 min before your appointment to complete your check in paperwork.  If you are unable to arrive 30 min prior to your appointment time we may have to cancel or reschedule you. °LAPAROSCOPIC SURGERY: POST OP INSTRUCTIONS °Always review your discharge instruction sheet given to you by the facility where your surgery was performed. °IF YOU HAVE DISABILITY OR FAMILY LEAVE FORMS, YOU MUST BRING THEM TO THE OFFICE FOR PROCESSING.   °DO NOT GIVE THEM TO YOUR DOCTOR. ° °PAIN CONTROL ° °First take acetaminophen (Tylenol) AND/or ibuprofen (Advil) to control your pain after surgery.  Follow directions on package.  Taking acetaminophen (Tylenol) and/or ibuprofen (Advil) regularly after surgery will help to control your pain and lower the amount of prescription pain medication you may need.  You should not take more than 4,000 mg (4 grams) of acetaminophen (Tylenol) in 24 hours.  You should not take ibuprofen (Advil), aleve, motrin, naprosyn or other NSAIDS if you have a history of stomach ulcers or chronic kidney disease.  °A prescription for pain medication may be given to you upon discharge.  Take your pain medication as prescribed, if you still have uncontrolled pain after taking acetaminophen (Tylenol) or ibuprofen (Advil). °Use ice packs to help control pain. °If you need a refill on your pain medication, please contact your pharmacy.  They will contact our office to request authorization. Prescriptions will not be filled after 5pm or on week-ends. ° °HOME MEDICATIONS °Take your usually prescribed medications unless otherwise directed. ° °DIET °You should follow a light diet the first few days after arrival home.  Be sure to include lots of fluids daily. Avoid fatty, fried foods.  ° °CONSTIPATION °It is common to experience some constipation after surgery and if you are taking pain medication.  Increasing fluid intake and taking a stool softener (such as Colace)  will usually help or prevent this problem from occurring.  A mild laxative (Milk of Magnesia or Miralax) should be taken according to package instructions if there are no bowel movements after 48 hours. ° °WOUND/INCISION CARE °Most patients will experience some swelling and bruising in the area of the incisions.  Ice packs will help.  Swelling and bruising can take several days to resolve.  °Unless discharge instructions indicate otherwise, follow guidelines below  °STERI-STRIPS - you may remove your outer bandages 48 hours after surgery, and you may shower at that time.  You have steri-strips (small skin tapes) in place directly over the incision.  These strips should be left on the skin for 7-10 days.   °DERMABOND/SKIN GLUE - you may shower in 24 hours.  The glue will flake off over the next 2-3 weeks. °Any sutures or staples will be removed at the office during your follow-up visit. ° °ACTIVITIES °You may resume regular (light) daily activities beginning the next day--such as daily self-care, walking, climbing stairs--gradually increasing activities as tolerated.  You may have sexual intercourse when it is comfortable.  Refrain from any heavy lifting or straining until approved by your doctor. °You may drive when you are no longer taking prescription pain medication, you can comfortably wear a seatbelt, and you can safely maneuver your car and apply brakes. ° °FOLLOW-UP °You should see your doctor in the office for a follow-up appointment approximately 2-3 weeks after your surgery.  You should have been given your post-op/follow-up appointment when your surgery was scheduled.  If you did not receive a post-op/follow-up appointment, make sure   that you call for this appointment within a day or two after you arrive home to insure a convenient appointment time. ° ° °WHEN TO CALL YOUR DOCTOR: °Fever over 101.0 °Inability to urinate °Continued bleeding from incision. °Increased pain, redness, or drainage from the  incision. °Increasing abdominal pain ° °The clinic staff is available to answer your questions during regular business hours.  Please don’t hesitate to call and ask to speak to one of the nurses for clinical concerns.  If you have a medical emergency, go to the nearest emergency room or call 911.  A surgeon from [facility] is always on call at the hospital. °[address], [address], [place], [place]  [address] ? [address] [address], [place], [place]   [address] °[phone] ? [phone] ? FAX [phone] ° ° ° ° °Managing Your Pain After Surgery Without Opioids ° ° ° °Thank you for participating in our program to help patients manage their pain after surgery without opioids. This is part of our effort to provide you with the best care possible, without exposing you or your family to the risk that opioids pose. ° °What pain can I expect after surgery? °You can expect to have some pain after surgery. This is normal. The pain is typically worse the day after surgery, and quickly begins to get better. °Many studies have found that many patients are able to manage their pain after surgery with Over-the-Counter (OTC) medications such as Tylenol and Motrin. If you have a condition that does not allow you to take Tylenol or Motrin, notify your surgical team. ° °How will I manage my pain? °The best strategy for controlling your pain after surgery is around the clock pain control with Tylenol (acetaminophen) and Motrin (ibuprofen or Advil). Alternating these medications with each other allows you to maximize your pain control. In addition to Tylenol and Motrin, you can use heating pads or ice packs on your incisions to help reduce your pain. ° °How will I alternate your regular strength over-the-counter pain medication? °You will take a dose of pain medication every three hours. °Start by taking 650 mg of Tylenol (2 pills of 325 mg) °3 hours later take 600 mg of Motrin (3 pills of 200 mg) °3 hours after  taking the Motrin take 650 mg of Tylenol °3 hours after that take 600 mg of Motrin. ° ° °- 1 - ° °See example - if your first dose of Tylenol is at 12:00 PM ° ° °12:00 PM Tylenol 650 mg (2 pills of 325 mg)  °3:00 PM Motrin 600 mg (3 pills of 200 mg)  °6:00 PM Tylenol 650 mg (2 pills of 325 mg)  °9:00 PM Motrin 600 mg (3 pills of 200 mg)  °Continue alternating every 3 hours  ° °We recommend that you follow this schedule around-the-clock for at least 3 days after surgery, or until you feel that it is no longer needed. Use the table on the last page of this handout to keep track of the medications you are taking. °Important: °Do not take more than 3000mg of Tylenol or 3200mg of Motrin in a 24-hour period. °Do not take ibuprofen/Motrin if you have a history of bleeding stomach ulcers, severe kidney disease, &/or actively taking a blood thinner ° °What if I still have pain? °If you have pain that is not controlled with the over-the-counter pain medications (Tylenol and Motrin or Advil) you might have what we call “breakthrough” pain. You will receive a prescription   for a small amount of an opioid pain medication such as Oxycodone, Tramadol, or Tylenol with Codeine. Use these opioid pills in the first 24 hours after surgery if you have breakthrough pain. Do not take more than 1 pill every 4-6 hours. ° °If you still have uncontrolled pain after using all opioid pills, don't hesitate to call our staff using the number provided. We will help make sure you are managing your pain in the best way possible, and if necessary, we can provide a prescription for additional pain medication. ° ° °Day 1   ° °Time  °Name of Medication Number of pills taken  °Amount of Acetaminophen  °Pain Level  ° °Comments  °AM PM       °AM PM       °AM PM       °AM PM       °AM PM       °AM PM       °AM PM       °AM PM       °Total Daily amount of Acetaminophen °Do not take more than  3,000 mg per day    ° ° °Day 2   ° °Time  °Name of Medication  Number of pills °taken  °Amount of Acetaminophen  °Pain Level  ° °Comments  °AM PM       °AM PM       °AM PM       °AM PM       °AM PM       °AM PM       °AM PM       °AM PM       °Total Daily amount of Acetaminophen °Do not take more than  3,000 mg per day    ° ° °Day 3   ° °Time  °Name of Medication Number of pills taken  °Amount of Acetaminophen  °Pain Level  ° °Comments  °AM PM       °AM PM       °AM PM       °AM PM       ° ° ° °AM PM       °AM PM       °AM PM       °AM PM       °Total Daily amount of Acetaminophen °Do not take more than  3,000 mg per day    ° ° °Day 4   ° °Time  °Name of Medication Number of pills taken  °Amount of Acetaminophen  °Pain Level  ° °Comments  °AM PM       °AM PM       °AM PM       °AM PM       °AM PM       °AM PM       °AM PM       °AM PM       °Total Daily amount of Acetaminophen °Do not take more than  3,000 mg per day    ° ° °Day 5   ° °Time  °Name of Medication Number °of pills taken  °Amount of Acetaminophen  °Pain Level  ° °Comments  °AM PM       °AM PM       °AM PM       °AM PM       °AM PM       °AM   PM       °AM PM       °AM PM       °Total Daily amount of Acetaminophen °Do not take more than  3,000 mg per day    ° ° ° °Day 6   ° °Time  °Name of Medication Number of pills °taken  °Amount of Acetaminophen  °Pain Level  °Comments  °AM PM       °AM PM       °AM PM       °AM PM       °AM PM       °AM PM       °AM PM       °AM PM       °Total Daily amount of Acetaminophen °Do not take more than  3,000 mg per day    ° ° °Day 7   ° °Time  °Name of Medication Number of pills taken  °Amount of Acetaminophen  °Pain Level  ° °Comments  °AM PM       °AM PM       °AM PM       °AM PM       °AM PM       °AM PM       °AM PM       °AM PM       °Total Daily amount of Acetaminophen °Do not take more than  3,000 mg per day    ° ° ° ° °For additional information about how and where to safely dispose of unused opioid °medications - https://www.morepowerful[employer].org ° °Disclaimer: This document  contains information and/or instructional materials adapted from [facility] for the typical patient with your condition. It does not replace medical advice from your health care provider because your experience may differ from that of the °typical patient. Talk to your health care provider if you have any questions about this °document, your condition or your treatment plan. °Adapted from [facility] ° °

## 2022-12-15 ENCOUNTER — Encounter (HOSPITAL_COMMUNITY): Payer: Self-pay | Admitting: Surgery

## 2022-12-15 ENCOUNTER — Observation Stay (HOSPITAL_COMMUNITY): Payer: 59

## 2022-12-15 DIAGNOSIS — R112 Nausea with vomiting, unspecified: Secondary | ICD-10-CM | POA: Diagnosis not present

## 2022-12-15 DIAGNOSIS — K819 Cholecystitis, unspecified: Secondary | ICD-10-CM | POA: Diagnosis not present

## 2022-12-15 DIAGNOSIS — R1013 Epigastric pain: Secondary | ICD-10-CM | POA: Diagnosis not present

## 2022-12-15 DIAGNOSIS — R55 Syncope and collapse: Secondary | ICD-10-CM | POA: Diagnosis not present

## 2022-12-15 LAB — COMPREHENSIVE METABOLIC PANEL
ALT: 33 U/L (ref 0–44)
AST: 29 U/L (ref 15–41)
Albumin: 2.6 g/dL — ABNORMAL LOW (ref 3.5–5.0)
Alkaline Phosphatase: 58 U/L (ref 38–126)
Anion gap: 6 (ref 5–15)
BUN: 10 mg/dL (ref 6–20)
CO2: 25 mmol/L (ref 22–32)
Calcium: 8 mg/dL — ABNORMAL LOW (ref 8.9–10.3)
Chloride: 105 mmol/L (ref 98–111)
Creatinine, Ser: 0.92 mg/dL (ref 0.61–1.24)
GFR, Estimated: >60 mL/min (ref >60)
Glucose, Bld: 154 mg/dL — ABNORMAL HIGH (ref 70–99)
Potassium: 4.3 mmol/L (ref 3.5–5.1)
Sodium: 136 mmol/L (ref 135–145)
Total Bilirubin: 0.8 mg/dL (ref 0.3–1.2)
Total Protein: 6 g/dL — ABNORMAL LOW (ref 6.5–8.1)

## 2022-12-15 LAB — CBC
HCT: 37.9 % — ABNORMAL LOW (ref 39.0–52.0)
Hemoglobin: 12.3 g/dL — ABNORMAL LOW (ref 13.0–17.0)
MCH: 28.2 pg (ref 26.0–34.0)
MCHC: 32.5 g/dL (ref 30.0–36.0)
MCV: 86.9 fL (ref 80.0–100.0)
Platelets: 220 10*3/uL (ref 150–400)
RBC: 4.36 MIL/uL (ref 4.22–5.81)
RDW: 13.3 % (ref 11.5–15.5)
WBC: 9.2 10*3/uL (ref 4.0–10.5)
nRBC: 0 % (ref 0.0–0.2)

## 2022-12-15 LAB — CULTURE, BLOOD (ROUTINE X 2)

## 2022-12-15 MED ORDER — OXYCODONE HCL 5 MG PO TABS: 5.0000 mg | ORAL_TABLET | ORAL | Status: DC | PRN

## 2022-12-15 MED ORDER — ACETAMINOPHEN 500 MG PO TABS: 1000.0000 mg | ORAL_TABLET | Freq: Four times a day (QID) | ORAL | Status: AC | PRN

## 2022-12-15 MED ORDER — PANTOPRAZOLE SODIUM 40 MG PO TBEC
40.0000 mg | DELAYED_RELEASE_TABLET | Freq: Every day | ORAL | 0 refills | Status: DC
Start: 2022-12-15 — End: 2024-05-25

## 2022-12-15 MED ORDER — IBUPROFEN 200 MG PO TABS: 600.0000 mg | ORAL_TABLET | Freq: Three times a day (TID) | ORAL | 2 refills | Status: AC | PRN

## 2022-12-15 MED ORDER — OXYCODONE HCL 5 MG PO TABS: 5.0000 mg | ORAL_TABLET | ORAL | 0 refills | Status: DC | PRN

## 2022-12-15 NOTE — Progress Notes (Signed)
Discharge instructions reviewed with pt and his wife.  Copy of instructions given to pt. Informed script sent to his pharmacy for pick up. Both verbalized understanding of instructions and able to teach back.  Wife needs a letter for being out of work while here at the hospital with pt and for rest of week to care for pt, reached out to surgery PA and medical MD for this.   Pt to d/c'd via wheelchair with belongings, with wife.          To be escorted by hospital volunteer.

## 2022-12-15 NOTE — Discharge Summary (Signed)
PATIENT DETAILS Name: Jared Park. Age: 57 y.o. Sex: male Date of Birth: 1966-05-27 MRN: [id number]. Admitting Physician: Clydie Braun, MD [id number], Talmadge Coventry, MD  Admit Date: 12/13/2022 Discharge date: 12/15/2022  Recommendations for Outpatient Follow-up:  Follow up with PCP in 1-2 weeks Please obtain CMP/CBC in one week  Admitted From:  Home  Disposition: Home   Discharge Condition: good  CODE STATUS:   Code Status: Full Code   Diet recommendation:  Diet Order             Diet - low sodium heart healthy           DIET SOFT Room service appropriate? Yes; Fluid consistency: Thin  Diet effective now                    Brief Summary: Patient is a 57 y.o.  male with history of GERD-presented to the ED with frequent/colicky sounding epigastric/upper abdominal pain associated with fever.  While in the ED he sustained a syncopal episode.  See below for further details.   Significant events: 4/14>> admit to [facility]   Significant studies: 4/14>> RUQ ultrasound: Layering sludge-positive sonographicMurphy sign   Significant microbiology data: 4/14>> COVID/influenza/RSV PCR: Negative 4/14>> blood culture: Negative   Procedures: None   Consults: Gen surgery  Brief Hospital Course: Syncope Suspected vasovagal etiology Telemetry overnight unremarkable Echo contemplated but not yet done-since he has no major cardiac issues-this can be pursued in the outpatient setting.   Acute calculus cholecystitis Evaluated manage general surgery-underwent laparoscopic cholecystectomy on 4/15 Uneventful postoperative course Seen by general surgery-tolerating diet-okay for discharge today.     GERD EGD January 2024 was unremarkable PPI   HLD Statin    BMI: Estimated body mass index is 27.29 kg/m as calculated from the following:   Height as of this encounter: 5\' 6"  (1.676 m).   Weight as of this encounter: 76.7 kg.    Discharge Diagnoses:   Principal Problem:   Syncope Active Problems:   Fever   Cholecystitis   Nausea & vomiting   Epigastric abdominal pain   Hypokalemia   Total bilirubin, elevated   GERD (gastroesophageal reflux disease)   Discharge Instructions:  Activity:  As tolerated  Discharge Instructions     Call MD for:  redness, tenderness, or signs of infection (pain, swelling, redness, odor or green/yellow discharge around incision site)   Complete by: As directed    Call MD for:  severe uncontrolled pain   Complete by: As directed    Diet - low sodium heart healthy   Complete by: As directed    Discharge instructions   Complete by: As directed    Follow with Primary MD  Mliss Sax, MD in 1-2 weeks  Please follow-up with general surgery as instructed  Please ask your primary care practitioner to do a 2D echocardiogram of your heart because you sustained a fainting episode while in the emergency room.  Please get a complete blood count and chemistry panel checked by your Primary MD at your next visit, and again as instructed by your Primary MD.  Get Medicines reviewed and adjusted: Please take all your medications with you for your next visit with your Primary MD  Laboratory/radiological data: Please request your Primary MD to go over all hospital tests and procedure/radiological results at the follow up, please ask your Primary MD to get all Hospital records sent to his/her office.  In some cases, they will be blood work,  cultures and biopsy results pending at the time of your discharge. Please request that your primary care M.D. follows up on these results.  Also Note the following: If you experience worsening of your admission symptoms, develop shortness of breath, life threatening emergency, suicidal or homicidal thoughts you must seek medical attention immediately by calling 911 or calling your MD immediately  if symptoms less severe.  You must read complete  instructions/literature along with all the possible adverse reactions/side effects for all the Medicines you take and that have been prescribed to you. Take any new Medicines after you have completely understood and accpet all the possible adverse reactions/side effects.   Do not drive when taking Pain medications or sleeping medications (Benzodaizepines)  Do not take more than prescribed Pain, Sleep and Anxiety Medications. It is not advisable to combine anxiety,sleep and pain medications without talking with your primary care practitioner  Special Instructions: If you have smoked or chewed Tobacco  in the last 2 yrs please stop smoking, stop any regular Alcohol  and or any Recreational drug use.  Wear Seat belts while driving.  Please note: You were cared for by a hospitalist during your hospital stay. Once you are discharged, your primary care physician will handle any further medical issues. Please note that NO REFILLS for any discharge medications will be authorized once you are discharged, as it is imperative that you return to your primary care physician (or establish a relationship with a primary care physician if you do not have one) for your post hospital discharge needs so that they can reassess your need for medications and monitor your lab values.   Increase activity slowly   Complete by: As directed       Allergies as of 12/15/2022   No Known Allergies      Medication List     STOP taking these medications    predniSONE 10 MG (21) Tbpk tablet Commonly known as: STERAPRED UNI-PAK 21 TAB   sucralfate 1 g tablet Commonly known as: Carafate   tadalafil 20 MG tablet Commonly known as: CIALIS   traMADol 50 MG tablet Commonly known as: ULTRAM       TAKE these medications    acetaminophen 500 MG tablet Commonly known as: TYLENOL Take 2 tablets (1,000 mg total) by mouth every 6 (six) hours as needed for mild pain (or Fever >/= 101).   atorvastatin 20 MG  tablet Commonly known as: LIPITOR Take 1 tablet (20 mg total) by mouth daily.   ibuprofen 200 MG tablet Commonly known as: Motrin IB Take 3 tablets (600 mg total) by mouth every 8 (eight) hours as needed.   ondansetron 4 MG disintegrating tablet Commonly known as: ZOFRAN-ODT Take 1 tablet (4 mg total) by mouth every 8 (eight) hours as needed for nausea or vomiting.   oxyCODONE 5 MG immediate release tablet Commonly known as: Oxy IR/ROXICODONE Take 1 tablet (5 mg total) by mouth every 4 (four) hours as needed for moderate pain.   pantoprazole 40 MG tablet Commonly known as: PROTONIX Take 1 tablet (40 mg total) by mouth daily. What changed: See the new instructions.        Follow-up Information     Maczis, Hedda Slade, PA-C Follow up on 01/05/2023.   Specialty: General Surgery Why: 11 am, Arrive 30 minutes prior to your appointment time, Please bring your insurance card and photo ID Contact information: [address] [facility] [place] [address] [phone]  Mliss Sax, MD. Schedule an appointment as soon as possible for a visit in 1 week(s).   Specialty: Family Medicine Contact information: [address] [place] [address] [phone]                No Known Allergies   Other Procedures/Studies: CT ABDOMEN PELVIS W CONTRAST  Result Date: 12/14/2022 CLINICAL DATA:  Abdominal pain. EXAM: CT ABDOMEN AND PELVIS WITH CONTRAST TECHNIQUE: Multidetector CT imaging of the abdomen and pelvis was performed using the standard protocol following bolus administration of intravenous contrast. RADIATION DOSE REDUCTION: This exam was performed according to the departmental dose-optimization program which includes automated exposure control, adjustment of the mA and/or kV according to patient size and/or use of iterative reconstruction technique. CONTRAST:  75mL OMNIPAQUE IOHEXOL 350 MG/ML SOLN COMPARISON:   11/23/2022. FINDINGS: Lower chest: No acute abnormality. Hepatobiliary: Normal liver. Gallbladder wall mildly thickened, 3 to 4 mm. No visible gallstone. No pericholecystic inflammation. No bile duct dilation. Pancreas: Unremarkable. No pancreatic ductal dilatation or surrounding inflammatory changes. Spleen: Normal in size without focal abnormality. Adrenals/Urinary Tract: Adrenal glands are unremarkable. Kidneys are normal, without renal calculi, focal lesion, or hydronephrosis. Bladder is unremarkable. Stomach/Bowel: Stomach is within normal limits. Appendix appears normal. No evidence of bowel wall thickening, distention, or inflammatory changes. Vascular/Lymphatic: No significant vascular findings are present. No enlarged abdominal or pelvic lymph nodes. Reproductive: Unremarkable. Other: No abdominal wall hernia or abnormality. No abdominopelvic ascites. Musculoskeletal: No fracture or acute finding.  No bone lesion. IMPRESSION: 1. Borderline to mild gallbladder wall thickening. Consider early acute cholecystitis in the proper clinical setting. This would be best further assessed with limited right upper quadrant ultrasound. 2. No other abnormalities. No other findings to account for abdominal pain. Electronically Signed   By: Amie Portland M.D.   On: 12/14/2022 09:07   US Abdomen Limited RUQ (LIVER/GB)  Result Date: 12/13/2022 CLINICAL DATA:  Abdominal pain EXAM: ULTRASOUND ABDOMEN LIMITED RIGHT UPPER QUADRANT COMPARISON:  CT abdomen and pelvis without contrast November 23, 2022, abdominal ultrasound August 01, 2021 FINDINGS: Gallbladder: There is layering sludge within the gallbladder. No gallstones visualized. No gallbladder wall thickening or pericholecystic fluid. A positive sonographic Murphy's sign is noted by the sonographer. Common bile duct: Diameter: 5 mm Liver: No focal lesion identified. Within normal limits in parenchymal echogenicity. Portal vein is patent on color Doppler imaging with normal  direction of blood flow towards the liver. Other: None. IMPRESSION: Findings are overall equivocal for acute cholecystitis. There is layering sludge in the gallbladder and a positive sonographic Murphy's sign is noted by the sonographer. However, there is no gallbladder wall thickening or pericholecystic fluid. If there is persistent clinical concern, recommend further assessment with CT abdomen and pelvis or HIDA scan. Electronically Signed   By: Jacob Moores M.D.   On: 12/13/2022 15:14   DG Chest 2 View  Result Date: 12/13/2022 CLINICAL DATA:  Chest pain. EXAM: CHEST - 2 VIEW COMPARISON:  November 23, 2022. FINDINGS: The heart size and mediastinal contours are within normal limits. Both lungs are clear. The visualized skeletal structures are unremarkable. IMPRESSION: No active cardiopulmonary disease. Electronically Signed   By: Lupita Raider M.D.   On: 12/13/2022 12:16   CT ABDOMEN PELVIS WO CONTRAST  Result Date: 11/23/2022 CLINICAL DATA:  Epigastric abdominal pain EXAM: CT ABDOMEN AND PELVIS WITHOUT CONTRAST TECHNIQUE: Multidetector CT imaging of the abdomen and pelvis was performed following the standard protocol without IV contrast. RADIATION DOSE REDUCTION: This exam  was performed according to the departmental dose-optimization program which includes automated exposure control, adjustment of the mA and/or kV according to patient size and/or use of iterative reconstruction technique. COMPARISON:  None Available. FINDINGS: Lower chest: No acute abnormality. Hepatobiliary: No solid liver abnormality is seen. No gallstones, gallbladder wall thickening, or biliary dilatation. Pancreas: Unremarkable. No pancreatic ductal dilatation or surrounding inflammatory changes. Spleen: Normal in size without significant abnormality. Adrenals/Urinary Tract: Adrenal glands are unremarkable. Kidneys are normal, without renal calculi, solid lesion, or hydronephrosis. Bladder is unremarkable. Stomach/Bowel: Stomach is  within normal limits. Appendix appears normal. No evidence of bowel wall thickening, distention, or inflammatory changes. Vascular/Lymphatic: No significant vascular findings are present. No enlarged abdominal or pelvic lymph nodes. Reproductive: No mass or other significant abnormality. Other: No abdominal wall hernia or abnormality. No ascites. Musculoskeletal: No acute or significant osseous findings. IMPRESSION: No acute noncontrast CT findings of the abdomen or pelvis to explain epigastric pain. Electronically Signed   By: Jearld Lesch M.D.   On: 11/23/2022 15:26   DG Chest 2 View  Result Date: 11/23/2022 CLINICAL DATA:  Chest pain EXAM: CHEST - 2 VIEW COMPARISON:  02/18/2022 FINDINGS: Transverse diameter of heart is slightly increased. There are no signs of pulmonary edema or focal pulmonary consolidation. There is no pleural effusion or pneumothorax. IMPRESSION: There are no signs of pulmonary edema or focal pulmonary consolidation. Electronically Signed   By: Ernie Avena M.D.   On: 11/23/2022 13:09     TODAY-DAY OF DISCHARGE:  Subjective:   Darral Dash today has no headache,no chest abdominal pain,no new weakness tingling or numbness, feels much better wants to go home today.   Objective:   Blood pressure 129/75, pulse 75, temperature 98 F (36.7 C), temperature source Oral, resp. rate 18, height  (1.676 m), weight 76.7 kg, SpO2 97 %.  Intake/Output Summary (Last 24 hours) at 12/15/2022 0848 Last data filed at 12/14/2022 2025 Gross per 24 hour  Intake 700 ml  Output 10 ml  Net 690 ml   Filed Weights   12/13/22 1131  Weight: 76.7 kg    Exam: Awake Alert, Oriented *3, No new F.N deficits, Normal affect [place].AT,PERRAL Supple Neck,No JVD, No cervical lymphadenopathy appriciated.  Symmetrical Chest wall movement, Good air movement bilaterally, CTAB RRR,No Gallops,Rubs or new Murmurs, No Parasternal Heave +ve B.Sounds, Abd Soft, Non tender, No organomegaly appriciated,  No rebound -guarding or rigidity. No Cyanosis, Clubbing or edema, No new Rash or bruise   PERTINENT RADIOLOGIC STUDIES: CT ABDOMEN PELVIS W CONTRAST  Result Date: 12/14/2022 CLINICAL DATA:  Abdominal pain. EXAM: CT ABDOMEN AND PELVIS WITH CONTRAST TECHNIQUE: Multidetector CT imaging of the abdomen and pelvis was performed using the standard protocol following bolus administration of intravenous contrast. RADIATION DOSE REDUCTION: This exam was performed according to the departmental dose-optimization program which includes automated exposure control, adjustment of the mA and/or kV according to patient size and/or use of iterative reconstruction technique. CONTRAST:  75mL OMNIPAQUE IOHEXOL 350 MG/ML SOLN COMPARISON:  11/23/2022. FINDINGS: Lower chest: No acute abnormality. Hepatobiliary: Normal liver. Gallbladder wall mildly thickened, 3 to 4 mm. No visible gallstone. No pericholecystic inflammation. No bile duct dilation. Pancreas: Unremarkable. No pancreatic ductal dilatation or surrounding inflammatory changes. Spleen: Normal in size without focal abnormality. Adrenals/Urinary Tract: Adrenal glands are unremarkable. Kidneys are normal, without renal calculi, focal lesion, or hydronephrosis. Bladder is unremarkable. Stomach/Bowel: Stomach is within normal limits. Appendix appears normal. No evidence of bowel wall thickening, distention, or inflammatory changes. Vascular/Lymphatic: No  significant vascular findings are present. No enlarged abdominal or pelvic lymph nodes. Reproductive: Unremarkable. Other: No abdominal wall hernia or abnormality. No abdominopelvic ascites. Musculoskeletal: No fracture or acute finding.  No bone lesion. IMPRESSION: 1. Borderline to mild gallbladder wall thickening. Consider early acute cholecystitis in the proper clinical setting. This would be best further assessed with limited right upper quadrant ultrasound. 2. No other abnormalities. No other findings to account for  abdominal pain. Electronically Signed   By: Amie Portland M.D.   On: 12/14/2022 09:07   US Abdomen Limited RUQ (LIVER/GB)  Result Date: 12/13/2022 CLINICAL DATA:  Abdominal pain EXAM: ULTRASOUND ABDOMEN LIMITED RIGHT UPPER QUADRANT COMPARISON:  CT abdomen and pelvis without contrast November 23, 2022, abdominal ultrasound August 01, 2021 FINDINGS: Gallbladder: There is layering sludge within the gallbladder. No gallstones visualized. No gallbladder wall thickening or pericholecystic fluid. A positive sonographic Murphy's sign is noted by the sonographer. Common bile duct: Diameter: 5 mm Liver: No focal lesion identified. Within normal limits in parenchymal echogenicity. Portal vein is patent on color Doppler imaging with normal direction of blood flow towards the liver. Other: None. IMPRESSION: Findings are overall equivocal for acute cholecystitis. There is layering sludge in the gallbladder and a positive sonographic Murphy's sign is noted by the sonographer. However, there is no gallbladder wall thickening or pericholecystic fluid. If there is persistent clinical concern, recommend further assessment with CT abdomen and pelvis or HIDA scan. Electronically Signed   By: Jacob Moores M.D.   On: 12/13/2022 15:14   DG Chest 2 View  Result Date: 12/13/2022 CLINICAL DATA:  Chest pain. EXAM: CHEST - 2 VIEW COMPARISON:  November 23, 2022. FINDINGS: The heart size and mediastinal contours are within normal limits. Both lungs are clear. The visualized skeletal structures are unremarkable. IMPRESSION: No active cardiopulmonary disease. Electronically Signed   By: Lupita Raider M.D.   On: 12/13/2022 12:16     PERTINENT LAB RESULTS: CBC: Recent Labs    12/14/22 0456 12/15/22 0332  WBC 7.8 9.2  HGB 13.0 12.3*  HCT 39.6 37.9*  PLT 261 220   CMET CMP     Component Value Date/Time   NA 136 12/15/2022 0332   NA 140 02/23/2018 1203   K 4.3 12/15/2022 0332   CL 105 12/15/2022 0332   CO2 25 12/15/2022  0332   GLUCOSE 154 (H) 12/15/2022 0332   BUN 10 12/15/2022 0332   BUN 10 02/23/2018 1203   CREATININE 0.92 12/15/2022 0332   CALCIUM 8.0 (L) 12/15/2022 0332   PROT 6.0 (L) 12/15/2022 0332   PROT 6.7 02/23/2018 1203   ALBUMIN 2.6 (L) 12/15/2022 0332   ALBUMIN 4.2 02/23/2018 1203   AST 29 12/15/2022 0332   ALT 33 12/15/2022 0332   ALKPHOS 58 12/15/2022 0332   BILITOT 0.8 12/15/2022 0332   BILITOT 0.4 02/23/2018 1203   GFRNONAA >60 12/15/2022 0332   GFRAA >60 04/21/2020 0915    GFR Estimated Creatinine Clearance: 89.6 mL/min (by C-G formula based on SCr of 0.92 mg/dL). Recent Labs    12/13/22 1806  LIPASE 48   No results for input(s): "CKTOTAL", "CKMB", "CKMBINDEX", "TROPONINI" in the last 72 hours. Invalid input(s): "POCBNP" No results for input(s): "DDIMER" in the last 72 hours. No results for input(s): "HGBA1C" in the last 72 hours. No results for input(s): "CHOL", "HDL", "LDLCALC", "TRIG", "CHOLHDL", "LDLDIRECT" in the last 72 hours. Recent Labs    12/14/22 0456  TSH 0.422   No results for input(s): "VITAMINB12", "  FOLATE", "FERRITIN", "TIBC", "IRON", "RETICCTPCT" in the last 72 hours. Coags: No results for input(s): "INR" in the last 72 hours.  Invalid input(s): "PT" Microbiology: Recent Results (from the past 240 hour(s))  Resp panel by RT-PCR (RSV, Flu A&B, Covid) Anterior Nasal Swab     Status: None   Collection Time: 12/13/22 12:05 PM   Specimen: Anterior Nasal Swab  Result Value Ref Range Status   SARS Coronavirus 2 by RT PCR NEGATIVE NEGATIVE Final   Influenza A by PCR NEGATIVE NEGATIVE Final   Influenza B by PCR NEGATIVE NEGATIVE Final    Comment: (NOTE) The Xpert Xpress SARS-CoV-2/FLU/RSV plus assay is intended as an aid in the diagnosis of influenza from Nasopharyngeal swab specimens and should not be used as a sole basis for treatment. Nasal washings and aspirates are unacceptable for Xpert Xpress SARS-CoV-2/FLU/RSV testing.  Fact Sheet for  Patients: BloggerCourse.com  Fact Sheet for Healthcare Providers: SeriousBroker.it  This test is not yet approved or cleared by the [place] FDA and has been authorized for detection and/or diagnosis of SARS-CoV-2 by FDA under an Emergency Use Authorization (EUA). This EUA will remain in effect (meaning this test can be used) for the duration of the COVID-19 declaration under Section 564(b)(1) of the Act, 21 U.S.C. section 360bbb-3(b)(1), unless the authorization is terminated or revoked.     Resp Syncytial Virus by PCR NEGATIVE NEGATIVE Final    Comment: (NOTE) Fact Sheet for Patients: BloggerCourse.com  Fact Sheet for Healthcare Providers: SeriousBroker.it  This test is not yet approved or cleared by the [place] FDA and has been authorized for detection and/or diagnosis of SARS-CoV-2 by FDA under an Emergency Use Authorization (EUA). This EUA will remain in effect (meaning this test can be used) for the duration of the COVID-19 declaration under Section 564(b)(1) of the Act, 21 U.S.C. section 360bbb-3(b)(1), unless the authorization is terminated or revoked.  Performed at [facility], [address], [place], [place] [address]   Respiratory (~20 pathogens) panel by PCR     Status: Abnormal   Collection Time: 12/13/22 12:05 PM   Specimen: Nasopharyngeal Swab; Respiratory  Result Value Ref Range Status   Adenovirus NOT DETECTED NOT DETECTED Final   Coronavirus 229E NOT DETECTED NOT DETECTED Final    Comment: (NOTE) The Coronavirus on the Respiratory Panel, DOES NOT test for the novel  Coronavirus (2019 nCoV)    Coronavirus HKU1 NOT DETECTED NOT DETECTED Final   Coronavirus NL63 NOT DETECTED NOT DETECTED Final   Coronavirus OC43 NOT DETECTED NOT DETECTED Final   Metapneumovirus NOT DETECTED NOT DETECTED Final   Rhinovirus / Enterovirus NOT DETECTED NOT  DETECTED Final   Influenza A NOT DETECTED NOT DETECTED Final   Influenza B NOT DETECTED NOT DETECTED Final   Parainfluenza Virus 1 NOT DETECTED NOT DETECTED Final   Parainfluenza Virus 2 NOT DETECTED NOT DETECTED Final   Parainfluenza Virus 3 DETECTED (A) NOT DETECTED Final   Parainfluenza Virus 4 NOT DETECTED NOT DETECTED Final   Respiratory Syncytial Virus NOT DETECTED NOT DETECTED Final   Bordetella pertussis NOT DETECTED NOT DETECTED Final   Bordetella Parapertussis NOT DETECTED NOT DETECTED Final   Chlamydophila pneumoniae NOT DETECTED NOT DETECTED Final   Mycoplasma pneumoniae NOT DETECTED NOT DETECTED Final    Comment: Performed at [facility], [address], [place], [place] [address]  Blood culture (routine x 2)     Status: None (Preliminary result)   Collection Time: 12/13/22  6:07 PM  Specimen: BLOOD RIGHT ARM  Result Value Ref Range Status   Specimen Description BLOOD RIGHT ARM  Final   Special Requests   Final    BOTTLES DRAWN AEROBIC ONLY Blood Culture adequate volume   Culture   Final    NO GROWTH 2 DAYS Performed at [facility], [address] [place], [place] [address]    Report Status PENDING  Incomplete  Blood culture (routine x 2)     Status: None (Preliminary result)   Collection Time: 12/13/22  6:07 PM   Specimen: BLOOD RIGHT ARM  Result Value Ref Range Status   Specimen Description BLOOD RIGHT ARM  Final   Special Requests   Final    BOTTLES DRAWN AEROBIC ONLY Blood Culture adequate volume   Culture   Final    NO GROWTH 2 DAYS Performed at [facility], [address], [place], [place] [address]    Report Status PENDING  Incomplete    FURTHER DISCHARGE INSTRUCTIONS:  Get Medicines reviewed and adjusted: Please take all your medications with you for your next visit with your Primary MD  Laboratory/radiological data: Please request your Primary MD to go over all hospital tests and procedure/radiological results at the follow up,  please ask your Primary MD to get all Hospital records sent to his/her office.  In some cases, they will be blood work, cultures and biopsy results pending at the time of your discharge. Please request that your primary care M.D. goes through all the records of your hospital data and follows up on these results.  Also Note the following: If you experience worsening of your admission symptoms, develop shortness of breath, life threatening emergency, suicidal or homicidal thoughts you must seek medical attention immediately by calling 911 or calling your MD immediately  if symptoms less severe.  You must read complete instructions/literature along with all the possible adverse reactions/side effects for all the Medicines you take and that have been prescribed to you. Take any new Medicines after you have completely understood and accpet all the possible adverse reactions/side effects.   Do not drive when taking Pain medications or sleeping medications (Benzodaizepines)  Do not take more than prescribed Pain, Sleep and Anxiety Medications. It is not advisable to combine anxiety,sleep and pain medications without talking with your primary care practitioner  Special Instructions: If you have smoked or chewed Tobacco  in the last 2 yrs please stop smoking, stop any regular Alcohol  and or any Recreational drug use.  Wear Seat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

## 2022-12-15 NOTE — Progress Notes (Signed)
1 Day Post-Op  Subjective: Doing well.  Soreness but pre-op pain has resolved.  Tolerating solid diet with no issues.  Pain controlled with oral meds.  Voiding well.  Objective: Vital signs in last 24 hours: Temp:  [97.6 F (36.4 C)-99.2 F (37.3 C)] 98 F (36.7 C) (04/16 0816) Pulse Rate:  [59-75] 75 (04/16 0816) Resp:  [11-19] 18 (04/16 0816) BP: (104-129)/(62-89) 129/75 (04/16 0816) SpO2:  [93 %-98 %] 97 % (04/16 0409) Last BM Date : 12/12/22  Intake/Output from previous day: 04/15 0701 - 04/16 0700 In: 700 [P.O.:100; I.V.:500; IV Piggyback:100] Out: 10 [Blood:10] Intake/Output this shift: No intake/output data recorded.  PE: Gen: NAD Abd: soft, appropriately tender, +BS, ND, incisions c/d/i  Lab Results:  Recent Labs    12/14/22 0456 12/15/22 0332  WBC 7.8 9.2  HGB 13.0 12.3*  HCT 39.6 37.9*  PLT 261 220   BMET Recent Labs    12/14/22 0456 12/15/22 0332  NA 136 136  K 3.9 4.3  CL 98 105  CO2 27 25  GLUCOSE 101* 154*  BUN 9 10  CREATININE 1.17 0.92  CALCIUM 8.2* 8.0*   PT/INR No results for input(s): "LABPROT", "INR" in the last 72 hours. CMP     Component Value Date/Time   NA 136 12/15/2022 0332   NA 140 02/23/2018 1203   K 4.3 12/15/2022 0332   CL 105 12/15/2022 0332   CO2 25 12/15/2022 0332   GLUCOSE 154 (H) 12/15/2022 0332   BUN 10 12/15/2022 0332   BUN 10 02/23/2018 1203   CREATININE 0.92 12/15/2022 0332   CALCIUM 8.0 (L) 12/15/2022 0332   PROT 6.0 (L) 12/15/2022 0332   PROT 6.7 02/23/2018 1203   ALBUMIN 2.6 (L) 12/15/2022 0332   ALBUMIN 4.2 02/23/2018 1203   AST 29 12/15/2022 0332   ALT 33 12/15/2022 0332   ALKPHOS 58 12/15/2022 0332   BILITOT 0.8 12/15/2022 0332   BILITOT 0.4 02/23/2018 1203   GFRNONAA >60 12/15/2022 0332   GFRAA >60 04/21/2020 0915   Lipase     Component Value Date/Time   LIPASE 48 12/13/2022 1806       Studies/Results: CT ABDOMEN PELVIS W CONTRAST  Result Date: 12/14/2022 CLINICAL DATA:   Abdominal pain. EXAM: CT ABDOMEN AND PELVIS WITH CONTRAST TECHNIQUE: Multidetector CT imaging of the abdomen and pelvis was performed using the standard protocol following bolus administration of intravenous contrast. RADIATION DOSE REDUCTION: This exam was performed according to the departmental dose-optimization program which includes automated exposure control, adjustment of the mA and/or kV according to patient size and/or use of iterative reconstruction technique. CONTRAST:  75mL OMNIPAQUE IOHEXOL 350 MG/ML SOLN COMPARISON:  11/23/2022. FINDINGS: Lower chest: No acute abnormality. Hepatobiliary: Normal liver. Gallbladder wall mildly thickened, 3 to 4 mm. No visible gallstone. No pericholecystic inflammation. No bile duct dilation. Pancreas: Unremarkable. No pancreatic ductal dilatation or surrounding inflammatory changes. Spleen: Normal in size without focal abnormality. Adrenals/Urinary Tract: Adrenal glands are unremarkable. Kidneys are normal, without renal calculi, focal lesion, or hydronephrosis. Bladder is unremarkable. Stomach/Bowel: Stomach is within normal limits. Appendix appears normal. No evidence of bowel wall thickening, distention, or inflammatory changes. Vascular/Lymphatic: No significant vascular findings are present. No enlarged abdominal or pelvic lymph nodes. Reproductive: Unremarkable. Other: No abdominal wall hernia or abnormality. No abdominopelvic ascites. Musculoskeletal: No fracture or acute finding.  No bone lesion. IMPRESSION: 1. Borderline to mild gallbladder wall thickening. Consider early acute cholecystitis in the proper clinical setting. This would be best  further assessed with limited right upper quadrant ultrasound. 2. No other abnormalities. No other findings to account for abdominal pain. Electronically Signed   By: Amie Portland M.D.   On: 12/14/2022 09:07   US Abdomen Limited RUQ (LIVER/GB)  Result Date: 12/13/2022 CLINICAL DATA:  Abdominal pain EXAM: ULTRASOUND  ABDOMEN LIMITED RIGHT UPPER QUADRANT COMPARISON:  CT abdomen and pelvis without contrast November 23, 2022, abdominal ultrasound August 01, 2021 FINDINGS: Gallbladder: There is layering sludge within the gallbladder. No gallstones visualized. No gallbladder wall thickening or pericholecystic fluid. A positive sonographic Murphy's sign is noted by the sonographer. Common bile duct: Diameter: 5 mm Liver: No focal lesion identified. Within normal limits in parenchymal echogenicity. Portal vein is patent on color Doppler imaging with normal direction of blood flow towards the liver. Other: None. IMPRESSION: Findings are overall equivocal for acute cholecystitis. There is layering sludge in the gallbladder and a positive sonographic Murphy's sign is noted by the sonographer. However, there is no gallbladder wall thickening or pericholecystic fluid. If there is persistent clinical concern, recommend further assessment with CT abdomen and pelvis or HIDA scan. Electronically Signed   By: Jacob Moores M.D.   On: 12/13/2022 15:14   DG Chest 2 View  Result Date: 12/13/2022 CLINICAL DATA:  Chest pain. EXAM: CHEST - 2 VIEW COMPARISON:  November 23, 2022. FINDINGS: The heart size and mediastinal contours are within normal limits. Both lungs are clear. The visualized skeletal structures are unremarkable. IMPRESSION: No active cardiopulmonary disease. Electronically Signed   By: Lupita Raider M.D.   On: 12/13/2022 12:16    Anti-infectives: Anti-infectives (From admission, onward)    Start     Dose/Rate Route Frequency Ordered Stop   12/13/22 1545  cefTRIAXone (ROCEPHIN) 2 g in sodium chloride 0.9 % 100 mL IVPB  Status:  Discontinued        2 g 200 mL/hr over 30 Minutes Intravenous Every 24 hours 12/13/22 1534 12/15/22 0826        Assessment/Plan POD 1, s/p lap chole for cholecystitis, Dr. Magnus Ivan 12/14/22 -doing well post op -no further abx therapy needed -may stop carafate and decrease protonix to  daily -follow up arranged and DC instructions d/w patient and his wife -surgically stable for DC home today when medically stable.  Discussed with primary service.  FEN - soft diet VTE - Lovenox ID - Rocephin, DC  GERD   LOS: 1 day    Letha Cape , [facility] Surgery 12/15/2022, 8:29 AM Please see Amion for pager number during day hours 7:00am-4:30pm or 7:00am -11:30am on weekends

## 2022-12-15 NOTE — TOC Transition Note (Signed)
Transition of Care [facility]) - CM/SW Discharge Note   Patient Details  Name: Gregroy Dombkowski. MRN: [id number] Date of Birth: Oct 16, 1965  [facility]) CM/SW Contact:  Gordy Clement, RN Phone Number: 12/15/2022, 9:52 AM   Clinical Narrative:     Patient to DC to home  No TOC needs           Patient Goals and CMS Choice      Discharge Placement                         Discharge Plan and Services Additional resources added to the After Visit Summary for                                       Social Determinants of Health (SDOH) Interventions SDOH Screenings   Food Insecurity: No Food Insecurity (12/13/2022)  Housing: Low Risk  (12/13/2022)  Transportation Needs: No Transportation Needs (12/13/2022)  Utilities: Not At Risk (12/13/2022)  Depression (PHQ2-9): Low Risk  (07/13/2022)  Tobacco Use: Low Risk  (12/14/2022)     Readmission Risk Interventions     No data to display

## 2022-12-16 LAB — CULTURE, BLOOD (ROUTINE X 2): Special Requests: ADEQUATE

## 2022-12-16 LAB — SURGICAL PATHOLOGY

## 2022-12-17 LAB — CULTURE, BLOOD (ROUTINE X 2)

## 2022-12-17 NOTE — Anesthesia Postprocedure Evaluation (Signed)
Anesthesia Post Note  Patient: Jared Park.  Procedure(s) Performed: LAPAROSCOPIC CHOLECYSTECTOMY WITH INTRAOPERATIVE CHOLANGIOGRAM (Abdomen)     Patient location during evaluation: PACU Anesthesia Type: General Level of consciousness: awake and alert Pain management: pain level controlled Vital Signs Assessment: post-procedure vital signs reviewed and stable Respiratory status: spontaneous breathing, nonlabored ventilation and respiratory function stable Cardiovascular status: blood pressure returned to baseline and stable Postop Assessment: no apparent nausea or vomiting Anesthetic complications: no   No notable events documented.  Last Vitals:  Vitals:   12/15/22 0409 12/15/22 0816  BP: 110/69 129/75  Pulse:  75  Resp: 18 18  Temp: 36.4 C 36.7 C  SpO2: 97%     Last Pain:  Vitals:   12/15/22 1019  TempSrc:   PainSc: 0-No pain                 Ayris Carano

## 2022-12-18 LAB — CULTURE, BLOOD (ROUTINE X 2)
Culture: NO GROWTH
Culture: NO GROWTH

## 2023-01-01 ENCOUNTER — Ambulatory Visit: Payer: 59 | Admitting: Physician Assistant

## 2023-03-05 ENCOUNTER — Ambulatory Visit: Payer: 59 | Admitting: Family Medicine

## 2023-03-05 ENCOUNTER — Encounter: Payer: Self-pay | Admitting: Family Medicine

## 2023-03-05 VITALS — BP 122/70 | HR 47 | Temp 97.4°F | Wt 172.0 lb

## 2023-03-05 DIAGNOSIS — E7800 Pure hypercholesterolemia, unspecified: Secondary | ICD-10-CM

## 2023-03-05 DIAGNOSIS — R7309 Other abnormal glucose: Secondary | ICD-10-CM | POA: Diagnosis not present

## 2023-03-05 LAB — BASIC METABOLIC PANEL
BUN: 12 mg/dL (ref 6–23)
CO2: 29 mEq/L (ref 19–32)
Calcium: 9 mg/dL (ref 8.4–10.5)
Chloride: 104 mEq/L (ref 96–112)
Creatinine, Ser: 1.08 mg/dL (ref 0.40–1.50)
GFR: 77.68 mL/min (ref >60.00)
Glucose, Bld: 99 mg/dL (ref 70–99)
Potassium: 3.9 mEq/L (ref 3.5–5.1)
Sodium: 140 mEq/L (ref 135–145)

## 2023-03-05 LAB — LDL CHOLESTEROL, DIRECT: Direct LDL: 113 mg/dL

## 2023-03-05 LAB — HEMOGLOBIN A1C: Hgb A1c MFr Bld: 5.9 % (ref 4.6–6.5)

## 2023-03-05 LAB — LIPID PANEL
Cholesterol: 201 mg/dL — ABNORMAL HIGH (ref 0–200)
HDL: 47.7 mg/dL (ref >39.00)
NonHDL: 152.99
Total CHOL/HDL Ratio: 4
Triglycerides: 225 mg/dL — ABNORMAL HIGH (ref 0.0–149.0)
VLDL: 45 mg/dL — ABNORMAL HIGH (ref 0.0–40.0)

## 2023-03-05 NOTE — Progress Notes (Signed)
Established Patient Office Visit   Subjective:  Patient ID: Jared Rudow., male    DOB: 1965/11/15  Age: 57 y.o. MRN: [id number]  Chief Complaint  Patient presents with   Medical Management of Chronic Issues    Pt is in office recheck lipid and to stop taking his cholesterol medication.     HPI Encounter Diagnoses  Name Primary?   Elevated LDL cholesterol level Yes   Elevated glucose    For follow-up of hospitalization back in mid April which diagnosed with cholecystitis.  Issue resolved after cholecystectomy.  Has been doing well.  He would like to rethink and try a different approach to treating his elevated cholesterol.  He is interested in modifying his diet.  He would like to stop the statin and see if he can bring it down with diet therapy.  He has no family history of vascular disease or diabetes.  He does not smoke.  He has no history of hypertension.  He continues to take his statin but sometimes forgets it.  Last LDL cholesterol was significantly elevated at 170 with an HDL of 150   Review of Systems  Constitutional: Negative.   HENT: Negative.    Eyes:  Negative for blurred vision, discharge and redness.  Respiratory: Negative.    Cardiovascular: Negative.   Gastrointestinal:  Negative for abdominal pain.  Genitourinary: Negative.   Musculoskeletal: Negative.  Negative for myalgias.  Skin:  Negative for rash.  Neurological:  Negative for tingling, loss of consciousness and weakness.  Endo/Heme/Allergies:  Negative for polydipsia.     Current Outpatient Medications:    acetaminophen (TYLENOL) 500 MG tablet, Take 2 tablets (1,000 mg total) by mouth every 6 (six) hours as needed for mild pain (or Fever >/= 101)., Disp: , Rfl:    ibuprofen (MOTRIN IB) 200 MG tablet, Take 3 tablets (600 mg total) by mouth every 8 (eight) hours as needed., Disp: 100 tablet, Rfl: 2   ondansetron (ZOFRAN-ODT) 4 MG disintegrating tablet, Take 1 tablet (4 mg total) by mouth every 8  (eight) hours as needed for nausea or vomiting., Disp: 20 tablet, Rfl: 0   pantoprazole (PROTONIX) 40 MG tablet, Take 1 tablet (40 mg total) by mouth daily., Disp: 180 tablet, Rfl: 0   Objective:     BP 122/70   Pulse (!) 47   Temp (!) 97.4 F (36.3 C)   Wt 172 lb (78 kg)   SpO2 96%   BMI 27.76 kg/m    Physical Exam Constitutional:      General: He is not in acute distress.    Appearance: Normal appearance. He is not ill-appearing, toxic-appearing or diaphoretic.  HENT:     Head: Normocephalic and atraumatic.     Right Ear: External ear normal.     Left Ear: External ear normal.  Eyes:     General: No scleral icterus.       Right eye: No discharge.        Left eye: No discharge.     Extraocular Movements: Extraocular movements intact.     Conjunctiva/sclera: Conjunctivae normal.  Pulmonary:     Effort: Pulmonary effort is normal. No respiratory distress.  Skin:    General: Skin is warm and dry.  Neurological:     Mental Status: He is alert and oriented to person, place, and time.  Psychiatric:        Mood and Affect: Mood normal.        Behavior: Behavior normal.  No results found for any visits on 03/05/23.    The 10-year ASCVD risk score (Arnett DK, et al., 2019) is: 6.3%    Assessment & Plan:   Elevated LDL cholesterol level -     Lipid panel  Elevated glucose -     Basic metabolic panel -     Hemoglobin A1c    Return in about 3 months (around 05/26/2023).  Have discontinued atorvastatin.  He will work harder on a low fat low-cholesterol diet.  Glucose elevated in recent blood work.  Checking A1c.  He needs a physical for his work by the end of September.  Recheck blood work at that time.  Mliss Sax, MD

## 2023-03-22 ENCOUNTER — Ambulatory Visit: Payer: 59 | Admitting: Physician Assistant

## 2023-03-22 ENCOUNTER — Encounter: Payer: Self-pay | Admitting: Physician Assistant

## 2023-03-22 DIAGNOSIS — M7711 Lateral epicondylitis, right elbow: Secondary | ICD-10-CM

## 2023-03-22 MED ORDER — ACETAMINOPHEN-CODEINE 300-30 MG PO TABS: 1.0000 | ORAL_TABLET | Freq: Two times a day (BID) | ORAL | 2 refills | Status: DC | PRN

## 2023-03-22 NOTE — Progress Notes (Signed)
Office Visit Note   Patient: Jared Park.           Date of Birth: 27-Aug-1966           MRN: [id number] Visit Date: 03/22/2023              Requested by: Mliss Sax, MD [address] [place],  [place] [address] PCP: Mliss Sax, MD   Assessment & Plan: Visit Diagnoses:  1. Lateral epicondylitis of right elbow     Plan: Impression is chronic right elbow lateral epicondylitis.  At this point, patient has undergone 2 cortisone injections.  At this point, I recommended MRI to further evaluate for structural abnormalities.  He understands and agrees.  He will follow-up with [place] once this has been completed.  I have agreed to send in Tylenol 3 in the meantime.  Call with concerns or questions.  Follow-Up Instructions: Return for f/u after MRI.   Orders:  No orders of the defined types were placed in this encounter.  No orders of the defined types were placed in this encounter.     Procedures: No procedures performed   Clinical Data: No additional findings.   Subjective: Chief Complaint  Patient presents with   Right Elbow - Pain    HPI patient is a pleasant 57 year old gentleman who comes in today with recurrent right elbow pain.  History of lateral epicondylitis.  He has been seen by [place] twice in the past year where his tennis elbow was injected with cortisone both times.  Last injection was on 4-24.  He had relief for approximately 2-1/2 months.  Symptoms have returned.  All this pain is to the lateral aspect.  Worse when he is reaching for something such as his remote control.  He denies any weakness to the right elbow.  He is recently started utilizing his tennis elbow brace which has provided some relief.  He no longer does the ECRB exercises.  He has been taking Tylenol and Motrin with some relief.  Review of Systems as detailed in HPI.  All others reviewed and are negative.   Objective: Vital Signs: There were no vitals taken for  this visit.  Physical Exam well-developed well-nourished gentleman in no acute distress.  Alert and oriented x 3.  Ortho Exam right elbow exam: Marked tenderness to the lateral epicondyle.  He has increased pain with extension of the long finger against resistance.  He has pain when shaking my hand.  Slight pain with supination.  He is neurovascularly intact distally.  Specialty Comments:  No specialty comments available.  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   Cholelithiasis 08/05/2021   Left flank pain 07/22/2021   Acute medial meniscus tear, right, initial encounter 02/19/2021   Synovitis of knee 02/19/2021   Erectile dysfunction 10/25/2020   Elevated LFTs 06/11/2020   DOE (dyspnea on exertion) 06/11/2020   Elevated LDL cholesterol level 03/12/2020   Cervical strain 03/12/2020   COMMON MIGRAINE 05/17/2007   Past Medical History:  Diagnosis Date   COVID 03/2020   GERD (gastroesophageal reflux disease)  MMT (medial meniscus tear)    right   Nausea & vomiting 12/13/2022    Family History  Problem Relation Age of Onset   Diabetes Mother    Hyperlipidemia Mother    Hypertension Mother    Stroke Father    Healthy Sister    Healthy Brother    Colon polyps Neg Hx    Colon cancer Neg Hx    Esophageal cancer Neg Hx    Rectal cancer Neg Hx    Stomach cancer Neg Hx     Past Surgical History:  Procedure Laterality Date   CHOLECYSTECTOMY N/A 12/14/2022   Procedure: LAPAROSCOPIC CHOLECYSTECTOMY WITH INTRAOPERATIVE CHOLANGIOGRAM;  Surgeon: Abigail Miyamoto, MD;   Location: [facility] OR;  Service: General;  Laterality: N/A;   KNEE ARTHROSCOPY WITH MEDIAL MENISECTOMY Right 02/19/2021   Procedure: RIGHT KNEE ARTHROSCOPY WITH PARTIAL MEDIAL MENISECTOMY;  Surgeon: Tarry Kos, MD;  Location: [facility] OR;  Service: Orthopedics;  Laterality: Right;   WISDOM TOOTH EXTRACTION     age 52   Social History   Occupational History   Not on file  Tobacco Use   Smoking status: Never   Smokeless tobacco: Never  Vaping Use   Vaping status: Never Used  Substance and Sexual Activity   Alcohol use: Not Currently    Comment: none since 03-2020   Drug use: No   Sexual activity: Yes

## 2023-03-22 NOTE — Addendum Note (Signed)
Addended by: Wendi Maya on: 03/22/2023 11:08 AM   Modules accepted: Orders

## 2023-04-11 ENCOUNTER — Ambulatory Visit
Admission: RE | Admit: 2023-04-11 | Discharge: 2023-04-11 | Disposition: A | Discharge status: Home / Self Care | Payer: 59 | Source: Ambulatory Visit | Attending: Physician Assistant | Admitting: Physician Assistant

## 2023-04-11 DIAGNOSIS — M7711 Lateral epicondylitis, right elbow: Secondary | ICD-10-CM

## 2023-04-15 ENCOUNTER — Encounter (INDEPENDENT_AMBULATORY_CARE_PROVIDER_SITE_OTHER): Payer: Self-pay

## 2023-04-20 ENCOUNTER — Ambulatory Visit: Payer: 59 | Admitting: Orthopaedic Surgery

## 2023-04-20 ENCOUNTER — Encounter: Payer: Self-pay | Admitting: Orthopaedic Surgery

## 2023-04-20 DIAGNOSIS — M7711 Lateral epicondylitis, right elbow: Secondary | ICD-10-CM | POA: Diagnosis not present

## 2023-04-20 NOTE — Progress Notes (Signed)
Office Visit Note   Patient: Jared Park.           Date of Birth: 05-Oct-1965           MRN: [id number] Visit Date: 04/20/2023              Requested by: Mliss Sax, MD [address] [place],  [place] [address] PCP: Mliss Sax, MD   Assessment & Plan: Visit Diagnoses:  1. Lateral epicondylitis of right elbow     Plan: MRI of the right elbow shows a partial-thickness tear of the common extensor tendon.  Patient has now had 2 cortisone shots with temporary relief.  He does a very physical job and I think that the likelihood of failure with a third shot is high.  I have recommended surgical debridement and repair of the common extensor tendon.  Risk benefits prognosis reviewed with the patient.  I think it would be beneficial for his wife to take a week off of work to the assist him after surgery.  Follow-Up Instructions: No follow-ups on file.   Orders:  No orders of the defined types were placed in this encounter.  No orders of the defined types were placed in this encounter.     Procedures: No procedures performed   Clinical Data: No additional findings.   Subjective: Chief Complaint  Patient presents with   Right Elbow - Follow-up    MRI review    HPI Patient returns today for MRI review.  Reports no changes in symptoms. Review of Systems  Constitutional: Negative.   HENT: Negative.    Eyes: Negative.   Respiratory: Negative.    Cardiovascular: Negative.   Gastrointestinal: Negative.   Endocrine: Negative.   Genitourinary: Negative.   Skin: Negative.   Allergic/Immunologic: Negative.   Neurological: Negative.   Hematological: Negative.   Psychiatric/Behavioral: Negative.    All other systems reviewed and are negative.    Objective: Vital Signs: There were no vitals taken for this visit.  Physical Exam Vitals and nursing note reviewed.  Constitutional:      Appearance: He is well-developed.  Pulmonary:      Effort: Pulmonary effort is normal.  Abdominal:     Palpations: Abdomen is soft.  Skin:    General: Skin is warm.  Neurological:     Mental Status: He is alert and oriented to person, place, and time.  Psychiatric:        Behavior: Behavior normal.        Thought Content: Thought content normal.        Judgment: Judgment normal.     Ortho Exam Examination of right elbow shows no tenderness to the tip of the olecranon.  Tenderness to the lateral epicondyle and the common extensor tendon. Specialty Comments:  No specialty comments available.  Imaging: No results found.   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   Cholelithiasis 08/05/2021   Left flank pain 07/22/2021   Acute medial meniscus tear, right, initial encounter 02/19/2021   Synovitis of knee 02/19/2021   Erectile dysfunction 10/25/2020   Elevated LFTs 06/11/2020   DOE (dyspnea on  exertion) 06/11/2020   Elevated LDL cholesterol level 03/12/2020   Cervical strain 03/12/2020   COMMON MIGRAINE 05/17/2007   Past Medical History:  Diagnosis Date   COVID 03/2020   GERD (gastroesophageal reflux disease)    MMT (medial meniscus tear)    right   Nausea & vomiting 12/13/2022    Family History  Problem Relation Age of Onset   Diabetes Mother    Hyperlipidemia Mother    Hypertension Mother    Stroke Father    Healthy Sister    Healthy Brother    Colon polyps Neg Hx    Colon cancer Neg Hx    Esophageal cancer Neg Hx    Rectal cancer Neg Hx    Stomach cancer Neg Hx     Past Surgical History:  Procedure Laterality Date    CHOLECYSTECTOMY N/A 12/14/2022   Procedure: LAPAROSCOPIC CHOLECYSTECTOMY WITH INTRAOPERATIVE CHOLANGIOGRAM;  Surgeon: Abigail Miyamoto, MD;  Location: [facility] OR;  Service: General;  Laterality: N/A;   KNEE ARTHROSCOPY WITH MEDIAL MENISECTOMY Right 02/19/2021   Procedure: RIGHT KNEE ARTHROSCOPY WITH PARTIAL MEDIAL MENISECTOMY;  Surgeon: Tarry Kos, MD;  Location: [facility] OR;  Service: Orthopedics;  Laterality: Right;   WISDOM TOOTH EXTRACTION     age 57   Social History   Occupational History   Not on file  Tobacco Use   Smoking status: Never   Smokeless tobacco: Never  Vaping Use   Vaping status: Never Used  Substance and Sexual Activity   Alcohol use: Not Currently    Comment: none since 03-2020   Drug use: No   Sexual activity: Yes

## 2023-04-23 NOTE — Progress Notes (Signed)
F/u with xu to discuss

## 2023-04-27 ENCOUNTER — Other Ambulatory Visit: Payer: Self-pay | Admitting: Physician Assistant

## 2023-04-27 MED ORDER — HYDROCODONE-ACETAMINOPHEN 5-325 MG PO TABS: 1.0000 | ORAL_TABLET | Freq: Three times a day (TID) | ORAL | 0 refills | Status: DC | PRN

## 2023-04-27 MED ORDER — ONDANSETRON HCL 4 MG PO TABS: 4.0000 mg | ORAL_TABLET | Freq: Three times a day (TID) | ORAL | 0 refills | Status: DC | PRN

## 2023-05-05 ENCOUNTER — Telehealth: Payer: Self-pay | Admitting: Orthopaedic Surgery

## 2023-05-05 ENCOUNTER — Other Ambulatory Visit: Payer: Self-pay | Admitting: Physician Assistant

## 2023-05-05 NOTE — Telephone Encounter (Signed)
FMLA forms for patients spouse. To [employer].

## 2023-05-06 DIAGNOSIS — M7711 Lateral epicondylitis, right elbow: Secondary | ICD-10-CM | POA: Diagnosis not present

## 2023-05-13 ENCOUNTER — Ambulatory Visit (INDEPENDENT_AMBULATORY_CARE_PROVIDER_SITE_OTHER): Payer: 59 | Admitting: Physician Assistant

## 2023-05-13 ENCOUNTER — Encounter: Payer: Self-pay | Admitting: Physician Assistant

## 2023-05-13 DIAGNOSIS — M7711 Lateral epicondylitis, right elbow: Secondary | ICD-10-CM

## 2023-05-13 MED ORDER — HYDROCODONE-ACETAMINOPHEN 5-325 MG PO TABS: 1.0000 | ORAL_TABLET | Freq: Three times a day (TID) | ORAL | 0 refills | Status: DC | PRN

## 2023-05-13 NOTE — Progress Notes (Addendum)
Post-Op Visit Note   Patient: Jared Park.           Date of Birth: 05/04/1966           MRN: [id number] Visit Date: 05/13/2023 PCP: Mliss Sax, MD   Assessment & Plan:  Chief Complaint:  Chief Complaint  Patient presents with   Right Elbow - Follow-up    Right tennis elbow release 05/06/2023   Visit Diagnoses:  1. Lateral epicondylitis of right elbow     Plan: Patient is a pleasant 57 year old gentleman who comes in today 1 week status post right tennis elbow release 05/06/2023.  He has been doing well.  He has been taking Norco for pain.  Examination of his right elbow reveals a well-healing surgical incision with nylon sutures in place.  No evidence of infection or cellulitis.  Fingers are warm well-perfused although he does have a fair amount of swelling to the right hand.  Today, his wound was cleaned and recovered.  He was placed in a Velcro splint for which she will wear for the next 5 weeks.  He will follow-up next week for suture removal.  Have sent in a referral for OT to work on range of motion for the next 5 weeks.  At 6 weeks postop, we will progress to strengthening exercises.  I refilled his Norco.  He will call with concerns or questions.  Follow-Up Instructions: Return in about 1 week (around 05/20/2023).   Orders:  Orders Placed This Encounter  Procedures   Ambulatory referral to Occupational Therapy   Meds ordered this encounter  Medications   HYDROcodone-acetaminophen (NORCO) 5-325 MG tablet    Sig: Take 1 tablet by mouth 3 (three) times daily as needed.    Dispense:  20 tablet    Refill:  0    Imaging: No results found.  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/01/2021   Cholelithiasis 08/05/2021   Left flank pain 07/22/2021   Acute medial meniscus tear, right, initial encounter 02/19/2021   Synovitis of knee 02/19/2021   Erectile dysfunction 10/25/2020   Elevated LFTs 06/11/2020   DOE (dyspnea on exertion) 06/11/2020   Elevated LDL cholesterol level 03/12/2020   Cervical strain 03/12/2020   COMMON MIGRAINE 05/17/2007   Past Medical History:  Diagnosis Date   COVID 03/2020   GERD (gastroesophageal reflux disease)    MMT (medial meniscus tear)    right   Nausea & vomiting 12/13/2022    Family History  Problem Relation Age of Onset   Diabetes Mother    Hyperlipidemia Mother    Hypertension Mother    Stroke Father    Healthy Sister    Healthy Brother    Colon polyps Neg Hx    Colon cancer Neg Hx    Esophageal cancer Neg Hx    Rectal cancer Neg Hx    Stomach cancer Neg Hx     Past Surgical History:  Procedure Laterality Date   CHOLECYSTECTOMY N/A 12/14/2022   Procedure: LAPAROSCOPIC CHOLECYSTECTOMY WITH INTRAOPERATIVE CHOLANGIOGRAM;  Surgeon: Abigail Miyamoto, MD;  Location: [facility] OR;  Service: General;  Laterality: N/A;   KNEE  ARTHROSCOPY WITH MEDIAL MENISECTOMY Right 02/19/2021   Procedure: RIGHT KNEE ARTHROSCOPY WITH PARTIAL MEDIAL MENISECTOMY;  Surgeon: Tarry Kos, MD;  Location: [facility] OR;  Service: Orthopedics;  Laterality: Right;   WISDOM TOOTH EXTRACTION     age 55   Social History   Occupational History   Not on file  Tobacco Use   Smoking status: Never   Smokeless tobacco: Never  Vaping Use   Vaping status: Never Used  Substance and Sexual Activity   Alcohol use: Not Currently    Comment: none since 03-2020   Drug use: No   Sexual activity: Yes

## 2023-05-14 ENCOUNTER — Telehealth: Payer: Self-pay | Admitting: Orthopaedic Surgery

## 2023-05-14 NOTE — Telephone Encounter (Signed)
Received call from pts wife, Delores. She has an [employer] accident claim and needs Op note and bill. I advised to contact the Surgical center for the bill. I will have the Op note ready and patient will sign release when he comes in to pick up.

## 2023-05-17 ENCOUNTER — Encounter: Payer: Self-pay | Admitting: Family Medicine

## 2023-05-17 ENCOUNTER — Ambulatory Visit: Payer: 59 | Admitting: Family Medicine

## 2023-05-17 VITALS — BP 118/70 | HR 51 | Temp 97.3°F | Ht 66.0 in | Wt 174.4 lb

## 2023-05-17 DIAGNOSIS — R7309 Other abnormal glucose: Secondary | ICD-10-CM | POA: Diagnosis not present

## 2023-05-17 DIAGNOSIS — Z0001 Encounter for general adult medical examination with abnormal findings: Secondary | ICD-10-CM

## 2023-05-17 DIAGNOSIS — H6123 Impacted cerumen, bilateral: Secondary | ICD-10-CM

## 2023-05-17 DIAGNOSIS — E7800 Pure hypercholesterolemia, unspecified: Secondary | ICD-10-CM

## 2023-05-17 DIAGNOSIS — Z0000 Encounter for general adult medical examination without abnormal findings: Secondary | ICD-10-CM

## 2023-05-17 DIAGNOSIS — Z125 Encounter for screening for malignant neoplasm of prostate: Secondary | ICD-10-CM | POA: Diagnosis not present

## 2023-05-17 DIAGNOSIS — Z23 Encounter for immunization: Secondary | ICD-10-CM | POA: Insufficient documentation

## 2023-05-17 LAB — LIPID PANEL
Cholesterol: 244 mg/dL — ABNORMAL HIGH (ref 0–200)
HDL: 48.9 mg/dL (ref >39.00)
LDL Cholesterol: 158 mg/dL — ABNORMAL HIGH (ref 0–99)
NonHDL: 195.56
Total CHOL/HDL Ratio: 5
Triglycerides: 190 mg/dL — ABNORMAL HIGH (ref 0.0–149.0)
VLDL: 38 mg/dL (ref 0.0–40.0)

## 2023-05-17 LAB — CBC
HCT: 43.9 % (ref 39.0–52.0)
Hemoglobin: 14 g/dL (ref 13.0–17.0)
MCHC: 31.8 g/dL (ref 30.0–36.0)
MCV: 86.5 fl (ref 78.0–100.0)
Platelets: 304 10*3/uL (ref 150.0–400.0)
RBC: 5.08 Mil/uL (ref 4.22–5.81)
RDW: 13.5 % (ref 11.5–15.5)
WBC: 3.5 10*3/uL — ABNORMAL LOW (ref 4.0–10.5)

## 2023-05-17 LAB — URINALYSIS, ROUTINE W REFLEX MICROSCOPIC
Bilirubin Urine: NEGATIVE
Hgb urine dipstick: NEGATIVE
Ketones, ur: NEGATIVE
Leukocytes,Ua: NEGATIVE
Nitrite: NEGATIVE
RBC / HPF: NONE SEEN (ref 0–?)
Specific Gravity, Urine: 1.025 (ref 1.000–1.030)
Total Protein, Urine: NEGATIVE
Urine Glucose: NEGATIVE
Urobilinogen, UA: 0.2 (ref 0.0–1.0)
WBC, UA: NONE SEEN (ref 0–?)
pH: 5.5 (ref 5.0–8.0)

## 2023-05-17 LAB — COMPREHENSIVE METABOLIC PANEL
ALT: 15 U/L (ref 0–53)
AST: 12 U/L (ref 0–37)
Albumin: 3.9 g/dL (ref 3.5–5.2)
Alkaline Phosphatase: 68 U/L (ref 39–117)
BUN: 13 mg/dL (ref 6–23)
CO2: 32 meq/L (ref 19–32)
Calcium: 9.2 mg/dL (ref 8.4–10.5)
Chloride: 102 meq/L (ref 96–112)
Creatinine, Ser: 1.09 mg/dL (ref 0.40–1.50)
GFR: 76.72 mL/min (ref >60.00)
Glucose, Bld: 86 mg/dL (ref 70–99)
Potassium: 4.7 meq/L (ref 3.5–5.1)
Sodium: 141 meq/L (ref 135–145)
Total Bilirubin: 0.8 mg/dL (ref 0.2–1.2)
Total Protein: 6.9 g/dL (ref 6.0–8.3)

## 2023-05-17 LAB — PSA: PSA: 0.65 ng/mL (ref 0.10–4.00)

## 2023-05-17 MED ORDER — EAR WAX REMOVAL KIT 6.5 % OT SOLN: 5.0000 [drp] | Freq: Two times a day (BID) | OTIC | 0 refills | Status: DC

## 2023-05-17 NOTE — Progress Notes (Signed)
Established Patient Office Visit   Subjective:  Patient ID: Jared Park., male    DOB: 1966/05/03  Age: 57 y.o. MRN: [id number]  Chief Complaint  Patient presents with   Medical Management of Chronic Issues    3 month follow up. Pt is fasting. Right meniscus surgery 2 weeks ago.     HPI Encounter Diagnoses  Name Primary?   Healthcare maintenance Yes   Elevated LDL cholesterol level    Elevated glucose    Excessive cerumen in both ear canals    Screening for prostate cancer    Immunization due    Doing well.  Status post recent surgery for lateral epicondylitis of his right elbow.  Doing well.  He is fasting this morning.  He has discontinued atorvastatin.  His mom had diabetes.   Review of Systems  Constitutional: Negative.   HENT: Negative.    Eyes:  Negative for blurred vision, discharge and redness.  Respiratory: Negative.    Cardiovascular: Negative.   Gastrointestinal:  Positive for constipation. Negative for abdominal pain, blood in stool, diarrhea and melena.  Genitourinary: Negative.  Negative for dysuria, frequency, hematuria and urgency.  Musculoskeletal: Negative.  Negative for myalgias.  Skin:  Negative for rash.  Neurological:  Negative for tingling, loss of consciousness and weakness.  Endo/Heme/Allergies:  Negative for polydipsia.      05/17/2023    9:08 AM 03/05/2023    8:46 AM 03/05/2023    8:41 AM  Depression screen PHQ 2/9  Decreased Interest 0 0 0  Down, Depressed, Hopeless 0 0 0  PHQ - 2 Score 0 0 0      Current Outpatient Medications:    acetaminophen (TYLENOL) 500 MG tablet, Take 2 tablets (1,000 mg total) by mouth every 6 (six) hours as needed for mild pain (or Fever >/= 101)., Disp: , Rfl:    acetaminophen-codeine (TYLENOL #3) 300-30 MG tablet, Take 1 tablet by mouth 2 (two) times daily as needed for moderate pain., Disp: 30 tablet, Rfl: 2   carbamide peroxide (EAR WAX REMOVAL KIT) 6.5 % OTIC solution, Place 5 drops into both ears 2  (two) times daily., Disp: 15 mL, Rfl: 0   HYDROcodone-acetaminophen (NORCO) 5-325 MG tablet, Take 1 tablet by mouth 3 (three) times daily as needed., Disp: 20 tablet, Rfl: 0   ibuprofen (MOTRIN IB) 200 MG tablet, Take 3 tablets (600 mg total) by mouth every 8 (eight) hours as needed., Disp: 100 tablet, Rfl: 2   ondansetron (ZOFRAN) 4 MG tablet, Take 1 tablet (4 mg total) by mouth every 8 (eight) hours as needed for nausea or vomiting., Disp: 40 tablet, Rfl: 0   pantoprazole (PROTONIX) 40 MG tablet, Take 1 tablet (40 mg total) by mouth daily., Disp: 180 tablet, Rfl: 0   Objective:     BP 118/70   Pulse (!) 51   Temp (!) 97.3 F (36.3 C)   Ht 5\' 6"  (1.676 m)   Wt 174 lb 6.4 oz (79.1 kg)   SpO2 98%   BMI 28.15 kg/m  BP Readings from Last 3 Encounters:  05/17/23 118/70  03/05/23 122/70  12/15/22 129/75   Wt Readings from Last 3 Encounters:  05/17/23 174 lb 6.4 oz (79.1 kg)  03/05/23 172 lb (78 kg)  12/13/22 169 lb 1.5 oz (76.7 kg)      Physical Exam Constitutional:      General: He is not in acute distress.    Appearance: Normal appearance. He is not ill-appearing, toxic-appearing  or diaphoretic.  HENT:     Head: Normocephalic and atraumatic.     Right Ear: External ear normal. There is impacted cerumen.     Left Ear: Tympanic membrane, ear canal and external ear normal. There is no impacted cerumen.     Mouth/Throat:     Mouth: Mucous membranes are moist.     Pharynx: Oropharynx is clear. No oropharyngeal exudate or posterior oropharyngeal erythema.  Eyes:     General: No scleral icterus.       Right eye: No discharge.        Left eye: No discharge.     Extraocular Movements: Extraocular movements intact.     Conjunctiva/sclera: Conjunctivae normal.     Pupils: Pupils are equal, round, and reactive to light.  Cardiovascular:     Rate and Rhythm: Normal rate and regular rhythm.  Pulmonary:     Effort: Pulmonary effort is normal. No respiratory distress.     Breath  sounds: Normal breath sounds.  Abdominal:     General: Bowel sounds are normal.     Tenderness: There is no abdominal tenderness. There is no guarding or rebound.  Musculoskeletal:     Cervical back: No rigidity or tenderness.  Lymphadenopathy:     Cervical: No cervical adenopathy.  Skin:    General: Skin is warm and dry.  Neurological:     Mental Status: He is alert and oriented to person, place, and time.  Psychiatric:        Mood and Affect: Mood normal.        Behavior: Behavior normal.      No results found for any visits on 05/17/23.    The 10-year ASCVD risk score (Arnett DK, et al., 2019) is: 5.6%    Assessment & Plan:   Healthcare maintenance -     CBC -     Urinalysis, Routine w reflex microscopic  Elevated LDL cholesterol level -     Comprehensive metabolic panel -     Lipid panel  Elevated glucose -     Comprehensive metabolic panel  Excessive cerumen in both ear canals -     Ear Wax Removal Kit; Place 5 drops into both ears 2 (two) times daily.  Dispense: 15 mL; Refill: 0  Screening for prostate cancer -     PSA  Immunization due -     Varicella-zoster vaccine IM    Return in about 6 months (around 11/14/2023).  Continue exercise and weight loss efforts.  Discussed prediabetes and information was given about it.  With his low ASCVD risk score, he would like to avoid a statin.  Information was given about lowering cholesterol in the diet.  Information was given about ceruminosis.  Kit was sent into the pharmacy for him.  He declines the flu vaccine.  Given second shingrix vaccine today.  Information was given about health maintenance and disease prevention.  Mliss Sax, MD

## 2023-05-17 NOTE — Therapy (Signed)
OUTPATIENT OCCUPATIONAL THERAPY ORTHO EVALUATION  Patient Name: Jared Park. MRN: [id number] DOB:13-Jan-1966, 57 y.o., male Today's Date: 05/19/2023  PCP: Nadene Rubins MD REFERRING PROVIDER: Cristie Hem, PA-C   END OF SESSION:  OT End of Session - 05/19/23 1153     Visit Number 1    Number of Visits 10    Date for OT Re-Evaluation 07/02/23    Authorization Type [employer]    OT Start Time 1153    OT Stop Time 1239    OT Time Calculation (min) 46 min    Equipment Utilized During Treatment tubigrip compressive sleeve    Activity Tolerance Patient tolerated treatment well;No increased pain;Patient limited by pain;Patient limited by fatigue    Behavior During Therapy [facility] for tasks assessed/performed             Past Medical History:  Diagnosis Date   COVID 03/2020   GERD (gastroesophageal reflux disease)    MMT (medial meniscus tear)    right   Nausea & vomiting 12/13/2022   Past Surgical History:  Procedure Laterality Date   CHOLECYSTECTOMY N/A 12/14/2022   Procedure: LAPAROSCOPIC CHOLECYSTECTOMY WITH INTRAOPERATIVE CHOLANGIOGRAM;  Surgeon: Abigail Miyamoto, MD;  Location: [facility] OR;  Service: General;  Laterality: N/A;   KNEE ARTHROSCOPY WITH MEDIAL MENISECTOMY Right 02/19/2021   Procedure: RIGHT KNEE ARTHROSCOPY WITH PARTIAL MEDIAL MENISECTOMY;  Surgeon: Tarry Kos, MD;  Location: [facility] OR;  Service: Orthopedics;  Laterality: Right;   WISDOM TOOTH EXTRACTION     age 88   Patient Active Problem List   Diagnosis Date Noted   Elevated glucose 05/17/2023   Screening for prostate cancer 05/17/2023   Excessive cerumen in both ear canals 05/17/2023   Immunization due 05/17/2023   Syncope 12/13/2022   Fever 12/13/2022   Cholecystitis 12/13/2022   Hypokalemia 12/13/2022   Nausea & vomiting 12/13/2022   Total bilirubin, elevated 12/13/2022   GERD (gastroesophageal reflux disease) 12/13/2022   Epigastric abdominal pain 12/13/2022   Other intervertebral disc  degeneration, lumbar region 06/03/2022   Thoracic disc herniation 10/01/2021   Cholelithiasis 08/05/2021   Left flank pain 07/22/2021   Acute medial meniscus tear, right, initial encounter 02/19/2021   Synovitis of knee 02/19/2021   Erectile dysfunction 10/25/2020   Elevated LFTs 06/11/2020   DOE (dyspnea on exertion) 06/11/2020   Elevated LDL cholesterol level 03/12/2020   Cervical strain 03/12/2020   COMMON MIGRAINE 05/17/2007    ONSET DATE: DOS 05/06/23  REFERRING DIAG: M77.11 (ICD-10-CM) - Lateral epicondylitis of right elbow   THERAPY DIAG:  Pain in right arm  Lateral epicondylitis of right elbow  Stiffness of right elbow, not elsewhere classified  Stiffness of right wrist, not elsewhere classified  Muscle weakness (generalized)  Other lack of coordination  Rationale for Evaluation and Treatment: Rehabilitation  SUBJECTIVE:   SUBJECTIVE STATEMENT: ~2 weeks post op now.  He states that he has an appointment tomorrow to remove his sutures and he arrives with a prefabricated wrist cock up on his wrist and Ace wrap wrapped around his forearm and elbow.  He is largely had no pain since the surgery except for some sharp spikes of pain last weekend.  He does admit to not wearing his wrist brace in the night.  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  He states severe limitations in his current ability   PERTINENT HISTORY: "OT eval and treat; ROM only x 6 weeks po and then may begin strengthening exercises; S/p right  tennis elbow release"  PRECAUTIONS: None  RED FLAGS: None   WEIGHT BEARING RESTRICTIONS: Yes no weightbearing through right elbow or arm for at least another month  PAIN:  Are you having pain? None now, but did have up 6-7/10 in past week sharp pain when driving in the car (was not using Rt arm)   FALLS: Has patient fallen in last 6 months? No  LIVING ENVIRONMENT: Lives with: lives with their family Has following equipment at home:  None  PLOF: Independent  PATIENT GOALS: To improve the use of his dominant right arm and return to work  NEXT MD VISIT: 05/20/23 (tomorrow)     OBJECTIVE: (All objective assessments below are from initial evaluation on: 05/19/23 unless otherwise specified.)   HAND DOMINANCE: Right   ADLs: Overall ADLs: States decreased ability to grab, hold household objects, pain and difficulty to open containers, perform FMS tasks (manipulate fasteners on clothing), mild to moderate bathing problems as well.    FUNCTIONAL OUTCOME MEASURES: Eval: Quick DASH 59% impairment today  (Higher % Score  =  More Impairment)     UPPER EXTREMITY ROM     Shoulder to Wrist AROM Right eval  Elbow flexion 114  Elbow extension (-45)  Forearm supination 75  Forearm pronation  65  Wrist flexion 30  Wrist extension 35  (Blank rows = not tested)   Hand AROM Right eval  Full Fist Ability (or Gap to Distal Palmar Crease) Yes but loose  Thumb Opposition  (Kapandji Scale)  8/10  (Blank rows = not tested)   UPPER EXTREMITY MMT:    Eval:  NT at eval due to recent and still healing injuries. Will be tested when appropriate.   MMT Right TBD  Shoulder flexion   Shoulder abduction   Shoulder adduction   Shoulder extension   Shoulder internal rotation   Shoulder external rotation   Middle trapezius   Lower trapezius   Elbow flexion   Elbow extension   Forearm supination   Forearm pronation   Wrist flexion   Wrist extension   Wrist ulnar deviation   Wrist radial deviation   (Blank rows = not tested)  HAND FUNCTION: Eval: Observed weakness in affected Rt hand.  Details will be tested when safe Grip strength Right: TBD lbs, Left: TBD lbs   COORDINATION: Eval: Observed coordination impairments with affected Rt hand.  Details will be gathered to the next few sessions Box and Blocks Test: Rt TBD Blocks today (TBD is [facility])  SENSATION: Eval:  Light touch intact today  EDEMA:   Eval:  Mildly  swollen in Rt elbow and FA proximally today  COGNITION: Eval: Overall cognitive status: WFL for evaluation today   OBSERVATIONS:   Eval: Surgical area looks well-healing, no signs of infection or dehiscence, not overly tender or swollen, his soreness with motion appears to be typical of this stage of healing for the surgery   TODAY'S TREATMENT:  Post-evaluation treatment:  For self-care/safety he was given anatomy education for body structures and functions and also avoidance of grabbing or pushing or pulling with the right fist or arm for at least 3 to 4 weeks.  For his safety he was advised to wear his wrist cock up even in the night, as he states not wearing it recently.  Also to minimize tension on the surgery, OT explains that all exercises should be done with 1 plane of motion and not in combined motions yet, for example he should perform forearm rotations but  not forearm rotations concurrently with wrist flexion and extension.  He states understanding.  He was also educated to perform scar mobilizations 2 days after his stitches comes out as long as his wound is nicely healed and closed.  He was educated not to soak his surgical area and that is fine to wash it and pat it dry now.  He was recommended to put Vaseline over the surgical area as a barrier and to help keep it moisturized.  He was given the following home exercises to perform 4-6 times a day as tolerated with no pain, only performing 1 plane of motion at a time.  He demonstrates these back for the OT with no significant pain and states understanding though he was a bit anxious when performing these today.   Exercises - Standing Elbow Flexion Extension AROM  - 4-6 x daily - 10-15 reps - Turn [employer] Facing Up & Down  - 4-6 x daily - 10-15 reps - Bend and Pull Back Wrist SLOWLY  - 4 x daily - 10-15 reps - "Windshield Wipers"   - 4 x daily - 10-15 reps - Tendon Glides  - 4-6 x daily - 3-5 reps - 2-3 seconds hold - Thumb Opposition   - 4-6 x daily - 10 reps Patient Education - Scar Massage    PATIENT EDUCATION: Education details: See tx section above for details  Person educated: Patient Education method: Verbal Instruction, Teach back, Handouts  Education comprehension: States and demonstrates understanding, Additional Education required    HOME EXERCISE PROGRAM: Access Code: [email or web address] URL: [email or web address] Date: 05/19/2023 Prepared by: Fannie Knee   GOALS: Goals reviewed with patient? Yes   SHORT TERM GOALS: (STG required if POC>30 days) Target Date: 06/04/23  Pt will obtain protective, custom orthotic. Goal status: TBD/PRN  2.  Pt will demo/state understanding of initial HEP to improve pain levels and prerequisite motion. Goal status: INITIAL   LONG TERM GOALS: Target Date: 07/02/23  Pt will improve functional ability by decreased impairment per Quick DASH assessment from 59% to 15% or better, for better quality of life. Goal status: INITIAL  2.  Pt will improve grip strength in Rt hand from unsafe to test to at least 45lbs for functional use at home and in IADLs. Goal status: INITIAL  3.  Pt will improve A/ROM in Rt elbow flex/ext from 114* / (-45*)  to at least 145 / (-10), to have functional motion for tasks like reach and grasp.  Goal status: INITIAL  4.  Pt will improve strength in Rt elbow from unsafe to at least 4+/5 MMT to have increased functional ability to carry out selfcare and higher-level homecare tasks with less difficulty. Goal status: INITIAL  5.  Pt will improve coordination skills in Rt arm, as seen by [facility] score on BBT testing to have increased functional ability to carry out fine motor tasks (fasteners, etc.) and more complex, coordinated IADLs (meal prep, sports, etc.).  Goal status: INITIAL  6.  Pt will decrease pain at worst from 6-7/10 to 2-3/10 or better to have better sleep and occupational participation in daily roles. Goal status:  INITIAL  ASSESSMENT:  CLINICAL IMPRESSION: Patient is a 57 y.o. male who was seen today for occupational therapy evaluation for stiffness, weakness, soreness in right elbow and arm and decreased functional ability following lateral epicondylitis and subsequent surgery.  He will benefit from outpatient occupational therapy to safely increase ability and return to normal quality of life.   PERFORMANCE DEFICITS: in  functional skills including ADLs, IADLs, coordination, dexterity, edema, ROM, strength, pain, fascial restrictions, muscle spasms, Gross motor control, body mechanics, endurance, decreased knowledge of precautions, wound, and UE functional use, cognitive skills including problem solving and safety awareness, and psychosocial skills including coping strategies, environmental adaptation, and habits.   IMPAIRMENTS: are limiting patient from ADLs, IADLs, work, leisure, and social participation.   COMORBIDITIES: may have co-morbidities  that affects occupational performance. Patient will benefit from skilled OT to address above impairments and improve overall function.  MODIFICATION OR ASSISTANCE TO COMPLETE EVALUATION: No modification of tasks or assist necessary to complete an evaluation.  OT OCCUPATIONAL PROFILE AND HISTORY: Problem focused assessment: Including review of records relating to presenting problem.  CLINICAL DECISION MAKING: LOW - limited treatment options, no task modification necessary  REHAB POTENTIAL: Excellent  EVALUATION COMPLEXITY: Low      PLAN:  OT FREQUENCY: 1-2x/week  OT DURATION: 6 weeks through 07/02/23, up to 10 visits as needed  PLANNED INTERVENTIONS: self care/ADL training, therapeutic exercise, therapeutic activity, neuromuscular re-education, manual therapy, scar mobilization, splinting, ultrasound, fluidotherapy, compression bandaging, moist heat, cryotherapy, contrast bath, patient/family education, Re-evaluation, and Dry needling  RECOMMENDED  OTHER SERVICES: none now   CONSULTED AND AGREED WITH PLAN OF CARE: Patient  PLAN FOR NEXT SESSION:  Review HEP and recommendations, check motion and add MH and manual therapy as helpful, start combined motions as tolerated, light ADLs and very light stretches starting at 4-5 weeks post op   Fannie Knee, OTR/L, CHT 05/19/2023, 1:24 PM

## 2023-05-19 ENCOUNTER — Encounter: Payer: Self-pay | Admitting: Rehabilitative and Restorative Service Providers"

## 2023-05-19 ENCOUNTER — Other Ambulatory Visit: Payer: Self-pay

## 2023-05-19 ENCOUNTER — Ambulatory Visit: Payer: 59 | Admitting: Rehabilitative and Restorative Service Providers"

## 2023-05-19 DIAGNOSIS — M79601 Pain in right arm: Secondary | ICD-10-CM

## 2023-05-19 DIAGNOSIS — M7711 Lateral epicondylitis, right elbow: Secondary | ICD-10-CM

## 2023-05-19 DIAGNOSIS — M25631 Stiffness of right wrist, not elsewhere classified: Secondary | ICD-10-CM

## 2023-05-19 DIAGNOSIS — R278 Other lack of coordination: Secondary | ICD-10-CM

## 2023-05-19 DIAGNOSIS — M6281 Muscle weakness (generalized): Secondary | ICD-10-CM

## 2023-05-19 DIAGNOSIS — M25621 Stiffness of right elbow, not elsewhere classified: Secondary | ICD-10-CM

## 2023-05-20 ENCOUNTER — Ambulatory Visit (INDEPENDENT_AMBULATORY_CARE_PROVIDER_SITE_OTHER): Payer: 59 | Admitting: Physician Assistant

## 2023-05-20 DIAGNOSIS — M7711 Lateral epicondylitis, right elbow: Secondary | ICD-10-CM

## 2023-05-20 NOTE — Progress Notes (Signed)
Post-Op Visit Note   Patient: Jared Park.           Date of Birth: [birth year]           MRN: [id number] Visit Date: 05/20/2023 PCP: Mliss Sax, MD   Assessment & Plan:  Chief Complaint:  Chief Complaint  Patient presents with   Right Elbow - Routine Post Op   Visit Diagnoses:  1. Lateral epicondylitis of right elbow     Plan: Patient is a pleasant 57 year old gentleman who comes in today 2 weeks status post right tennis elbow release 05/06/2023.  He has been doing well overall.  He notes 1 instance last Saturday where he had 3 sharp shooting pains.  No injury to proceed this.  He just started OT yesterday.  He has been provided a home exercise program.  He has been wearing a Velcro wrist splint.  Examination of the right elbow reveals a well healed surgical incision with nylon sutures in place.  No evidence of infection or cellulitis.  Fingers warm well-perfused.  Today, sutures were removed and Steri-Strips applied.  Continue wearing his wrist splint at all times other than for hygiene.  Continue with OT.  He will follow-up in 4 weeks for repeat evaluation where we will likely discontinue his wrist splint and progress to strengthening exercises and OT.  Call with concerns or questions.  Follow-Up Instructions: Return in about 4 weeks (around 06/17/2023).   Orders:  No orders of the defined types were placed in this encounter.  No orders of the defined types were placed in this encounter.   Imaging: No results found.  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   Cholelithiasis 08/05/2021   Left flank pain 07/22/2021   Acute medial meniscus tear, right, initial encounter 02/19/2021   Synovitis of knee 02/19/2021   Erectile dysfunction 10/25/2020   Elevated LFTs 06/11/2020   DOE (dyspnea on exertion) 06/11/2020   Elevated LDL cholesterol level 03/12/2020   Cervical strain 03/12/2020   COMMON MIGRAINE 05/17/2007   Past Medical History:  Diagnosis Date   COVID 03/2020   GERD (gastroesophageal reflux disease)    MMT (medial meniscus tear)    right   Nausea & vomiting 12/13/2022    Family History  Problem Relation Age of Onset   Diabetes Mother    Hyperlipidemia Mother    Hypertension Mother    Stroke Father    Healthy Sister    Healthy Brother    Colon polyps Neg Hx    Colon cancer Neg Hx    Esophageal cancer Neg Hx    Rectal cancer Neg Hx    Stomach cancer Neg Hx     Past Surgical History:  Procedure Laterality Date   CHOLECYSTECTOMY N/A 12/14/2022   Procedure: LAPAROSCOPIC CHOLECYSTECTOMY WITH INTRAOPERATIVE CHOLANGIOGRAM;  Surgeon: Abigail Miyamoto, MD;  Location: [facility] OR;  Service: General;  Laterality: N/A;   KNEE ARTHROSCOPY WITH MEDIAL MENISECTOMY Right 02/19/2021   Procedure: RIGHT KNEE ARTHROSCOPY  WITH PARTIAL MEDIAL MENISECTOMY;  Surgeon: Tarry Kos, MD;  Location: [facility] OR;  Service: Orthopedics;  Laterality: Right;   WISDOM TOOTH EXTRACTION     age 65   Social History   Occupational History   Not on file  Tobacco Use   Smoking status: Never   Smokeless tobacco: Never  Vaping Use   Vaping status: Never Used  Substance and Sexual Activity   Alcohol use: Not Currently    Comment: none since 03-2020   Drug use: No   Sexual activity: Yes

## 2023-05-21 ENCOUNTER — Telehealth: Payer: Self-pay | Admitting: Orthopaedic Surgery

## 2023-05-21 NOTE — Telephone Encounter (Signed)
Pt came in to pay $35 for disability paperwork was unsure of amount if it was $25 or $35

## 2023-05-24 NOTE — Therapy (Signed)
OUTPATIENT OCCUPATIONAL THERAPY TREATMENT NOTE  Patient Name: Jared Park. MRN: [id number] DOB:Dec 26, 1965, 57 y.o., male Today's Date: 05/26/2023  PCP: Nadene Rubins MD REFERRING PROVIDER: Cristie Hem, PA-C   END OF SESSION:  OT End of Session - 05/26/23 1144     Visit Number 2    Number of Visits 10    Date for OT Re-Evaluation 07/02/23    Authorization Type [employer]    OT Start Time 1144    OT Stop Time 1233    OT Time Calculation (min) 49 min    Equipment Utilized During Treatment --    Activity Tolerance Patient tolerated treatment well;No increased pain;Patient limited by pain;Patient limited by fatigue    Behavior During Therapy [facility] for tasks assessed/performed              Past Medical History:  Diagnosis Date   COVID 03/2020   GERD (gastroesophageal reflux disease)    MMT (medial meniscus tear)    right   Nausea & vomiting 12/13/2022   Past Surgical History:  Procedure Laterality Date   CHOLECYSTECTOMY N/A 12/14/2022   Procedure: LAPAROSCOPIC CHOLECYSTECTOMY WITH INTRAOPERATIVE CHOLANGIOGRAM;  Surgeon: Abigail Miyamoto, MD;  Location: [facility] OR;  Service: General;  Laterality: N/A;   KNEE ARTHROSCOPY WITH MEDIAL MENISECTOMY Right 02/19/2021   Procedure: RIGHT KNEE ARTHROSCOPY WITH PARTIAL MEDIAL MENISECTOMY;  Surgeon: Tarry Kos, MD;  Location: [facility] OR;  Service: Orthopedics;  Laterality: Right;   WISDOM TOOTH EXTRACTION     age 19   Patient Active Problem List   Diagnosis Date Noted   Elevated glucose 05/17/2023   Screening for prostate cancer 05/17/2023   Excessive cerumen in both ear canals 05/17/2023   Immunization due 05/17/2023   Syncope 12/13/2022   Fever 12/13/2022   Cholecystitis 12/13/2022   Hypokalemia 12/13/2022   Nausea & vomiting 12/13/2022   Total bilirubin, elevated 12/13/2022   GERD (gastroesophageal reflux disease) 12/13/2022   Epigastric abdominal pain 12/13/2022   Other intervertebral disc degeneration, lumbar region  06/03/2022   Thoracic disc herniation 10/01/2021   Cholelithiasis 08/05/2021   Left flank pain 07/22/2021   Acute medial meniscus tear, right, initial encounter 02/19/2021   Synovitis of knee 02/19/2021   Erectile dysfunction 10/25/2020   Elevated LFTs 06/11/2020   DOE (dyspnea on exertion) 06/11/2020   Elevated LDL cholesterol level 03/12/2020   Cervical strain 03/12/2020   COMMON MIGRAINE 05/17/2007    ONSET DATE: DOS 05/06/23  REFERRING DIAG: M77.11 (ICD-10-CM) - Lateral epicondylitis of right elbow   THERAPY DIAG:  Lateral epicondylitis of right elbow  Stiffness of right wrist, not elsewhere classified  Stiffness of right elbow, not elsewhere classified  Muscle weakness (generalized)  Pain in right arm  Other lack of coordination  Rationale for Evaluation and Treatment: Rehabilitation  PERTINENT HISTORY: "OT eval and treat; ROM only x 6 weeks po and then may begin strengthening exercises; S/p right tennis elbow release"  He states that he has an appointment tomorrow to remove his sutures and he arrives with a prefabricated wrist cock up on his wrist and Ace wrap wrapped around his forearm and elbow.  He is largely had no pain since the surgery except for some sharp spikes of pain last weekend.  He does admit to not wearing his wrist brace in the night.  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  He states severe limitations in his current ability  PRECAUTIONS: None  RED FLAGS: None   WEIGHT  BEARING RESTRICTIONS: Yes no weightbearing through right elbow or arm for at least another month    SUBJECTIVE:   SUBJECTIVE STATEMENT: ~3 weeks post op now.  He states having a bit of pain when removing brace and bending and extending elbow since last seen.  Fortunately, it didn't last and has resolved.  He does admit to not removing his wrist brace as asked when performing range of motion for the wrist and so this was not very effective the past week.    PAIN:   Are you having pain?  None now, but did have up 7/10 in past week sharp pain when driving in the car (was not using Rt arm)    PATIENT GOALS: To improve the use of his dominant right arm and return to work  NEXT MD VISIT: 05/20/23 (tomorrow)     OBJECTIVE: (All objective assessments below are from initial evaluation on: 05/19/23 unless otherwise specified.)   HAND DOMINANCE: Right   ADLs: Overall ADLs: States decreased ability to grab, hold household objects, pain and difficulty to open containers, perform FMS tasks (manipulate fasteners on clothing), mild to moderate bathing problems as well.    FUNCTIONAL OUTCOME MEASURES: Eval: Quick DASH 59% impairment today  (Higher % Score  =  More Impairment)     UPPER EXTREMITY ROM     Shoulder to Wrist AROM Right eval Rt 05/26/23  Elbow flexion 114 135  Elbow extension (-45) (-24)  Forearm supination 75 88  Forearm pronation  65 80  Wrist flexion 30 72  Wrist extension 35 35  (Blank rows = not tested)   Hand AROM Right eval  Full Fist Ability (or Gap to Distal Palmar Crease) Yes but loose  Thumb Opposition  (Kapandji Scale)  8/10  (Blank rows = not tested)   UPPER EXTREMITY MMT:    Eval:  NT at eval due to recent and still healing injuries. Will be tested when appropriate.   MMT Right TBD  Shoulder flexion   Shoulder abduction   Shoulder adduction   Shoulder extension   Shoulder internal rotation   Shoulder external rotation   Middle trapezius   Lower trapezius   Elbow flexion   Elbow extension   Forearm supination   Forearm pronation   Wrist flexion   Wrist extension   Wrist ulnar deviation   Wrist radial deviation   (Blank rows = not tested)  HAND FUNCTION: Eval: Observed weakness in affected Rt hand.  Details will be tested when safe Grip strength Right: TBD lbs, Left: TBD lbs   COORDINATION: TBD: Box and Blocks Test: Rt TBD Blocks today (TBD is [facility])  Eval: Observed coordination impairments with  affected Rt hand.  Details will be gathered to the next few sessions   EDEMA:   Eval:  Mildly swollen in Rt elbow and FA proximally today  OBSERVATIONS:   Eval: Surgical area looks well-healing, no signs of infection or dehiscence, not overly tender or swollen, his soreness with motion appears to be typical of this stage of healing for the surgery (right tennis elbow release)   TODAY'S TREATMENT:  05/26/23: He performs range of motion for new measures which shows excellent improvement everywhere but the wrist, likely because he did not remove his wrist brace to perform exercises as asked.  His Steri-Strips are all off but to which are hanging loosely, so OT removes these and does manual therapy scar mobilization with a light lotion while reminding him how to do these things.  Next,  as wrist extension is limited actively, OT does teach light wrist extension stretch to be done passively which she feels no pain with but a stretch through the volar side of his forearm.  He was also reviewing his other home exercise program after 5 minutes of moist heat, and OT encouraging him to do his wrist exercises and finger exercises now without the wrist brace on.  He does wrist flexion extension as well as tendon glides and thumb opposition for 10 times each without a brace on and tolerates this well with some tension.  He was also educated that he can be doing some combination motions later on this week as long as he is not having too much tension or pain, and OT demonstrates this and he demonstrates back.  He leaves with no increase in pain and stating understanding all directions.  For his safety again he was recommended no lifting pushing or pulling and to wear his wrist brace when not doing exercises.  Add wrist ext stretch     Post-evaluation treatment:  For self-care/safety he was given anatomy education for body structures and functions and also avoidance of grabbing or pushing or pulling with the right  fist or arm for at least 3 to 4 weeks.  For his safety he was advised to wear his wrist cock up even in the night, as he states not wearing it recently.  Also to minimize tension on the surgery, OT explains that all exercises should be done with 1 plane of motion and not in combined motions yet, for example he should perform forearm rotations but not forearm rotations concurrently with wrist flexion and extension.  He states understanding.  He was also educated to perform scar mobilizations 2 days after his stitches comes out as long as his wound is nicely healed and closed.  He was educated not to soak his surgical area and that is fine to wash it and pat it dry now.  He was recommended to put Vaseline over the surgical area as a barrier and to help keep it moisturized.  He was given the following home exercises to perform 4-6 times a day as tolerated with no pain, only performing 1 plane of motion at a time.  He demonstrates these back for the OT with no significant pain and states understanding though he was a bit anxious when performing these today.   Exercises - Standing Elbow Flexion Extension AROM  - 4-6 x daily - 10-15 reps - Turn [employer] Facing Up & Down  - 4-6 x daily - 10-15 reps - Bend and Pull Back Wrist SLOWLY  - 4 x daily - 10-15 reps - "Windshield Wipers"   - 4 x daily - 10-15 reps - Tendon Glides  - 4-6 x daily - 3-5 reps - 2-3 seconds hold - Thumb Opposition  - 4-6 x daily - 10 reps Patient Education - Scar Massage    PATIENT EDUCATION: Education details: See tx section above for details  Person educated: Patient Education method: Verbal Instruction, Teach back, Handouts  Education comprehension: States and demonstrates understanding, Additional Education required    HOME EXERCISE PROGRAM: Access Code: [email or web address] URL: [email or web address] Date: 05/19/2023 Prepared by: Fannie Knee   GOALS: Goals reviewed with patient? Yes   SHORT TERM GOALS: (STG  required if POC>30 days) Target Date: 06/04/23  Pt will obtain protective, custom orthotic. Goal status: TBD/PRN  2.  Pt will demo/state understanding of initial HEP to improve pain levels and prerequisite motion. Goal  status: INITIAL   LONG TERM GOALS: Target Date: 07/02/23  Pt will improve functional ability by decreased impairment per Quick DASH assessment from 59% to 15% or better, for better quality of life. Goal status: INITIAL  2.  Pt will improve grip strength in Rt hand from unsafe to test to at least 45lbs for functional use at home and in IADLs. Goal status: INITIAL  3.  Pt will improve A/ROM in Rt elbow flex/ext from 114* / (-45*)  to at least 145 / (-10), to have functional motion for tasks like reach and grasp.  Goal status: INITIAL  4.  Pt will improve strength in Rt elbow from unsafe to at least 4+/5 MMT to have increased functional ability to carry out selfcare and higher-level homecare tasks with less difficulty. Goal status: INITIAL  5.  Pt will improve coordination skills in Rt arm, as seen by [facility] score on BBT testing to have increased functional ability to carry out fine motor tasks (fasteners, etc.) and more complex, coordinated IADLs (meal prep, sports, etc.).  Goal status: INITIAL  6.  Pt will decrease pain at worst from 6-7/10 to 2-3/10 or better to have better sleep and occupational participation in daily roles. Goal status: INITIAL  ASSESSMENT:  CLINICAL IMPRESSION: 05/26/23: Although he did not remove his wrist brace as asked, he has made good elbow and forearm motion gains.  He has also been relatively safe.  OT has encouraged his wrist and hand motion today and perhaps next week he will tolerate new light activities and upgraded stretches.  Carry on  Eval: Patient is a 57 y.o. male who was seen today for occupational therapy evaluation for stiffness, weakness, soreness in right elbow and arm and decreased functional ability following lateral  epicondylitis and subsequent surgery.  He will benefit from outpatient occupational therapy to safely increase ability and return to normal quality of life.     PLAN:  OT FREQUENCY: 1-2x/week  OT DURATION: 6 weeks through 07/02/23, up to 10 visits as needed  PLANNED INTERVENTIONS: self care/ADL training, therapeutic exercise, therapeutic activity, neuromuscular re-education, manual therapy, scar mobilization, splinting, ultrasound, fluidotherapy, compression bandaging, moist heat, cryotherapy, contrast bath, patient/family education, Re-evaluation, and Dry needling  RECOMMENDED OTHER SERVICES: none now   CONSULTED AND AGREED WITH PLAN OF CARE: Patient  PLAN FOR NEXT SESSION:  light ADLs and very light stretches starting at 4-5 weeks post op  Fannie Knee, OTR/L, CHT 05/26/2023, 12:39 PM

## 2023-05-26 ENCOUNTER — Encounter: Payer: Self-pay | Admitting: Rehabilitative and Restorative Service Providers"

## 2023-05-26 ENCOUNTER — Ambulatory Visit: Payer: 59 | Admitting: Rehabilitative and Restorative Service Providers"

## 2023-05-26 DIAGNOSIS — M25621 Stiffness of right elbow, not elsewhere classified: Secondary | ICD-10-CM

## 2023-05-26 DIAGNOSIS — M25631 Stiffness of right wrist, not elsewhere classified: Secondary | ICD-10-CM | POA: Diagnosis not present

## 2023-05-26 DIAGNOSIS — M7711 Lateral epicondylitis, right elbow: Secondary | ICD-10-CM | POA: Diagnosis not present

## 2023-05-26 DIAGNOSIS — M6281 Muscle weakness (generalized): Secondary | ICD-10-CM | POA: Diagnosis not present

## 2023-05-26 DIAGNOSIS — M79601 Pain in right arm: Secondary | ICD-10-CM

## 2023-05-26 DIAGNOSIS — R278 Other lack of coordination: Secondary | ICD-10-CM

## 2023-05-28 NOTE — Therapy (Signed)
OUTPATIENT OCCUPATIONAL THERAPY TREATMENT NOTE  Patient Name: Jared Park. MRN: [id number] DOB:[birth year], 57 y.o., male Today's Date: 05/28/2023  PCP: Nadene Rubins MD REFERRING PROVIDER: Cristie Hem, PA-C   END OF SESSION:     Past Medical History:  Diagnosis Date   COVID 03/2020   GERD (gastroesophageal reflux disease)    MMT (medial meniscus tear)    right   Nausea & vomiting 12/13/2022   Past Surgical History:  Procedure Laterality Date   CHOLECYSTECTOMY N/A 12/14/2022   Procedure: LAPAROSCOPIC CHOLECYSTECTOMY WITH INTRAOPERATIVE CHOLANGIOGRAM;  Surgeon: Abigail Miyamoto, MD;  Location: [facility] OR;  Service: General;  Laterality: N/A;   KNEE ARTHROSCOPY WITH MEDIAL MENISECTOMY Right 02/19/2021   Procedure: RIGHT KNEE ARTHROSCOPY WITH PARTIAL MEDIAL MENISECTOMY;  Surgeon: Tarry Kos, MD;  Location: [facility] OR;  Service: Orthopedics;  Laterality: Right;   WISDOM TOOTH EXTRACTION     age 89   Patient Active Problem List   Diagnosis Date Noted   Elevated glucose 05/17/2023   Screening for prostate cancer 05/17/2023   Excessive cerumen in both ear canals 05/17/2023   Immunization due 05/17/2023   Syncope 12/13/2022   Fever 12/13/2022   Cholecystitis 12/13/2022   Hypokalemia 12/13/2022   Nausea & vomiting 12/13/2022   Total bilirubin, elevated 12/13/2022   GERD (gastroesophageal reflux disease) 12/13/2022   Epigastric abdominal pain 12/13/2022   Other intervertebral disc degeneration, lumbar region 06/03/2022   Thoracic disc herniation 10/01/2021   Cholelithiasis 08/05/2021   Left flank pain 07/22/2021   Acute medial meniscus tear, right, initial encounter 02/19/2021   Synovitis of knee 02/19/2021   Erectile dysfunction 10/25/2020   Elevated LFTs 06/11/2020   DOE (dyspnea on exertion) 06/11/2020   Elevated LDL cholesterol level 03/12/2020   Cervical strain 03/12/2020   COMMON MIGRAINE 05/17/2007    ONSET DATE: DOS 05/06/23  REFERRING DIAG: M77.11  (ICD-10-CM) - Lateral epicondylitis of right elbow   THERAPY DIAG:  No diagnosis found.  Rationale for Evaluation and Treatment: Rehabilitation  PERTINENT HISTORY: "OT eval and treat; ROM only x 6 weeks po and then may begin strengthening exercises; S/p right tennis elbow release"  He states that he has an appointment tomorrow to remove his sutures and he arrives with a prefabricated wrist cock up on his wrist and Ace wrap wrapped around his forearm and elbow.  He is largely had no pain since the surgery except for some sharp spikes of pain last weekend.  He does admit to not wearing his wrist brace in the night.  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  He states severe limitations in his current ability  PRECAUTIONS: None  RED FLAGS: None   WEIGHT BEARING RESTRICTIONS: Yes no weightbearing through right elbow or arm for at least another month    SUBJECTIVE:   SUBJECTIVE STATEMENT: ~4 weeks post op now.  He states ***  having a bit of pain when removing brace and bending and extending elbow since last seen.  Fortunately, it didn't last and has resolved.  He does admit to not removing his wrist brace as asked when performing range of motion for the wrist and so this was not very effective the past week.    PAIN:  Are you having pain? *** None now, but did have up 7/10 in past week sharp pain when driving in the car (was not using Rt arm)    PATIENT GOALS: To improve the use of his dominant right arm and return  to work  NEXT MD VISIT: 05/20/23 (tomorrow)     OBJECTIVE: (All objective assessments below are from initial evaluation on: 05/19/23 unless otherwise specified.)   HAND DOMINANCE: Right   ADLs: Overall ADLs: States decreased ability to grab, hold household objects, pain and difficulty to open containers, perform FMS tasks (manipulate fasteners on clothing), mild to moderate bathing problems as well.    FUNCTIONAL OUTCOME MEASURES: Eval: Quick DASH 59%  impairment today  (Higher % Score  =  More Impairment)     UPPER EXTREMITY ROM     Shoulder to Wrist AROM Right eval Rt 05/26/23 Rt 06/02/23  Elbow flexion 114 135 ***  Elbow extension (-45) (-24) ***  Forearm supination 75 88   Forearm pronation  65 80   Wrist flexion 30 72   Wrist extension 35 35 ***  (Blank rows = not tested)   Hand AROM Right eval  Full Fist Ability (or Gap to Distal Palmar Crease) Yes but loose  Thumb Opposition  (Kapandji Scale)  8/10  (Blank rows = not tested)   UPPER EXTREMITY MMT:    Eval:  NT at eval due to recent and still healing injuries. Will be tested when appropriate.   MMT Right TBD  Shoulder flexion   Shoulder abduction   Shoulder adduction   Shoulder extension   Shoulder internal rotation   Shoulder external rotation   Middle trapezius   Lower trapezius   Elbow flexion   Elbow extension   Forearm supination   Forearm pronation   Wrist flexion   Wrist extension   Wrist ulnar deviation   Wrist radial deviation   (Blank rows = not tested)  HAND FUNCTION: Eval: Observed weakness in affected Rt hand.  Details will be tested when safe Grip strength Right: TBD lbs, Left: TBD lbs   COORDINATION: TBD: Box and Blocks Test: Rt TBD Blocks today (TBD is [facility])  Eval: Observed coordination impairments with affected Rt hand.  Details will be gathered to the next few sessions   EDEMA:   Eval:  Mildly swollen in Rt elbow and FA proximally today  OBSERVATIONS:   Eval: Surgical area looks well-healing, no signs of infection or dehiscence, not overly tender or swollen, his soreness with motion appears to be typical of this stage of healing for the surgery (right tennis elbow release)   TODAY'S TREATMENT:  06/02/23: *** light ADLs and very light stretches starting at 4-5 weeks post op  05/26/23: He performs range of motion for new measures which shows excellent improvement everywhere but the wrist, likely because he did not remove his  wrist brace to perform exercises as asked.  His Steri-Strips are all off but to which are hanging loosely, so OT removes these and does manual therapy scar mobilization with a light lotion while reminding him how to do these things.  Next, as wrist extension is limited actively, OT does teach light wrist extension stretch to be done passively which she feels no pain with but a stretch through the volar side of his forearm.  He was also reviewing his other home exercise program after 5 minutes of moist heat, and OT encouraging him to do his wrist exercises and finger exercises now without the wrist brace on.  He does wrist flexion extension as well as tendon glides and thumb opposition for 10 times each without a brace on and tolerates this well with some tension.  He was also educated that he can be doing some combination motions later  on this week as long as he is not having too much tension or pain, and OT demonstrates this and he demonstrates back.  He leaves with no increase in pain and stating understanding all directions.  For his safety again he was recommended no lifting pushing or pulling and to wear his wrist brace when not doing exercises.  Add wrist ext stretch     Post-evaluation treatment:  For self-care/safety he was given anatomy education for body structures and functions and also avoidance of grabbing or pushing or pulling with the right fist or arm for at least 3 to 4 weeks.  For his safety he was advised to wear his wrist cock up even in the night, as he states not wearing it recently.  Also to minimize tension on the surgery, OT explains that all exercises should be done with 1 plane of motion and not in combined motions yet, for example he should perform forearm rotations but not forearm rotations concurrently with wrist flexion and extension.  He states understanding.  He was also educated to perform scar mobilizations 2 days after his stitches comes out as long as his wound is nicely  healed and closed.  He was educated not to soak his surgical area and that is fine to wash it and pat it dry now.  He was recommended to put Vaseline over the surgical area as a barrier and to help keep it moisturized.  He was given the following home exercises to perform 4-6 times a day as tolerated with no pain, only performing 1 plane of motion at a time.  He demonstrates these back for the OT with no significant pain and states understanding though he was a bit anxious when performing these today.   Exercises - Standing Elbow Flexion Extension AROM  - 4-6 x daily - 10-15 reps - Turn [employer] Facing Up & Down  - 4-6 x daily - 10-15 reps - Bend and Pull Back Wrist SLOWLY  - 4 x daily - 10-15 reps - "Windshield Wipers"   - 4 x daily - 10-15 reps - Tendon Glides  - 4-6 x daily - 3-5 reps - 2-3 seconds hold - Thumb Opposition  - 4-6 x daily - 10 reps Patient Education - Scar Massage    PATIENT EDUCATION: Education details: See tx section above for details  Person educated: Patient Education method: Verbal Instruction, Teach back, Handouts  Education comprehension: States and demonstrates understanding, Additional Education required    HOME EXERCISE PROGRAM: Access Code: [email or web address] URL: [email or web address] Date: 05/19/2023 Prepared by: Fannie Knee   GOALS: Goals reviewed with patient? Yes   SHORT TERM GOALS: (STG required if POC>30 days) Target Date: 06/04/23  Pt will obtain protective, custom orthotic. Goal status: TBD/PRN  2.  Pt will demo/state understanding of initial HEP to improve pain levels and prerequisite motion. Goal status: INITIAL   LONG TERM GOALS: Target Date: 07/02/23  Pt will improve functional ability by decreased impairment per Quick DASH assessment from 59% to 15% or better, for better quality of life. Goal status: INITIAL  2.  Pt will improve grip strength in Rt hand from unsafe to test to at least 45lbs for functional use at home and  in IADLs. Goal status: INITIAL  3.  Pt will improve A/ROM in Rt elbow flex/ext from 114* / (-45*)  to at least 145 / (-10), to have functional motion for tasks like reach and grasp.  Goal status: INITIAL  4.  Pt will improve strength  in Rt elbow from unsafe to at least 4+/5 MMT to have increased functional ability to carry out selfcare and higher-level homecare tasks with less difficulty. Goal status: INITIAL  5.  Pt will improve coordination skills in Rt arm, as seen by [facility] score on BBT testing to have increased functional ability to carry out fine motor tasks (fasteners, etc.) and more complex, coordinated IADLs (meal prep, sports, etc.).  Goal status: INITIAL  6.  Pt will decrease pain at worst from 6-7/10 to 2-3/10 or better to have better sleep and occupational participation in daily roles. Goal status: INITIAL  ASSESSMENT:  CLINICAL IMPRESSION: 06/02/23: ***  05/26/23: Although he did not remove his wrist brace as asked, he has made good elbow and forearm motion gains.  He has also been relatively safe.  OT has encouraged his wrist and hand motion today and perhaps next week he will tolerate new light activities and upgraded stretches.  Carry on  Eval: Patient is a 57 y.o. male who was seen today for occupational therapy evaluation for stiffness, weakness, soreness in right elbow and arm and decreased functional ability following lateral epicondylitis and subsequent surgery.  He will benefit from outpatient occupational therapy to safely increase ability and return to normal quality of life.     PLAN:  OT FREQUENCY: 1-2x/week  OT DURATION: 6 weeks through 07/02/23, up to 10 visits as needed  PLANNED INTERVENTIONS: self care/ADL training, therapeutic exercise, therapeutic activity, neuromuscular re-education, manual therapy, scar mobilization, splinting, ultrasound, fluidotherapy, compression bandaging, moist heat, cryotherapy, contrast bath, patient/family education, Re-evaluation,  and Dry needling  RECOMMENDED OTHER SERVICES: none now   CONSULTED AND AGREED WITH PLAN OF CARE: Patient  PLAN FOR NEXT SESSION:  ***  Fannie Knee, OTR/L, CHT 05/28/2023, 10:07 AM

## 2023-06-02 ENCOUNTER — Ambulatory Visit: Payer: 59 | Admitting: Rehabilitative and Restorative Service Providers"

## 2023-06-02 ENCOUNTER — Encounter: Payer: Self-pay | Admitting: Rehabilitative and Restorative Service Providers"

## 2023-06-02 DIAGNOSIS — M25621 Stiffness of right elbow, not elsewhere classified: Secondary | ICD-10-CM | POA: Diagnosis not present

## 2023-06-02 DIAGNOSIS — M25631 Stiffness of right wrist, not elsewhere classified: Secondary | ICD-10-CM

## 2023-06-02 DIAGNOSIS — R278 Other lack of coordination: Secondary | ICD-10-CM

## 2023-06-02 DIAGNOSIS — M7711 Lateral epicondylitis, right elbow: Secondary | ICD-10-CM

## 2023-06-02 DIAGNOSIS — M79601 Pain in right arm: Secondary | ICD-10-CM

## 2023-06-02 DIAGNOSIS — M6281 Muscle weakness (generalized): Secondary | ICD-10-CM

## 2023-06-04 NOTE — Therapy (Signed)
OUTPATIENT OCCUPATIONAL THERAPY TREATMENT NOTE  Patient Name: Jared Park. MRN: [id number] DOB:Jul 14, 1966, 57 y.o., male Today's Date: 06/09/2023  PCP: Nadene Rubins MD REFERRING PROVIDER: Cristie Hem, PA-C   END OF SESSION:  OT End of Session - 06/09/23 1152     Visit Number 4    Number of Visits 10    Date for OT Re-Evaluation 07/02/23    Authorization Type [employer]    OT Start Time 1152    OT Stop Time 1234    OT Time Calculation (min) 42 min    Activity Tolerance Patient tolerated treatment well;No increased pain;Patient limited by pain;Patient limited by fatigue    Behavior During Therapy [facility] for tasks assessed/performed                Past Medical History:  Diagnosis Date   COVID 03/2020   GERD (gastroesophageal reflux disease)    MMT (medial meniscus tear)    right   Nausea & vomiting 12/13/2022   Past Surgical History:  Procedure Laterality Date   CHOLECYSTECTOMY N/A 12/14/2022   Procedure: LAPAROSCOPIC CHOLECYSTECTOMY WITH INTRAOPERATIVE CHOLANGIOGRAM;  Surgeon: Abigail Miyamoto, MD;  Location: [facility] OR;  Service: General;  Laterality: N/A;   KNEE ARTHROSCOPY WITH MEDIAL MENISECTOMY Right 02/19/2021   Procedure: RIGHT KNEE ARTHROSCOPY WITH PARTIAL MEDIAL MENISECTOMY;  Surgeon: Tarry Kos, MD;  Location: [facility] OR;  Service: Orthopedics;  Laterality: Right;   WISDOM TOOTH EXTRACTION     age 40   Patient Active Problem List   Diagnosis Date Noted   Elevated glucose 05/17/2023   Screening for prostate cancer 05/17/2023   Excessive cerumen in both ear canals 05/17/2023   Immunization due 05/17/2023   Syncope 12/13/2022   Fever 12/13/2022   Cholecystitis 12/13/2022   Hypokalemia 12/13/2022   Nausea & vomiting 12/13/2022   Total bilirubin, elevated 12/13/2022   GERD (gastroesophageal reflux disease) 12/13/2022   Epigastric abdominal pain 12/13/2022   Other intervertebral disc degeneration, lumbar region 06/03/2022   Thoracic disc herniation  10/01/2021   Cholelithiasis 08/05/2021   Left flank pain 07/22/2021   Acute medial meniscus tear, right, initial encounter 02/19/2021   Synovitis of knee 02/19/2021   Erectile dysfunction 10/25/2020   Elevated LFTs 06/11/2020   DOE (dyspnea on exertion) 06/11/2020   Elevated LDL cholesterol level 03/12/2020   Cervical strain 03/12/2020   COMMON MIGRAINE 05/17/2007    ONSET DATE: DOS 05/06/23  REFERRING DIAG: M77.11 (ICD-10-CM) - Lateral epicondylitis of right elbow   THERAPY DIAG:  Lateral epicondylitis of right elbow  Stiffness of right wrist, not elsewhere classified  Stiffness of right elbow, not elsewhere classified  Other lack of coordination  Muscle weakness (generalized)  Pain in right arm  Rationale for Evaluation and Treatment: Rehabilitation  PERTINENT HISTORY: "OT eval and treat; ROM only x 6 weeks po and then may begin strengthening exercises; S/p right tennis elbow release"  He states that he has an appointment tomorrow to remove his sutures and he arrives with a prefabricated wrist cock up on his wrist and Ace wrap wrapped around his forearm and elbow.  He is largely had no pain since the surgery except for some sharp spikes of pain last weekend.  He does admit to not wearing his wrist brace in the night.  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  He states pain has been low recently.    PAIN:  Are you having pain?  None now   PATIENT GOALS: To improve the use of his dominant right arm and return to work  NEXT MD VISIT: 05/20/23 (tomorrow)     OBJECTIVE: (All objective assessments below are from initial evaluation on: 05/19/23 unless otherwise specified.)   HAND DOMINANCE: Right    ADLs: Overall ADLs: States decreased ability to grab, hold household objects, pain and difficulty to open containers, perform FMS tasks (manipulate fasteners on clothing), mild to moderate bathing problems as well.    FUNCTIONAL OUTCOME MEASURES: Eval: Quick DASH 59% impairment today  (Higher % Score  =  More Impairment)     UPPER EXTREMITY ROM     Shoulder to Wrist AROM Right eval Rt 05/26/23 Rt 06/02/23 Rt 06/09/23  Elbow flexion 114 135 137   Elbow extension (-45) (-24) (-11)   Forearm supination 75 88    Forearm pronation  65 80    Wrist flexion 30 72 70 73  Wrist extension 35 35 40 38  (Blank rows = not tested)   Hand AROM Right eval  Full Fist Ability (or Gap to Distal Palmar Crease) Yes but loose  Thumb Opposition  (Kapandji Scale)  8/10  (Blank rows = not tested)   UPPER EXTREMITY MMT:    Eval:  NT at eval due to recent and still healing injuries. Will be tested when appropriate.   MMT Right TBD  Shoulder flexion   Shoulder abduction   Shoulder adduction   Shoulder extension   Shoulder internal rotation   Shoulder external rotation   Middle trapezius   Lower trapezius   Elbow flexion   Elbow extension   Forearm supination   Forearm pronation   Wrist flexion   Wrist extension   Wrist ulnar deviation   Wrist radial deviation   (Blank rows = not tested)  HAND FUNCTION: Eval: Observed weakness in affected Rt hand.  Details will be tested when safe Grip strength Right: TBD lbs, Left: TBD lbs   COORDINATION: 06/02/23: Box and Blocks Test: Rt 46 Blocks today (TBD is WFL)  Eval: Observed coordination impairments with affected Rt hand.  Details will be gathered to the next few sessions   EDEMA:   Eval:  Mildly swollen in Rt elbow and FA proximally today  OBSERVATIONS:   Eval: Surgical area looks well-healing, no signs of infection or dehiscence, not overly tender or swollen, his soreness with motion appears to be typical of this stage of healing for  the surgery (right tennis elbow release)   TODAY'S TREATMENT:  06/09/23: He starts with active range of motion measures which show only mild improvements now.  Fortunately he has not had any significant pains recently and he has been consistent with wearing his prefabricated brace for support.  OT spends time discussing the process of weaning from this brace which should happen starting next week at 6 weeks postop.  He should wean from it for 15 to 30 minutes 3-5 times a day for light activities around the house that do not hurt him.  At 7 weeks he should wean 30 to 60 minutes at a time in a similar fashion, so that by 8 weeks he should not need to wear it typically around the house.  OT reviews his home exercises with him  and performs manual therapy IASTM, scar mobilization with negative pressure cupping, and manual stretches for forearm and wrist.  He seems to have most of his tension in his wrist now which may be from prolonged use of wrist brace, and so OT does joint mobilizations to the wrist which she finds minorly helpful.  Weaning from the brace will also help with some of his wrist stiffness.  He is in agreement that he should not need to come for the next 2 weeks as long as he does not have a problem, and then he can return on the eighth week postop to learn strengthening techniques.  He was told several times to call or come in sooner if he has any questions or concerns.  He does see his doctor next week   PATIENT EDUCATION: Education details: See tx section above for details  Person educated: Patient Education method: Verbal Instruction, Teach back, Handouts  Education comprehension: States and demonstrates understanding, Additional Education required    HOME EXERCISE PROGRAM: Access Code: [email or web address] URL: [email or web address] Date: 05/19/2023 Prepared by: Fannie Knee   GOALS: Goals reviewed with patient? Yes   SHORT TERM GOALS: (STG required if POC>30  days) Target Date: 06/04/23  Pt will obtain protective, custom orthotic. Goal status: TBD/PRN  2.  Pt will demo/state understanding of initial HEP to improve pain levels and prerequisite motion. Goal status: INITIAL   LONG TERM GOALS: Target Date: 07/02/23  Pt will improve functional ability by decreased impairment per Quick DASH assessment from 59% to 15% or better, for better quality of life. Goal status: INITIAL  2.  Pt will improve grip strength in Rt hand from unsafe to test to at least 45lbs for functional use at home and in IADLs. Goal status: INITIAL  3.  Pt will improve A/ROM in Rt elbow flex/ext from 114* / (-45*)  to at least 145 / (-10), to have functional motion for tasks like reach and grasp.  Goal status: INITIAL  4.  Pt will improve strength in Rt elbow from unsafe to at least 4+/5 MMT to have increased functional ability to carry out selfcare and higher-level homecare tasks with less difficulty. Goal status: INITIAL  5.  Pt will improve coordination skills in Rt arm, as seen by [facility] score on BBT testing to have increased functional ability to carry out fine motor tasks (fasteners, etc.) and more complex, coordinated IADLs (meal prep, sports, etc.).  Goal status: INITIAL  6.  Pt will decrease pain at worst from 6-7/10 to 2-3/10 or better to have better sleep and occupational participation in daily roles. Goal status: INITIAL  ASSESSMENT:  CLINICAL IMPRESSION: 06/09/23: Doing well, little to no pain typically now, we will start the weaning process from his brace progressively and cautiously.  Will return in 2 weeks to start strengthening at 8 weeks postop  06/02/23: He is now doing proper stretches and tolerating them well, also tolerating light functional activity outside of his brace, but this should be done very carefully and not aggressively and nonrepetitively.  Carry on    PLAN:  OT FREQUENCY: 1-2x/week  OT DURATION: 6 weeks through 07/02/23, up to 10 visits  as needed  PLANNED INTERVENTIONS: self care/ADL training, therapeutic exercise, therapeutic activity, neuromuscular re-education, manual therapy, scar mobilization, splinting, ultrasound, fluidotherapy, compression bandaging, moist heat, cryotherapy, contrast bath, patient/family education, Re-evaluation, and Dry needling  RECOMMENDED OTHER SERVICES: none now   CONSULTED AND AGREED WITH PLAN OF CARE: Patient  PLAN FOR NEXT SESSION:  See back  again in 2 weeks at 8 weeks postop for initiation of eccentric strength training.  Check Dr.'s notes from last follow-up  Fannie Knee, OTR/L, CHT 06/09/2023, 12:43 PM

## 2023-06-09 ENCOUNTER — Encounter: Payer: Self-pay | Admitting: Rehabilitative and Restorative Service Providers"

## 2023-06-09 ENCOUNTER — Ambulatory Visit (INDEPENDENT_AMBULATORY_CARE_PROVIDER_SITE_OTHER): Payer: 59 | Admitting: Rehabilitative and Restorative Service Providers"

## 2023-06-09 DIAGNOSIS — M7711 Lateral epicondylitis, right elbow: Secondary | ICD-10-CM | POA: Diagnosis not present

## 2023-06-09 DIAGNOSIS — M6281 Muscle weakness (generalized): Secondary | ICD-10-CM

## 2023-06-09 DIAGNOSIS — M25621 Stiffness of right elbow, not elsewhere classified: Secondary | ICD-10-CM

## 2023-06-09 DIAGNOSIS — R278 Other lack of coordination: Secondary | ICD-10-CM | POA: Diagnosis not present

## 2023-06-09 DIAGNOSIS — M79601 Pain in right arm: Secondary | ICD-10-CM

## 2023-06-09 DIAGNOSIS — M25631 Stiffness of right wrist, not elsewhere classified: Secondary | ICD-10-CM | POA: Diagnosis not present

## 2023-06-16 ENCOUNTER — Encounter: Payer: 59 | Admitting: Rehabilitative and Restorative Service Providers"

## 2023-06-17 ENCOUNTER — Ambulatory Visit: Payer: 59 | Admitting: Orthopaedic Surgery

## 2023-06-17 DIAGNOSIS — M7711 Lateral epicondylitis, right elbow: Secondary | ICD-10-CM

## 2023-06-17 NOTE — Progress Notes (Signed)
Post-Op Visit Note   Patient: Jared Park.           Date of Birth: [birth year]           MRN: [id number] Visit Date: 06/17/2023 PCP: Mliss Sax, MD   Assessment & Plan:  Chief Complaint:  Chief Complaint  Patient presents with   Right Elbow - Follow-up   Visit Diagnoses:  1. Lateral epicondylitis of right elbow     Plan: [name] is 6 weeks status post right tennis elbow release.  He is doing well reports no pain.  He has progressed very well with OT.  Exam of the right elbow shows fully healed surgical scar.  Range of motion of the elbow is progressing very nicely.  No scar tenderness.  No signs of infection.  At this point we can discontinue the wrist brace.  He can start strengthening with OT.  Recheck in 6 weeks.  He will remain out of work until follow-up appointment.  Follow-Up Instructions: Return in about 6 weeks (around 07/29/2023) for with lindsey.   Orders:  No orders of the defined types were placed in this encounter.  No orders of the defined types were placed in this encounter.   Imaging: No results found.  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   Cholelithiasis 08/05/2021   Left flank pain 07/22/2021   Acute medial meniscus tear, right, initial encounter 02/19/2021   Synovitis of knee 02/19/2021   Erectile dysfunction 10/25/2020   Elevated LFTs 06/11/2020   DOE (dyspnea on exertion) 06/11/2020    Elevated LDL cholesterol level 03/12/2020   Cervical strain 03/12/2020   COMMON MIGRAINE 05/17/2007   Past Medical History:  Diagnosis Date   COVID 03/2020   GERD (gastroesophageal reflux disease)    MMT (medial meniscus tear)    right   Nausea & vomiting 12/13/2022    Family History  Problem Relation Age of Onset   Diabetes Mother    Hyperlipidemia Mother    Hypertension Mother    Stroke Father    Healthy Sister    Healthy Brother    Colon polyps Neg Hx    Colon cancer Neg Hx    Esophageal cancer Neg Hx    Rectal cancer Neg Hx    Stomach cancer Neg Hx     Past Surgical History:  Procedure Laterality Date   CHOLECYSTECTOMY N/A 12/14/2022   Procedure: LAPAROSCOPIC CHOLECYSTECTOMY WITH INTRAOPERATIVE CHOLANGIOGRAM;  Surgeon: Abigail Miyamoto, MD;  Location: [facility] OR;  Service: General;  Laterality: N/A;   KNEE ARTHROSCOPY WITH MEDIAL MENISECTOMY Right 02/19/2021   Procedure: RIGHT KNEE ARTHROSCOPY WITH PARTIAL MEDIAL MENISECTOMY;  Surgeon: Tarry Kos, MD;  Location: [facility] OR;  Service: Orthopedics;  Laterality: Right;   WISDOM TOOTH EXTRACTION     age 24   Social History   Occupational History   Not on file  Tobacco Use   Smoking status: Never   Smokeless tobacco: Never  Vaping Use   Vaping status:  Never Used  Substance and Sexual Activity   Alcohol use: Not Currently    Comment: none since 03-2020   Drug use: No   Sexual activity: Yes

## 2023-06-23 ENCOUNTER — Encounter: Payer: 59 | Admitting: Rehabilitative and Restorative Service Providers"

## 2023-06-28 NOTE — Therapy (Signed)
OUTPATIENT OCCUPATIONAL THERAPY TREATMENT & PROGRESS NOTE  Patient Name: Jared Park. MRN: [id number] DOB:01-01-1966, 57 y.o., male Today's Date: 06/30/2023  PCP: Nadene Rubins MD REFERRING PROVIDER: Cristie Hem, PA-C       Progress Note  Reporting Period 05/19/23 to 06/30/23.   See note below for Objective Data and Assessment of Progress/Goals.  06/30/23: He has no significant pain since last seen, is now tolerating wrist and arm strengthening well.  We have also reviewed his stretches that he should continue doing as he has some tightness from healing and admits to not doing his stretches over the past week.  Overall he is improving but he has not met all of his goals yet so we will request additional therapy today.  Hopefully he will be able to discharge in another 4-6 weeks.        END OF SESSION:  OT End of Session - 06/30/23 1151     Visit Number 5    Number of Visits 10    Date for OT Re-Evaluation 07/30/23    Authorization Type [employer]    OT Start Time 1152    OT Stop Time 1235    OT Time Calculation (min) 43 min    Activity Tolerance Patient tolerated treatment well;No increased pain;Patient limited by fatigue    Behavior During Therapy [facility] for tasks assessed/performed               Past Medical History:  Diagnosis Date   COVID 03/2020   GERD (gastroesophageal reflux disease)    MMT (medial meniscus tear)    right   Nausea & vomiting 12/13/2022   Past Surgical History:  Procedure Laterality Date   CHOLECYSTECTOMY N/A 12/14/2022   Procedure: LAPAROSCOPIC CHOLECYSTECTOMY WITH INTRAOPERATIVE CHOLANGIOGRAM;  Surgeon: Abigail Miyamoto, MD;  Location: [facility] OR;  Service: General;  Laterality: N/A;   KNEE ARTHROSCOPY WITH MEDIAL MENISECTOMY Right 02/19/2021   Procedure: RIGHT KNEE ARTHROSCOPY WITH PARTIAL MEDIAL MENISECTOMY;  Surgeon: Tarry Kos, MD;  Location: [facility] OR;  Service: Orthopedics;  Laterality: Right;   WISDOM TOOTH EXTRACTION     age 37    Patient Active Problem List   Diagnosis Date Noted   Elevated glucose 05/17/2023   Screening for prostate cancer 05/17/2023   Excessive cerumen in both ear canals 05/17/2023   Immunization due 05/17/2023   Syncope 12/13/2022   Fever 12/13/2022   Cholecystitis 12/13/2022   Hypokalemia 12/13/2022   Nausea & vomiting 12/13/2022   Total bilirubin, elevated 12/13/2022   GERD (gastroesophageal reflux disease) 12/13/2022   Epigastric abdominal pain 12/13/2022   Other intervertebral disc degeneration, lumbar region 06/03/2022   Thoracic disc herniation 10/01/2021   Cholelithiasis 08/05/2021   Left flank pain 07/22/2021   Acute medial meniscus tear, right, initial encounter 02/19/2021   Synovitis of knee 02/19/2021   Erectile dysfunction 10/25/2020   Elevated LFTs 06/11/2020   DOE (dyspnea on exertion) 06/11/2020   Elevated LDL cholesterol level 03/12/2020   Cervical strain 03/12/2020   COMMON MIGRAINE 05/17/2007    ONSET DATE: DOS 05/06/23  REFERRING DIAG: M77.11 (ICD-10-CM) - Lateral epicondylitis of right elbow   THERAPY DIAG:  Lateral epicondylitis of right elbow  Stiffness of right wrist, not elsewhere classified  Stiffness of right elbow, not elsewhere classified  Other lack of coordination  Muscle weakness (generalized)  Pain in right arm  Rationale for Evaluation and Treatment: Rehabilitation  PERTINENT HISTORY: "OT eval and treat; ROM only x 6 weeks po and then may  begin strengthening exercises; S/p right tennis elbow release"  He states that he has an appointment tomorrow to remove his sutures and he arrives with a prefabricated wrist cock up on his wrist and Ace wrap wrapped around his forearm and elbow.  He is largely had no pain since the surgery except for some sharp spikes of pain last weekend.  He does admit to not wearing his wrist brace in the night.  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  He states severe limitations in his current  ability  PRECAUTIONS: None  RED FLAGS: None   WEIGHT BEARING RESTRICTIONS: Yes no weightbearing through right elbow or arm for at least another month    SUBJECTIVE:   SUBJECTIVE STATEMENT: 8 weeks post op now.  He states doing well himself the past 2 weeks, but feeling more stiff in the wrist, forgetting to do wrist stretches, but no exacerbations or new pain.  Less tender now.    PAIN:  Are you having pain?   None now   PATIENT GOALS: To improve the use of his dominant right arm and return to work  NEXT MD VISIT: 05/20/23 (tomorrow)     OBJECTIVE: (All objective assessments below are from initial evaluation on: 05/19/23 unless otherwise specified.)   HAND DOMINANCE: Right   ADLs: Overall ADLs: States much less problems with daily routines and tasks, though still with holding heavy work tasks as appropriate.   FUNCTIONAL OUTCOME MEASURES: 06/30/23: Quick DASH 36% impairment today  (Higher % Score  =  More Impairment)    Eval: Quick DASH 59% impairment today  (Higher % Score  =  More Impairment)     UPPER EXTREMITY ROM     Shoulder to Wrist AROM Right eval Rt 05/26/23 Rt 06/02/23 Rt 06/09/23 Rt 06/30/23  Elbow flexion 114 135 137  140  Elbow extension (-45) (-24) (-11)  0  Forearm supination 75 88   90  Forearm pronation  65 80   73  Wrist flexion 30 72 70 73 77  Wrist extension 35 35 40 38 45  (Blank rows = not tested)   Hand AROM Right eval Rt 06/30/23  Full Fist Ability (or Gap to Distal Palmar Crease) Yes but loose Yes!  Thumb Opposition  (Kapandji Scale)  8/10 10  (Blank rows = not tested)   UPPER EXTREMITY MMT:      MMT Right 06/30/23  Elbow flexion 4+/5  Elbow extension 4/5  Forearm supination 4/5  Forearm pronation 4+/5  Wrist flexion 4+/5  Wrist extension 4-/5 tender  (Blank rows = not tested)  HAND FUNCTION: 06/30/23: Grip strength Right: 31 lbs, Left: 96 lbs   Eval: Observed weakness in affected Rt hand.  Details will be tested  when safe  COORDINATION: 06/02/23: Box and Blocks Test: Rt 46 Blocks today (TBD is [facility])  Eval: Observed coordination impairments with affected Rt hand.  Details will be gathered to the next few sessions   EDEMA:   Eval:  Mildly swollen in Rt elbow and FA proximally today  OBSERVATIONS:   Eval: Surgical area looks well-healing, no signs of infection or dehiscence, not overly tender or swollen, his soreness with motion appears to be typical of this stage of healing for the surgery (right tennis elbow release)   TODAY'S TREATMENT:  06/30/23:  Pt performs AROM, gripping, and strength with Rt elbow/wrist/arm against therapist's resistance for exercise/activities as well as new measures today. OT also discusses home and functional tasks with  the pt and reviews goals. Using the complied data, OT also reviews home exercises and provides updated recommendations and upgrades as below, to include new strengthening activities especially eccentric wrist extension. Pt states understanding and tolerates upgrades well, performs back exercises today with no significant pain and leaves in no significant pain.  He was also told to discharge his wrist brace at all times unless doing something that is definitely heavy or repetitive.   Exercises - Tricep Stretch- DO SEATED BY TABLE  - 3-4 x daily - 3-5 reps - 15 hold - Forearm Pronation Stretch  - 3-4 x daily - 3-5 reps - 15 sec hold - Wrist Prayer Stretch  - 4 x daily - 3-5 reps - 15 sec hold - Seated Wrist Flexion Stretch  - 3-4 x daily - 3 reps - 15 second  hold - Standing Bicep Curls with Resistance  - 2-4 x daily - 1-2 sets - 10-15 reps - Seated Eccentric Wrist Extension  - 4-6 x daily - 1 sets - 10-15 reps - Hammer Stretch or Strength   - 2-4 x daily - 1-2 sets - 10-15 reps - Standing Elbow Extension with Self-Anchored Resistance  - 4-6 x daily - 1 sets - 10-15 reps    PATIENT EDUCATION: Education details: See tx section above for details  Person  educated: Patient Education method: Verbal Instruction, Teach back, Handouts  Education comprehension: States and demonstrates understanding, Additional Education required    HOME EXERCISE PROGRAM: Access Code: [email or web address] URL: [email or web address] Date: 05/19/2023 Prepared by: Fannie Knee   GOALS: Goals reviewed with patient? Yes   SHORT TERM GOALS: (STG required if POC>30 days) Target Date: 06/04/23  Pt will obtain protective, custom orthotic. Goal status: TBD/PRN  2.  Pt will demo/state understanding of initial HEP to improve pain levels and prerequisite motion. Goal status: 06/30/23: MET   LONG TERM GOALS: Target Date: 07/30/23  Pt will improve functional ability by decreased impairment per Quick DASH assessment from 59% to 15% or better, for better quality of life. Goal status: 06/30/23: down to 36% problems now   2.  Pt will improve grip strength in Rt hand from unsafe to test to at least 45lbs for functional use at home and in IADLs. Goal status: 06/30/23: now 31#  3.  Pt will improve A/ROM in Rt elbow flex/ext from 114* / (-45*)  to at least 145 / (-10), to have functional motion for tasks like reach and grasp.  Goal status: 06/30/23: now 140*/0*  4.  Pt will improve strength in Rt elbow from unsafe to at least 4+/5 MMT to have increased functional ability to carry out selfcare and higher-level homecare tasks with less difficulty. Goal status: 06/30/23: now 4/5, improving  5.  Pt will improve coordination skills in Rt arm, as seen by [facility] score on BBT testing to have increased functional ability to carry out fine motor tasks (fasteners, etc.) and more complex, coordinated IADLs (meal prep, sports, etc.).  Goal status: 06/30/23: improving, NT today  6.  Pt will decrease pain at worst from 6-7/10 to 2-3/10 or better to have better sleep and occupational participation in daily roles. Goal status: 06/30/23: MET!   ASSESSMENT:  CLINICAL  IMPRESSION: 06/30/23: He has no significant pain since last seen, is now tolerating wrist and arm strengthening well.  We have also reviewed his stretches that he should continue doing as he has some tightness from healing and admits to not doing his stretches over the past week.  Overall  he is improving but he has not met all of his goals yet so we will request additional therapy today.  Hopefully he will be able to discharge in another 4-6 weeks.    PLAN:  OT FREQUENCY: 1x/week  OT DURATION: 6 weeks through 07/30/23, up to 10 total visits as needed  PLANNED INTERVENTIONS: self care/ADL training, therapeutic exercise, therapeutic activity, neuromuscular re-education, manual therapy, scar mobilization, splinting, ultrasound, fluidotherapy, compression bandaging, moist heat, cryotherapy, contrast bath, patient/family education, Re-evaluation, and Dry needling  RECOMMENDED OTHER SERVICES: none now   CONSULTED AND AGREED WITH PLAN OF CARE: Patient  PLAN FOR NEXT SESSION:  Review new strengthening and upgrade to eccentric strengthening to concentric strengthening when tolerated.  Working to resistive functional activities as well   Fannie Knee, OTR/L, CHT 06/30/2023, 5:39 PM

## 2023-06-30 ENCOUNTER — Encounter: Payer: Self-pay | Admitting: Rehabilitative and Restorative Service Providers"

## 2023-06-30 ENCOUNTER — Ambulatory Visit: Payer: 59 | Admitting: Rehabilitative and Restorative Service Providers"

## 2023-06-30 DIAGNOSIS — M7711 Lateral epicondylitis, right elbow: Secondary | ICD-10-CM

## 2023-06-30 DIAGNOSIS — M79601 Pain in right arm: Secondary | ICD-10-CM

## 2023-06-30 DIAGNOSIS — R278 Other lack of coordination: Secondary | ICD-10-CM

## 2023-06-30 DIAGNOSIS — M25631 Stiffness of right wrist, not elsewhere classified: Secondary | ICD-10-CM | POA: Diagnosis not present

## 2023-06-30 DIAGNOSIS — M6281 Muscle weakness (generalized): Secondary | ICD-10-CM | POA: Diagnosis not present

## 2023-06-30 DIAGNOSIS — M25621 Stiffness of right elbow, not elsewhere classified: Secondary | ICD-10-CM | POA: Diagnosis not present

## 2023-07-05 NOTE — Therapy (Signed)
OUTPATIENT OCCUPATIONAL THERAPY TREATMENT NOTE  Patient Name: Jared Park. MRN: [id number] DOB:03/30/1966, 57 y.o., male Today's Date: 07/05/2023  PCP: Nadene Rubins MD REFERRING PROVIDER: Cristie Hem, PA-C    END OF SESSION:      Past Medical History:  Diagnosis Date   COVID 03/2020   GERD (gastroesophageal reflux disease)    MMT (medial meniscus tear)    right   Nausea & vomiting 12/13/2022   Past Surgical History:  Procedure Laterality Date   CHOLECYSTECTOMY N/A 12/14/2022   Procedure: LAPAROSCOPIC CHOLECYSTECTOMY WITH INTRAOPERATIVE CHOLANGIOGRAM;  Surgeon: Abigail Miyamoto, MD;  Location: [facility] OR;  Service: General;  Laterality: N/A;   KNEE ARTHROSCOPY WITH MEDIAL MENISECTOMY Right 02/19/2021   Procedure: RIGHT KNEE ARTHROSCOPY WITH PARTIAL MEDIAL MENISECTOMY;  Surgeon: Tarry Kos, MD;  Location: [facility] OR;  Service: Orthopedics;  Laterality: Right;   WISDOM TOOTH EXTRACTION     age 79   Patient Active Problem List   Diagnosis Date Noted   Elevated glucose 05/17/2023   Screening for prostate cancer 05/17/2023   Excessive cerumen in both ear canals 05/17/2023   Immunization due 05/17/2023   Syncope 12/13/2022   Fever 12/13/2022   Cholecystitis 12/13/2022   Hypokalemia 12/13/2022   Nausea & vomiting 12/13/2022   Total bilirubin, elevated 12/13/2022   GERD (gastroesophageal reflux disease) 12/13/2022   Epigastric abdominal pain 12/13/2022   Other intervertebral disc degeneration, lumbar region 06/03/2022   Thoracic disc herniation 10/01/2021   Cholelithiasis 08/05/2021   Left flank pain 07/22/2021   Acute medial meniscus tear, right, initial encounter 02/19/2021   Synovitis of knee 02/19/2021   Erectile dysfunction 10/25/2020   Elevated LFTs 06/11/2020   DOE (dyspnea on exertion) 06/11/2020   Elevated LDL cholesterol level 03/12/2020   Cervical strain 03/12/2020   COMMON MIGRAINE 05/17/2007    ONSET DATE: DOS 05/06/23  REFERRING DIAG: M77.11  (ICD-10-CM) - Lateral epicondylitis of right elbow   THERAPY DIAG:  No diagnosis found.  Rationale for Evaluation and Treatment: Rehabilitation  PERTINENT HISTORY: "OT eval and treat; ROM only x 6 weeks po and then may begin strengthening exercises; S/p right tennis elbow release"  He states that he has an appointment tomorrow to remove his sutures and he arrives with a prefabricated wrist cock up on his wrist and Ace wrap wrapped around his forearm and elbow.  He is largely had no pain since the surgery except for some sharp spikes of pain last weekend.  He does admit to not wearing his wrist brace in the night.  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  He states ***  doing well himself the past 2 weeks, but feeling more stiff in the wrist, forgetting to do wrist stretches, but no exacerbations or new pain.  Less tender now.    PAIN:  Are you having pain?   *** None now   PATIENT GOALS: To improve the use of his dominant right arm and return to work  NEXT MD VISIT: 05/20/23 (tomorrow)     OBJECTIVE: (All objective assessments below are from initial evaluation on: 05/19/23 unless otherwise specified.)   HAND DOMINANCE: Right   ADLs: Overall ADLs: States much less problems with daily  routines and tasks, though still with holding heavy work tasks as appropriate.   FUNCTIONAL OUTCOME MEASURES: 06/30/23: Quick DASH 36% impairment today  (Higher % Score  =  More Impairment)    Eval: Quick DASH 59% impairment today  (Higher % Score  =  More Impairment)     UPPER EXTREMITY ROM     Shoulder to Wrist AROM Right eval Rt 05/26/23 Rt 06/02/23 Rt 06/09/23 Rt 06/30/23 Rt 07/08/23  Elbow flexion 114 135 137  140    Elbow extension (-45) (-24) (-11)  0   Forearm supination 75 88   90   Forearm pronation  65 80   73   Wrist flexion 30 72 70 73 77 ***  Wrist extension 35 35 40 38 45 ***  (Blank rows = not tested)   Hand AROM Right eval Rt 06/30/23  Full Fist Ability (or Gap to Distal Palmar Crease) Yes but loose Yes!  Thumb Opposition  (Kapandji Scale)  8/10 10  (Blank rows = not tested)   UPPER EXTREMITY MMT:      MMT Right 06/30/23  Elbow flexion 4+/5  Elbow extension 4/5  Forearm supination 4/5  Forearm pronation 4+/5  Wrist flexion 4+/5  Wrist extension 4-/5 tender  (Blank rows = not tested)  HAND FUNCTION: 07/08/23: Grip strength Right: *** lbs  06/30/23: Grip strength Right: 31 lbs, Left: 96 lbs   Eval: Observed weakness in affected Rt hand.  Details will be tested when safe  COORDINATION: 06/02/23: Box and Blocks Test: Rt 46 Blocks today (TBD is [facility])  Eval: Observed coordination impairments with affected Rt hand.  Details will be gathered to the next few sessions   EDEMA:   Eval:  Mildly swollen in Rt elbow and FA proximally today  OBSERVATIONS:   Eval: Surgical area looks well-healing, no signs of infection or dehiscence, not overly tender or swollen, his soreness with motion appears to be typical of this stage of healing for the surgery (right tennis elbow release)   TODAY'S TREATMENT:  07/08/23: ***  Review new strengthening and upgrade to eccentric strengthening to concentric strengthening when tolerated.  Working to resistive functional activities as well   06/30/23:  Pt performs AROM, gripping, and strength with Rt elbow/wrist/arm against therapist's resistance for exercise/activities as well as new measures today. OT also discusses home and functional tasks with the pt and reviews goals. Using the complied data, OT also reviews home exercises and provides updated recommendations and upgrades as below, to include new strengthening activities especially eccentric  wrist extension. Pt states understanding and tolerates upgrades well, performs back exercises today with no significant pain and leaves in no significant pain.  He was also told to discharge his wrist brace at all times unless doing something that is definitely heavy or repetitive.   Exercises - Tricep Stretch- DO SEATED BY TABLE  - 3-4 x daily - 3-5 reps - 15 hold - Forearm Pronation Stretch  - 3-4 x daily - 3-5 reps - 15 sec hold - Wrist Prayer Stretch  - 4 x daily - 3-5 reps - 15 sec hold - Seated Wrist Flexion Stretch  - 3-4 x daily - 3 reps - 15 second  hold - Standing Bicep Curls with Resistance  - 2-4 x daily - 1-2 sets - 10-15 reps - Seated Eccentric Wrist Extension  - 4-6 x daily - 1 sets - 10-15 reps - Hammer Stretch or Strength   - 2-4 x daily - 1-2 sets - 10-15  reps - Standing Elbow Extension with Self-Anchored Resistance  - 4-6 x daily - 1 sets - 10-15 reps    PATIENT EDUCATION: Education details: See tx section above for details  Person educated: Patient Education method: Verbal Instruction, Teach back, Handouts  Education comprehension: States and demonstrates understanding, Additional Education required    HOME EXERCISE PROGRAM: Access Code: [email or web address] URL: [email or web address] Date: 05/19/2023 Prepared by: Fannie Knee   GOALS: Goals reviewed with patient? Yes   SHORT TERM GOALS: (STG required if POC>30 days) Target Date: 06/04/23  Pt will obtain protective, custom orthotic. Goal status: TBD/PRN  2.  Pt will demo/state understanding of initial HEP to improve pain levels and prerequisite motion. Goal status: 06/30/23: MET   LONG TERM GOALS: Target Date: 07/30/23  Pt will improve functional ability by decreased impairment per Quick DASH assessment from 59% to 15% or better, for better quality of life. Goal status: 06/30/23: down to 36% problems now   2.  Pt will improve grip strength in Rt hand from unsafe to test to at least 45lbs for  functional use at home and in IADLs. Goal status: 06/30/23: now 31#  3.  Pt will improve A/ROM in Rt elbow flex/ext from 114* / (-45*)  to at least 145 / (-10), to have functional motion for tasks like reach and grasp.  Goal status: 06/30/23: now 140*/0*  4.  Pt will improve strength in Rt elbow from unsafe to at least 4+/5 MMT to have increased functional ability to carry out selfcare and higher-level homecare tasks with less difficulty. Goal status: 06/30/23: now 4/5, improving  5.  Pt will improve coordination skills in Rt arm, as seen by [facility] score on BBT testing to have increased functional ability to carry out fine motor tasks (fasteners, etc.) and more complex, coordinated IADLs (meal prep, sports, etc.).  Goal status: 06/30/23: improving, NT today  6.  Pt will decrease pain at worst from 6-7/10 to 2-3/10 or better to have better sleep and occupational participation in daily roles. Goal status: 06/30/23: MET!   ASSESSMENT:  CLINICAL IMPRESSION: 07/08/23: ***  06/30/23: He has no significant pain since last seen, is now tolerating wrist and arm strengthening well.  We have also reviewed his stretches that he should continue doing as he has some tightness from healing and admits to not doing his stretches over the past week.  Overall he is improving but he has not met all of his goals yet so we will request additional therapy today.  Hopefully he will be able to discharge in another 4-6 weeks.    PLAN:  OT FREQUENCY: 1x/week  OT DURATION: 6 weeks through 07/30/23, up to 10 total visits as needed  PLANNED INTERVENTIONS: self care/ADL training, therapeutic exercise, therapeutic activity, neuromuscular re-education, manual therapy, scar mobilization, splinting, ultrasound, fluidotherapy, compression bandaging, moist heat, cryotherapy, contrast bath, patient/family education, Re-evaluation, and Dry needling  RECOMMENDED OTHER SERVICES: none now   CONSULTED AND AGREED WITH PLAN OF  CARE: Patient  PLAN FOR NEXT SESSION:  ***   Fannie Knee, OTR/L, CHT 07/05/2023, 12:32 PM

## 2023-07-08 ENCOUNTER — Ambulatory Visit: Payer: 59 | Admitting: Rehabilitative and Restorative Service Providers"

## 2023-07-08 ENCOUNTER — Encounter: Payer: Self-pay | Admitting: Rehabilitative and Restorative Service Providers"

## 2023-07-08 DIAGNOSIS — M25621 Stiffness of right elbow, not elsewhere classified: Secondary | ICD-10-CM

## 2023-07-08 DIAGNOSIS — R278 Other lack of coordination: Secondary | ICD-10-CM | POA: Diagnosis not present

## 2023-07-08 DIAGNOSIS — M7711 Lateral epicondylitis, right elbow: Secondary | ICD-10-CM | POA: Diagnosis not present

## 2023-07-08 DIAGNOSIS — M25631 Stiffness of right wrist, not elsewhere classified: Secondary | ICD-10-CM | POA: Diagnosis not present

## 2023-07-08 DIAGNOSIS — M79601 Pain in right arm: Secondary | ICD-10-CM

## 2023-07-08 DIAGNOSIS — M6281 Muscle weakness (generalized): Secondary | ICD-10-CM

## 2023-07-15 ENCOUNTER — Encounter: Payer: 59 | Admitting: Rehabilitative and Restorative Service Providers"

## 2023-07-22 ENCOUNTER — Encounter: Payer: 59 | Admitting: Rehabilitative and Restorative Service Providers"

## 2023-07-22 ENCOUNTER — Encounter: Payer: Self-pay | Admitting: Physician Assistant

## 2023-07-22 ENCOUNTER — Ambulatory Visit: Payer: 59 | Admitting: Physician Assistant

## 2023-07-22 DIAGNOSIS — M7711 Lateral epicondylitis, right elbow: Secondary | ICD-10-CM

## 2023-07-22 NOTE — Progress Notes (Signed)
Post-Op Visit Note   Patient: Jared Park.           Date of Birth: 1966/04/05           MRN: [id number] Visit Date: 07/22/2023 PCP: Mliss Sax, MD   Assessment & Plan:  Chief Complaint:  Chief Complaint  Patient presents with   Right Elbow - Follow-up, Routine Post Op   Visit Diagnoses:  1. Lateral epicondylitis of right elbow     Plan: Patient is a pleasant 57 year old gentleman who comes in today nearly 3 months status post right tennis elbow release, date of surgery 05/06/2023.  He has been doing well.  Has finished physical therapy.  Examination of his right elbow reveals painless range of motion.  He is neurovascular intact distally.  This point, he is released to full activity.  Follow-up as needed.  Call with concerns or questions.  Follow-Up Instructions: Return if symptoms worsen or fail to improve.   Orders:  No orders of the defined types were placed in this encounter.  No orders of the defined types were placed in this encounter.   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   Cholelithiasis 08/05/2021   Left flank pain 07/22/2021   Acute medial meniscus tear, right, initial encounter 02/19/2021   Synovitis of knee 02/19/2021   Erectile dysfunction 10/25/2020   Elevated LFTs 06/11/2020   DOE (dyspnea on exertion) 06/11/2020   Elevated LDL cholesterol level 03/12/2020   Cervical  strain 03/12/2020   COMMON MIGRAINE 05/17/2007   Past Medical History:  Diagnosis Date   COVID 03/2020   GERD (gastroesophageal reflux disease)    MMT (medial meniscus tear)    right   Nausea & vomiting 12/13/2022    Family History  Problem Relation Age of Onset   Diabetes Mother    Hyperlipidemia Mother    Hypertension Mother    Stroke Father    Healthy Sister    Healthy Brother    Colon polyps Neg Hx    Colon cancer Neg Hx    Esophageal cancer Neg Hx    Rectal cancer Neg Hx    Stomach cancer Neg Hx     Past Surgical History:  Procedure Laterality Date   CHOLECYSTECTOMY N/A 12/14/2022   Procedure: LAPAROSCOPIC CHOLECYSTECTOMY WITH INTRAOPERATIVE CHOLANGIOGRAM;  Surgeon: Abigail Miyamoto, MD;  Location: [facility] OR;  Service: General;  Laterality: N/A;   KNEE ARTHROSCOPY WITH MEDIAL MENISECTOMY Right 02/19/2021   Procedure: RIGHT KNEE ARTHROSCOPY WITH PARTIAL MEDIAL MENISECTOMY;  Surgeon: Tarry Kos, MD;  Location: [facility] OR;  Service: Orthopedics;  Laterality: Right;   WISDOM TOOTH EXTRACTION     age 44   Social History   Occupational History   Not on file  Tobacco Use   Smoking status: Never   Smokeless tobacco: Never  Vaping Use   Vaping status: Never Used  Substance and Sexual Activity   Alcohol use: Not Currently    Comment: none  since 03-2020   Drug use: No   Sexual activity: Yes

## 2023-07-28 ENCOUNTER — Encounter: Payer: 59 | Admitting: Rehabilitative and Restorative Service Providers"

## 2023-08-04 ENCOUNTER — Ambulatory Visit: Payer: 59 | Admitting: Orthopaedic Surgery

## 2023-08-04 ENCOUNTER — Other Ambulatory Visit (INDEPENDENT_AMBULATORY_CARE_PROVIDER_SITE_OTHER): Payer: 59

## 2023-08-04 ENCOUNTER — Encounter: Payer: Self-pay | Admitting: Orthopaedic Surgery

## 2023-08-04 DIAGNOSIS — M25561 Pain in right knee: Secondary | ICD-10-CM | POA: Diagnosis not present

## 2023-08-04 MED ORDER — DICLOFENAC SODIUM 75 MG PO TBEC: 75.0000 mg | DELAYED_RELEASE_TABLET | Freq: Two times a day (BID) | ORAL | 2 refills | Status: DC

## 2023-08-04 NOTE — Progress Notes (Signed)
Office Visit Note   Patient: Jared Park.           Date of Birth: 02/23/1966           MRN: [id number] Visit Date: 08/04/2023              Requested by: Mliss Sax, MD [address] [place],  [place] [address] PCP: Mliss Sax, MD   Assessment & Plan: Visit Diagnoses:  1. Acute pain of right knee     Plan: Patient is a 57 year old gentleman who originally made the appointment for right knee pain.  Impression is osteoarthritis flare.  Currently his symptoms have pretty much all resolved and he is doing fine.  I will send in a prescription for diclofenac in case his pain returns.  Otherwise follow-up as needed.  Follow-Up Instructions: No follow-ups on file.   Orders:  Orders Placed This Encounter  Procedures   XR KNEE 3 VIEW RIGHT   Meds ordered this encounter  Medications   diclofenac (VOLTAREN) 75 MG EC tablet    Sig: Take 1 tablet (75 mg total) by mouth 2 (two) times daily.    Dispense:  30 tablet    Refill:  2      Procedures: No procedures performed   Clinical Data: No additional findings.   Subjective: Chief Complaint  Patient presents with   Right Knee - Pain    HPI Mr. Assi comes in today for evaluation of right knee pain that started about a month ago.  He reports that he may have had a twisting injury but not exactly sure.  He is about 2 to 3 years status post right knee scope for torn medial meniscus.  He is not reporting any mechanical symptoms.  Review of Systems  Constitutional: Negative.   HENT: Negative.    Eyes: Negative.   Respiratory: Negative.    Cardiovascular: Negative.   Gastrointestinal: Negative.   Endocrine: Negative.   Genitourinary: Negative.   Skin: Negative.   Allergic/Immunologic: Negative.   Neurological: Negative.   Hematological: Negative.   Psychiatric/Behavioral: Negative.    All other systems reviewed and are negative.    Objective: Vital Signs: There were no vitals taken  for this visit.  Physical Exam Vitals and nursing note reviewed.  Constitutional:      Appearance: He is well-developed.  HENT:     Head: Normocephalic and atraumatic.  Eyes:     Pupils: Pupils are equal, round, and reactive to light.  Pulmonary:     Effort: Pulmonary effort is normal.  Abdominal:     Palpations: Abdomen is soft.  Musculoskeletal:        General: Normal range of motion.     Cervical back: Neck supple.  Skin:    General: Skin is warm.  Neurological:     Mental Status: He is alert and oriented to person, place, and time.  Psychiatric:        Behavior: Behavior normal.        Thought Content: Thought content normal.        Judgment: Judgment normal.     Ortho Exam Exam of the right knee shows no joint effusion.  Full painless range of motion.  No joint line tenderness. Specialty Comments:  No specialty comments available.  Imaging: XR KNEE 3 VIEW RIGHT  Result Date: 08/04/2023 X-rays of the right knee show mild osteoarthritis.  Well-preserved joint space.  No acute or structural abnormalities.    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   Cholelithiasis 08/05/2021   Left flank pain 07/22/2021   Acute medial meniscus tear, right, initial encounter 02/19/2021   Synovitis of knee 02/19/2021   Erectile dysfunction 10/25/2020   Elevated LFTs 06/11/2020   DOE (dyspnea on exertion) 06/11/2020   Elevated LDL cholesterol  level 03/12/2020   Cervical strain 03/12/2020   COMMON MIGRAINE 05/17/2007   Past Medical History:  Diagnosis Date   COVID 03/2020   GERD (gastroesophageal reflux disease)    MMT (medial meniscus tear)    right   Nausea & vomiting 12/13/2022    Family History  Problem Relation Age of Onset   Diabetes Mother    Hyperlipidemia Mother    Hypertension Mother    Stroke Father    Healthy Sister    Healthy Brother    Colon polyps Neg Hx    Colon cancer Neg Hx    Esophageal cancer Neg Hx    Rectal cancer Neg Hx    Stomach cancer Neg Hx     Past Surgical History:  Procedure Laterality Date   CHOLECYSTECTOMY N/A 12/14/2022   Procedure: LAPAROSCOPIC CHOLECYSTECTOMY WITH INTRAOPERATIVE CHOLANGIOGRAM;  Surgeon: Abigail Miyamoto, MD;  Location: [facility] OR;  Service: General;  Laterality: N/A;   KNEE ARTHROSCOPY WITH MEDIAL MENISECTOMY Right 02/19/2021   Procedure: RIGHT KNEE ARTHROSCOPY WITH PARTIAL MEDIAL MENISECTOMY;  Surgeon: Tarry Kos, MD;  Location: [facility] OR;  Service: Orthopedics;  Laterality: Right;   WISDOM TOOTH EXTRACTION     age 73   Social History   Occupational History   Not on file  Tobacco Use   Smoking status: Never   Smokeless tobacco: Never  Vaping Use   Vaping status: Never Used  Substance and Sexual Activity   Alcohol use: Not Currently    Comment: none since 03-2020   Drug use: No   Sexual activity: Yes

## 2023-12-07 ENCOUNTER — Ambulatory Visit: Admitting: Physician Assistant

## 2024-05-25 ENCOUNTER — Ambulatory Visit: Admitting: Family Medicine

## 2024-05-25 ENCOUNTER — Encounter: Payer: Self-pay | Admitting: Family Medicine

## 2024-05-25 VITALS — BP 122/76 | HR 54 | Temp 96.4°F | Ht 66.0 in | Wt 176.8 lb

## 2024-05-25 DIAGNOSIS — Z0000 Encounter for general adult medical examination without abnormal findings: Secondary | ICD-10-CM

## 2024-05-25 DIAGNOSIS — Z23 Encounter for immunization: Secondary | ICD-10-CM

## 2024-05-25 DIAGNOSIS — E7800 Pure hypercholesterolemia, unspecified: Secondary | ICD-10-CM | POA: Diagnosis not present

## 2024-05-25 DIAGNOSIS — Z125 Encounter for screening for malignant neoplasm of prostate: Secondary | ICD-10-CM | POA: Diagnosis not present

## 2024-05-25 DIAGNOSIS — Z9079 Acquired absence of other genital organ(s): Secondary | ICD-10-CM

## 2024-05-25 DIAGNOSIS — R7309 Other abnormal glucose: Secondary | ICD-10-CM | POA: Diagnosis not present

## 2024-05-25 NOTE — Progress Notes (Addendum)
 Established Patient Office Visit   Subjective:  Patient ID: Jared Park., male    DOB: Feb 08, 1966  Age: 58 y.o. MRN: [id number]  Chief Complaint  Patient presents with   Annual Exam    FASTING Wants Flu vaccine  Due for Shingles and Prevnar vaccines     HPI Encounter Diagnoses  Name Primary?   Healthcare maintenance Yes   Need for influenza vaccination    Screening for prostate cancer    Elevated LDL cholesterol level    Elevated glucose    Immunization due    History of unilateral orchiectomy    Here for physical and follow-up of above.  No regular exercise.  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  He does have regular dental care.  His mom did have diabetes.  He uses what he eats during the winter and strained his back a few weeks ago.  Due for his second Shingrix  vaccine.  History of orchiectomy as a child.   Review of Systems  Constitutional: Negative.   HENT: Negative.    Eyes:  Negative for blurred vision, discharge and redness.  Respiratory: Negative.    Cardiovascular: Negative.   Gastrointestinal:  Negative for abdominal pain.  Genitourinary: Negative.   Musculoskeletal: Negative.  Negative for myalgias.  Skin:  Negative for rash.  Neurological:  Negative for tingling, loss of consciousness and weakness.  Endo/Heme/Allergies:  Negative for polydipsia.      05/25/2024    2:33 PM 05/17/2023    9:08 AM 03/05/2023    8:46 AM  Depression screen PHQ 2/9  Decreased Interest 0 0 0  Down, Depressed, Hopeless 0 0 0  PHQ - 2 Score 0 0 0  Altered sleeping 0    Tired, decreased energy 0    Change in appetite 0    Feeling bad or failure about yourself  0    Trouble concentrating 0    Moving slowly or fidgety/restless 0    Suicidal thoughts 0    PHQ-9 Score 0    Difficult doing work/chores Not difficult at all        Current Outpatient Medications:    acetaminophen  (TYLENOL ) 500 MG tablet, Take 2 tablets (1,000 mg total) by mouth every 6 (six)  hours as needed for mild pain (or Fever >/= 101)., Disp: , Rfl:    Objective:     BP 122/76 (BP Location: Left Arm, Patient Position: Sitting, Cuff Size: Large)   Pulse (!) 54   Temp (!) 96.4 F (35.8 C) (Temporal)   Ht 5' 6 (1.676 m)   Wt 176 lb 12.8 oz (80.2 kg)   SpO2 98%   BMI 28.54 kg/m  Wt Readings from Last 3 Encounters:  05/25/24 176 lb 12.8 oz (80.2 kg)  05/17/23 174 lb 6.4 oz (79.1 kg)  03/05/23 172 lb (78 kg)      Physical Exam Constitutional:      General: He is not in acute distress.    Appearance: Normal appearance. He is not ill-appearing, toxic-appearing or diaphoretic.  HENT:     Head: Normocephalic and atraumatic.     Right Ear: Tympanic membrane, ear canal and external ear normal.     Left Ear: Tympanic membrane, ear canal and external ear normal.     Mouth/Throat:     Mouth: Mucous membranes are moist.     Pharynx: Oropharynx is clear. No oropharyngeal exudate or posterior oropharyngeal erythema.  Eyes:     General:  No scleral icterus.       Right eye: No discharge.        Left eye: No discharge.     Extraocular Movements: Extraocular movements intact.     Conjunctiva/sclera: Conjunctivae normal.     Pupils: Pupils are equal, round, and reactive to light.  Cardiovascular:     Rate and Rhythm: Normal rate and regular rhythm.  Pulmonary:     Effort: Pulmonary effort is normal. No respiratory distress.     Breath sounds: Normal breath sounds.  Abdominal:     General: Bowel sounds are normal.     Tenderness: There is no abdominal tenderness. There is no guarding.     Hernia: There is no hernia in the left inguinal area or right inguinal area.  Genitourinary:    Penis: Circumcised. No hypospadias, erythema, tenderness, discharge, swelling or lesions.      Testes:        Right: Mass, tenderness or swelling not present. Right testis is descended.     Epididymis:     Right: Not inflamed or enlarged.     Left: Not inflamed or enlarged.     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  Lymphadenopathy:     Lower Body: No right inguinal adenopathy. No left inguinal adenopathy.  Skin:    General: Skin is warm and dry.  Neurological:     Mental Status: He is alert and oriented to person, place, and time.  Psychiatric:        Mood and Affect: Mood normal.        Behavior: Behavior normal.      No results found for any visits on 05/25/24.    The 10-year ASCVD risk score (Arnett DK, et al., 2019) is: 6.5%    Assessment & Plan:   Healthcare maintenance -     CBC with Differential/Platelet -     Comprehensive metabolic panel with GFR -     Urinalysis, Routine w reflex microscopic  Need for influenza vaccination -     Flu vaccine trivalent PF, 6mos and older(Flulaval,Afluria,Fluarix,Fluzone)  Screening for prostate cancer -     PSA  Elevated LDL cholesterol level -     Comprehensive metabolic panel with GFR -     Lipid panel  Elevated glucose -     Comprehensive metabolic panel with GFR -     Hemoglobin A1c  Immunization due -     Varicella-zoster vaccine IM  History of unilateral orchiectomy    Return in about 1 year (around 05/25/2025), or if symptoms worsen or fail to improve.  Encouraged regular exercise such as walking for 30 minutes 5 days weekly.  Information given on preventing type 2 diabetes and elevated cholesterol.  Information given on health maintenance and disease prevention.  Demonstrated proper lifting technique.  [name], MD  9/30 addendum: Asked patient to return to the clinic to discuss treatment of his elevated cholesterol and prediabetes.  He refused to return but did say he would cut back on sweets and lower the fat and cholesterol in his diet.

## 2024-05-26 ENCOUNTER — Ambulatory Visit: Payer: Self-pay | Admitting: Family Medicine

## 2024-05-26 LAB — CBC WITH DIFFERENTIAL/PLATELET
Basophils Absolute: 0 K/uL (ref 0.0–0.1)
Basophils Relative: 0.6 % (ref 0.0–3.0)
Eosinophils Absolute: 0 K/uL (ref 0.0–0.7)
Eosinophils Relative: 0.7 % (ref 0.0–5.0)
HCT: 41.7 % (ref 39.0–52.0)
Hemoglobin: 13.4 g/dL (ref 13.0–17.0)
Lymphocytes Relative: 41.3 % (ref 12.0–46.0)
Lymphs Abs: 1.9 K/uL (ref 0.7–4.0)
MCHC: 32.1 g/dL (ref 30.0–36.0)
MCV: 86.1 fl (ref 78.0–100.0)
Monocytes Absolute: 0.3 K/uL (ref 0.1–1.0)
Monocytes Relative: 6.7 % (ref 3.0–12.0)
Neutro Abs: 2.3 K/uL (ref 1.4–7.7)
Neutrophils Relative %: 50.7 % (ref 43.0–77.0)
Platelets: 276 K/uL (ref 150.0–400.0)
RBC: 4.85 Mil/uL (ref 4.22–5.81)
RDW: 14.3 % (ref 11.5–15.5)
WBC: 4.6 K/uL (ref 4.0–10.5)

## 2024-05-26 LAB — URINALYSIS, ROUTINE W REFLEX MICROSCOPIC
Bilirubin Urine: NEGATIVE
Hgb urine dipstick: NEGATIVE
Ketones, ur: NEGATIVE
Leukocytes,Ua: NEGATIVE
Nitrite: NEGATIVE
RBC / HPF: NONE SEEN (ref 0–?)
Specific Gravity, Urine: 1.025 (ref 1.000–1.030)
Total Protein, Urine: NEGATIVE
Urine Glucose: NEGATIVE
Urobilinogen, UA: 1 (ref 0.0–1.0)
WBC, UA: NONE SEEN (ref 0–?)
pH: 6 (ref 5.0–8.0)

## 2024-05-26 LAB — COMPREHENSIVE METABOLIC PANEL WITH GFR
ALT: 17 U/L (ref 0–53)
AST: 14 U/L (ref 0–37)
Albumin: 4.1 g/dL (ref 3.5–5.2)
Alkaline Phosphatase: 69 U/L (ref 39–117)
BUN: 12 mg/dL (ref 6–23)
CO2: 32 meq/L (ref 19–32)
Calcium: 9.3 mg/dL (ref 8.4–10.5)
Chloride: 103 meq/L (ref 96–112)
Creatinine, Ser: 1.05 mg/dL (ref 0.40–1.50)
GFR: 79.66 mL/min (ref >60.00)
Glucose, Bld: 87 mg/dL (ref 70–99)
Potassium: 4.2 meq/L (ref 3.5–5.1)
Sodium: 142 meq/L (ref 135–145)
Total Bilirubin: 0.8 mg/dL (ref 0.2–1.2)
Total Protein: 7.2 g/dL (ref 6.0–8.3)

## 2024-05-26 LAB — LIPID PANEL
Cholesterol: 248 mg/dL — ABNORMAL HIGH (ref 0–200)
HDL: 48.8 mg/dL (ref >39.00)
LDL Cholesterol: 174 mg/dL — ABNORMAL HIGH (ref 0–99)
NonHDL: 199.33
Total CHOL/HDL Ratio: 5
Triglycerides: 128 mg/dL (ref 0.0–149.0)
VLDL: 25.6 mg/dL (ref 0.0–40.0)

## 2024-05-26 LAB — HEMOGLOBIN A1C: Hgb A1c MFr Bld: 6.5 % (ref 4.6–6.5)

## 2024-05-26 LAB — PSA: PSA: 0.48 ng/mL (ref 0.10–4.00)

## 2024-07-03 ENCOUNTER — Encounter: Payer: Self-pay | Admitting: Radiology

## 2024-08-28 ENCOUNTER — Other Ambulatory Visit: Payer: Self-pay

## 2024-08-28 ENCOUNTER — Encounter (HOSPITAL_COMMUNITY): Payer: Self-pay

## 2024-08-28 ENCOUNTER — Emergency Department (HOSPITAL_COMMUNITY)
Admission: EM | Admit: 2024-08-28 | Discharge: 2024-08-28 | Disposition: A | Discharge status: Home / Self Care | Attending: Emergency Medicine | Admitting: Emergency Medicine

## 2024-08-28 DIAGNOSIS — M542 Cervicalgia: Secondary | ICD-10-CM | POA: Insufficient documentation

## 2024-08-28 DIAGNOSIS — Y92410 Unspecified street and highway as the place of occurrence of the external cause: Secondary | ICD-10-CM | POA: Insufficient documentation

## 2024-08-28 MED ORDER — IBUPROFEN 600 MG PO TABS: 600.0000 mg | ORAL_TABLET | Freq: Four times a day (QID) | ORAL | 0 refills | Status: AC | PRN

## 2024-08-28 MED ORDER — CYCLOBENZAPRINE HCL 10 MG PO TABS: 10.0000 mg | ORAL_TABLET | Freq: Two times a day (BID) | ORAL | 0 refills | Status: AC | PRN
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
